# Patient Record
Sex: Female | Born: 1940
Health system: Southern US, Community
[De-identification: ages and names within clinical notes are randomized; demographics above are authoritative.]

## PROBLEM LIST (undated history)

## (undated) DIAGNOSIS — R112 Nausea with vomiting, unspecified: Secondary | ICD-10-CM

## (undated) DIAGNOSIS — Z780 Asymptomatic menopausal state: Secondary | ICD-10-CM

## (undated) DIAGNOSIS — I10 Essential (primary) hypertension: Secondary | ICD-10-CM

## (undated) DIAGNOSIS — T7840XA Allergy, unspecified, initial encounter: Secondary | ICD-10-CM

## (undated) DIAGNOSIS — M810 Age-related osteoporosis without current pathological fracture: Secondary | ICD-10-CM

## (undated) DIAGNOSIS — T8859XA Other complications of anesthesia, initial encounter: Secondary | ICD-10-CM

## (undated) DIAGNOSIS — Z9889 Other specified postprocedural states: Secondary | ICD-10-CM

## (undated) DIAGNOSIS — T4145XA Adverse effect of unspecified anesthetic, initial encounter: Secondary | ICD-10-CM

## (undated) DIAGNOSIS — E7800 Pure hypercholesterolemia, unspecified: Secondary | ICD-10-CM

## (undated) DIAGNOSIS — Z8611 Personal history of tuberculosis: Secondary | ICD-10-CM

## (undated) DIAGNOSIS — A159 Respiratory tuberculosis unspecified: Secondary | ICD-10-CM

## (undated) DIAGNOSIS — M1990 Unspecified osteoarthritis, unspecified site: Secondary | ICD-10-CM

## (undated) HISTORY — DX: Personal history of tuberculosis: Z86.11

## (undated) HISTORY — DX: Unspecified osteoarthritis, unspecified site: M19.90

## (undated) HISTORY — DX: Essential (primary) hypertension: I10

## (undated) HISTORY — PX: LUMBAR DISC SURGERY: SHX700

## (undated) HISTORY — DX: Asymptomatic menopausal state: Z78.0

## (undated) HISTORY — DX: Pure hypercholesterolemia, unspecified: E78.00

## (undated) HISTORY — DX: Allergy, unspecified, initial encounter: T78.40XA

## (undated) HISTORY — PX: SHOULDER SURGERY: SHX246

---

## 1997-12-08 ENCOUNTER — Other Ambulatory Visit: Admission: RE | Admit: 1997-12-08 | Discharge: 1997-12-08 | Payer: Self-pay | Admitting: Gynecology

## 1998-02-27 HISTORY — PX: DILATION AND CURETTAGE OF UTERUS: SHX78

## 1999-03-14 ENCOUNTER — Other Ambulatory Visit: Admission: RE | Admit: 1999-03-14 | Discharge: 1999-03-14 | Payer: Self-pay | Admitting: Gynecology

## 2000-07-02 ENCOUNTER — Other Ambulatory Visit: Admission: RE | Admit: 2000-07-02 | Discharge: 2000-07-02 | Payer: Self-pay | Admitting: Gynecology

## 2001-06-11 ENCOUNTER — Encounter: Payer: Self-pay | Admitting: Neurosurgery

## 2001-06-11 ENCOUNTER — Ambulatory Visit (HOSPITAL_COMMUNITY): Admission: RE | Admit: 2001-06-11 | Discharge: 2001-06-12 | Payer: Self-pay | Admitting: Neurosurgery

## 2001-09-02 ENCOUNTER — Other Ambulatory Visit: Admission: RE | Admit: 2001-09-02 | Discharge: 2001-09-02 | Payer: Self-pay | Admitting: Gynecology

## 2002-10-27 ENCOUNTER — Encounter (HOSPITAL_COMMUNITY): Admission: RE | Admit: 2002-10-27 | Discharge: 2002-11-26 | Payer: Self-pay | Admitting: Neurosurgery

## 2003-07-30 ENCOUNTER — Other Ambulatory Visit: Admission: RE | Admit: 2003-07-30 | Discharge: 2003-07-30 | Payer: Self-pay | Admitting: Family Medicine

## 2004-02-22 ENCOUNTER — Emergency Department (HOSPITAL_COMMUNITY): Admission: EM | Admit: 2004-02-22 | Discharge: 2004-02-22 | Payer: Self-pay | Admitting: Emergency Medicine

## 2004-06-07 ENCOUNTER — Ambulatory Visit: Payer: Self-pay | Admitting: Family Medicine

## 2004-08-08 ENCOUNTER — Other Ambulatory Visit: Admission: RE | Admit: 2004-08-08 | Discharge: 2004-08-08 | Payer: Self-pay | Admitting: Obstetrics and Gynecology

## 2004-09-23 ENCOUNTER — Ambulatory Visit: Payer: Self-pay | Admitting: Family Medicine

## 2004-09-28 ENCOUNTER — Ambulatory Visit: Payer: Self-pay | Admitting: Family Medicine

## 2005-03-21 ENCOUNTER — Ambulatory Visit: Payer: Self-pay | Admitting: Family Medicine

## 2005-03-22 ENCOUNTER — Ambulatory Visit: Payer: Self-pay | Admitting: Internal Medicine

## 2005-05-22 ENCOUNTER — Ambulatory Visit: Payer: Self-pay | Admitting: Family Medicine

## 2005-09-14 ENCOUNTER — Ambulatory Visit: Payer: Self-pay | Admitting: Family Medicine

## 2005-09-21 ENCOUNTER — Encounter: Admission: RE | Admit: 2005-09-21 | Discharge: 2005-09-21 | Payer: Self-pay | Admitting: Family Medicine

## 2005-09-21 ENCOUNTER — Ambulatory Visit: Payer: Self-pay | Admitting: Family Medicine

## 2005-10-11 ENCOUNTER — Ambulatory Visit: Payer: Self-pay | Admitting: Family Medicine

## 2006-01-09 ENCOUNTER — Ambulatory Visit: Payer: Self-pay | Admitting: Family Medicine

## 2006-01-09 LAB — CONVERTED CEMR LAB: ALT: 26 units/L (ref 0–40)

## 2006-01-11 ENCOUNTER — Ambulatory Visit: Payer: Self-pay | Admitting: Internal Medicine

## 2006-01-15 ENCOUNTER — Ambulatory Visit: Payer: Self-pay | Admitting: Family Medicine

## 2006-09-26 ENCOUNTER — Encounter (INDEPENDENT_AMBULATORY_CARE_PROVIDER_SITE_OTHER): Payer: Self-pay | Admitting: *Deleted

## 2006-10-11 DIAGNOSIS — M1990 Unspecified osteoarthritis, unspecified site: Secondary | ICD-10-CM

## 2006-12-06 ENCOUNTER — Ambulatory Visit: Payer: Self-pay | Admitting: Family Medicine

## 2006-12-06 LAB — CONVERTED CEMR LAB
Albumin: 4.2 g/dL (ref 3.5–5.2)
Alkaline Phosphatase: 54 units/L (ref 39–117)
BUN: 10 mg/dL (ref 6–23)
Basophils Absolute: 0 10*3/uL (ref 0.0–0.1)
Basophils Relative: 0.3 % (ref 0.0–1.0)
CO2: 29 meq/L (ref 19–32)
Calcium: 9.8 mg/dL (ref 8.4–10.5)
Cholesterol: 176 mg/dL (ref 0–200)
Creatinine, Ser: 0.7 mg/dL (ref 0.4–1.2)
GFR calc Af Amer: 108 mL/min
Glucose, Bld: 110 mg/dL — ABNORMAL HIGH (ref 70–99)
Glucose, Urine, Semiquant: NEGATIVE
HCT: 39.4 % (ref 36.0–46.0)
HDL: 37.9 mg/dL — ABNORMAL LOW (ref >39.0)
Monocytes Absolute: 0.3 10*3/uL (ref 0.2–0.7)
Monocytes Relative: 8 % (ref 3.0–11.0)
Neutrophils Relative %: 52.2 % (ref 43.0–77.0)
Nitrite: NEGATIVE
RDW: 13.9 % (ref 11.5–14.6)
Sodium: 141 meq/L (ref 135–145)
TSH: 3.84 microintl units/mL (ref 0.35–5.50)
Urobilinogen, UA: 0.2
VLDL: 70 mg/dL — ABNORMAL HIGH (ref 0–40)
WBC Urine, dipstick: NEGATIVE
WBC: 4.1 10*3/uL — ABNORMAL LOW (ref 4.5–10.5)

## 2006-12-13 ENCOUNTER — Ambulatory Visit: Payer: Self-pay | Admitting: Family Medicine

## 2006-12-13 DIAGNOSIS — M810 Age-related osteoporosis without current pathological fracture: Secondary | ICD-10-CM

## 2007-10-16 ENCOUNTER — Ambulatory Visit: Payer: Self-pay | Admitting: Family Medicine

## 2007-10-16 DIAGNOSIS — M7130 Other bursal cyst, unspecified site: Secondary | ICD-10-CM | POA: Insufficient documentation

## 2008-01-14 ENCOUNTER — Ambulatory Visit: Payer: Self-pay | Admitting: Family Medicine

## 2008-01-14 LAB — CONVERTED CEMR LAB
ALT: 37 units/L — ABNORMAL HIGH (ref 0–35)
AST: 31 units/L (ref 0–37)
Albumin: 4.1 g/dL (ref 3.5–5.2)
BUN: 14 mg/dL (ref 6–23)
Basophils Relative: 0 % (ref 0.0–3.0)
Bilirubin, Direct: 0.1 mg/dL (ref 0.0–0.3)
Blood in Urine, dipstick: NEGATIVE
CO2: 31 meq/L (ref 19–32)
Creatinine, Ser: 0.7 mg/dL (ref 0.4–1.2)
Eosinophils Relative: 4.4 % (ref 0.0–5.0)
Glucose, Bld: 107 mg/dL — ABNORMAL HIGH (ref 70–99)
Glucose, Urine, Semiquant: NEGATIVE
HDL: 45.1 mg/dL (ref >39.0)
MCHC: 34.5 g/dL (ref 30.0–36.0)
MCV: 82.9 fL (ref 78.0–100.0)
Neutro Abs: 2.2 10*3/uL (ref 1.4–7.7)
Neutrophils Relative %: 51.7 % (ref 43.0–77.0)
Platelets: 181 10*3/uL (ref 150–400)
Potassium: 3.9 meq/L (ref 3.5–5.1)
Sodium: 144 meq/L (ref 135–145)
TSH: 3.73 microintl units/mL (ref 0.35–5.50)
Total CHOL/HDL Ratio: 4.2
Triglycerides: 372 mg/dL (ref 0–149)
VLDL: 74 mg/dL — ABNORMAL HIGH (ref 0–40)

## 2008-01-22 ENCOUNTER — Ambulatory Visit: Payer: Self-pay | Admitting: Family Medicine

## 2008-01-22 DIAGNOSIS — J309 Allergic rhinitis, unspecified: Secondary | ICD-10-CM | POA: Insufficient documentation

## 2008-03-06 ENCOUNTER — Encounter: Payer: Self-pay | Admitting: Family Medicine

## 2009-01-29 ENCOUNTER — Ambulatory Visit: Payer: Self-pay | Admitting: Family Medicine

## 2009-01-29 LAB — CONVERTED CEMR LAB
Albumin: 4.1 g/dL (ref 3.5–5.2)
Bilirubin, Direct: 0.1 mg/dL (ref 0.0–0.3)
Blood in Urine, dipstick: NEGATIVE
CO2: 29 meq/L (ref 19–32)
Calcium: 9.3 mg/dL (ref 8.4–10.5)
Chloride: 103 meq/L (ref 96–112)
Cholesterol: 188 mg/dL (ref 0–200)
Creatinine, Ser: 0.7 mg/dL (ref 0.4–1.2)
Eosinophils Absolute: 0.1 10*3/uL (ref 0.0–0.7)
HDL: 46.7 mg/dL (ref >39.00)
Hemoglobin: 13.4 g/dL (ref 12.0–15.0)
Lymphocytes Relative: 30 % (ref 12.0–46.0)
MCV: 84.3 fL (ref 78.0–100.0)
Monocytes Absolute: 0.3 10*3/uL (ref 0.1–1.0)
Neutro Abs: 2.8 10*3/uL (ref 1.4–7.7)
Neutrophils Relative %: 61.2 % (ref 43.0–77.0)
Platelets: 192 10*3/uL (ref 150.0–400.0)
Potassium: 3.2 meq/L — ABNORMAL LOW (ref 3.5–5.1)
RBC: 4.74 M/uL (ref 3.87–5.11)
RDW: 13.7 % (ref 11.5–14.6)
Specific Gravity, Urine: 1.015
Total Protein: 8 g/dL (ref 6.0–8.3)
Urobilinogen, UA: 0.2
WBC Urine, dipstick: NEGATIVE
pH: 7.5

## 2009-02-25 ENCOUNTER — Ambulatory Visit: Payer: Self-pay | Admitting: Family Medicine

## 2010-03-14 ENCOUNTER — Other Ambulatory Visit: Payer: Self-pay | Admitting: Family Medicine

## 2010-03-14 ENCOUNTER — Ambulatory Visit
Admission: RE | Admit: 2010-03-14 | Discharge: 2010-03-14 | Payer: Self-pay | Source: Home / Self Care | Attending: Family Medicine | Admitting: Family Medicine

## 2010-03-14 LAB — LDL CHOLESTEROL, DIRECT: Direct LDL: 126.3 mg/dL

## 2010-03-14 LAB — LIPID PANEL
Cholesterol: 218 mg/dL — ABNORMAL HIGH (ref 0–200)
HDL: 55.6 mg/dL (ref >39.00)
Total CHOL/HDL Ratio: 4
Triglycerides: 156 mg/dL — ABNORMAL HIGH (ref 0.0–149.0)
VLDL: 31.2 mg/dL (ref 0.0–40.0)

## 2010-03-14 LAB — CBC WITH DIFFERENTIAL/PLATELET
Basophils Absolute: 0 10*3/uL (ref 0.0–0.1)
Basophils Relative: 0.8 % (ref 0.0–3.0)
Eosinophils Absolute: 0.1 10*3/uL (ref 0.0–0.7)
Eosinophils Relative: 1.7 % (ref 0.0–5.0)
HCT: 41 % (ref 36.0–46.0)
Hemoglobin: 13.9 g/dL (ref 12.0–15.0)
Lymphocytes Relative: 30.4 % (ref 12.0–46.0)
Lymphs Abs: 1.8 10*3/uL (ref 0.7–4.0)
MCHC: 33.8 g/dL (ref 30.0–36.0)
MCV: 83.5 fl (ref 78.0–100.0)
Monocytes Absolute: 0.3 10*3/uL (ref 0.1–1.0)
Monocytes Relative: 5.6 % (ref 3.0–12.0)
Neutro Abs: 3.6 10*3/uL (ref 1.4–7.7)
Neutrophils Relative %: 61.5 % (ref 43.0–77.0)
Platelets: 196 10*3/uL (ref 150.0–400.0)
RBC: 4.9 Mil/uL (ref 3.87–5.11)
RDW: 14.8 % — ABNORMAL HIGH (ref 11.5–14.6)
WBC: 5.8 10*3/uL (ref 4.5–10.5)

## 2010-03-14 LAB — HEPATIC FUNCTION PANEL
ALT: 29 U/L (ref 0–35)
AST: 28 U/L (ref 0–37)
Albumin: 4.4 g/dL (ref 3.5–5.2)
Alkaline Phosphatase: 59 U/L (ref 39–117)
Bilirubin, Direct: 0.1 mg/dL (ref 0.0–0.3)
Total Bilirubin: 1 mg/dL (ref 0.3–1.2)
Total Protein: 7.8 g/dL (ref 6.0–8.3)

## 2010-03-14 LAB — BASIC METABOLIC PANEL
BUN: 17 mg/dL (ref 6–23)
CO2: 29 mEq/L (ref 19–32)
Calcium: 9.6 mg/dL (ref 8.4–10.5)
Chloride: 100 mEq/L (ref 96–112)
Creatinine, Ser: 0.8 mg/dL (ref 0.4–1.2)
GFR: 75.33 mL/min (ref >60.00)
Glucose, Bld: 110 mg/dL — ABNORMAL HIGH (ref 70–99)
Potassium: 4.2 mEq/L (ref 3.5–5.1)
Sodium: 138 mEq/L (ref 135–145)

## 2010-03-14 LAB — TSH: TSH: 2.43 u[IU]/mL (ref 0.35–5.50)

## 2010-03-14 LAB — CONVERTED CEMR LAB
Protein, U semiquant: NEGATIVE
Specific Gravity, Urine: 1.015
Urobilinogen, UA: 0.2

## 2010-03-29 ENCOUNTER — Ambulatory Visit
Admission: RE | Admit: 2010-03-29 | Discharge: 2010-03-29 | Payer: Self-pay | Source: Home / Self Care | Attending: Family Medicine | Admitting: Family Medicine

## 2010-03-29 ENCOUNTER — Encounter: Payer: Self-pay | Admitting: Family Medicine

## 2010-04-06 NOTE — Assessment & Plan Note (Signed)
Summary: cpx//ccm   Vital Signs:  Patient profile:   70 year old female Height:      61.75 inches Weight:      116 pounds BMI:     21.47 Temp:     98.1 degrees F oral BP sitting:   108 / 78  (left arm) Cuff size:   regular  Vitals Entered By: Kern Reap CMA Duncan Dull) (March 29, 2010 2:05 PM) CC: wellness exam   CC:  wellness exam.  History of Present Illness: Cassidy Bennett is a 70 year old, married female, nonsmoker, who comes in today for the Medicare wellness examination,  She's always been in excellent, health.  She's had no chronic health problems.  She takes one fourth of a 25-mg atenolol tablet.  BP 106/78.  I think she can probably stop her medication.  She is in the process of weaning off her hormones.  She's been on them for many years.  This is being given to her by her GYN.  I recommend 3 years and off, however, she's continued as under the direction of her GYN.  She has routine eye care, dental care, does not check her breasts monthly, annual mammography, colonoscopy, normal 7 years ago, tetanus, 2009, Pneumovax 2007, seasonal flu 2011  Here for Medicare AWV:  71   Risk factors based on Past M, S, F history:...reviewed no changes 2.   Physical Activities: walks daily 3.   Depression/mood: good mood.  No depression 4.   Hearing: normal 5.   ADL's: functions independently 6.   Fall Risk: reviewed.  None identified 7.   Home Safety: no guns in the house 8.   Height, weight, &visual acuity:height weight, vision normal 9.   Counseling: continue good health habits 10.   Labs ordered based on risk factors: reviewed all labs normal 11.           Referral Coordination.......none indicated 12.           Care Plan....Marland Kitchenfollow-up in one year stop the Tenoretic 13.            Cognitive Assessment ...Marland KitchenMarland KitchenMarland Kitchenoriented x 3, and does all her own finances independently  Allergies: No Known Drug Allergies  Past History:  Past medical, surgical, family and social histories (including  risk factors) reviewed, and no changes noted (except as noted below).  Past Medical History: Reviewed history from 01/22/2008 and no changes required. Hypertension Osteoarthritis Post Menopausal Allergic rhinitis  Past Surgical History: Reviewed history from 10/11/2006 and no changes required. Childbirth x 4 Lumbar HNP Rt shoulder sx D&C-1994 Colonoscopy-09/11/2003  Family History: Reviewed history from 10/11/2006 and no changes required. Family History Other cancer-Stomach Brother-suicide  Social History: Reviewed history from 10/11/2006 and no changes required. Married Never Smoked Alcohol use-no Drug use-no Regular exercise-yes Retired  Review of Systems      See HPI  Physical Exam  General:  Well-developed,well-nourished,in no acute distress; alert,appropriate and cooperative throughout examination Head:  Normocephalic and atraumatic without obvious abnormalities. No apparent alopecia or balding. Eyes:  No corneal or conjunctival inflammation noted. EOMI. Perrla. Funduscopic exam benign, without hemorrhages, exudates or papilledema. Vision grossly normal. Ears:  External ear exam shows no significant lesions or deformities.  Otoscopic examination reveals clear canals, tympanic membranes are intact bilaterally without bulging, retraction, inflammation or discharge. Hearing is grossly normal bilaterally. Nose:  External nasal examination shows no deformity or inflammation. Nasal mucosa are pink and moist without lesions or exudates. Mouth:  Oral mucosa and oropharynx without lesions or exudates.  Teeth  in good repair. Neck:  No deformities, masses, or tenderness noted. Chest Wall:  No deformities, masses, or tenderness noted. Breasts:  No mass, nodules, thickening, tenderness, bulging, retraction, inflamation, nipple discharge or skin changes noted.   Lungs:  Normal respiratory effort, chest expands symmetrically. Lungs are clear to auscultation, no crackles or  wheezes. Heart:  Normal rate and regular rhythm. S1 and S2 normal without gallop, murmur, click, rub or other extra sounds. Abdomen:  Bowel sounds positive,abdomen soft and non-tender without masses, organomegaly or hernias noted. Msk:  No deformity or scoliosis noted of thoracic or lumbar spine.   Pulses:  R and L carotid,radial,femoral,dorsalis pedis and posterior tibial pulses are full and equal bilaterally Extremities:  No clubbing, cyanosis, edema, or deformity noted with normal full range of motion of all joints.   Neurologic:  No cranial nerve deficits noted. Station and gait are normal. Plantar reflexes are down-going bilaterally. DTRs are symmetrical throughout. Sensory, motor and coordinative functions appear intact. Skin:  Intact without suspicious lesions or rashes Cervical Nodes:  No lymphadenopathy noted Axillary Nodes:  No palpable lymphadenopathy Inguinal Nodes:  No significant adenopathy Psych:  Cognition and judgment appear intact. Alert and cooperative with normal attention span and concentration. No apparent delusions, illusions, hallucinations   Impression & Recommendations:  Problem # 1:  HYPERTENSION (ICD-401.9) Assessment Improved  Her updated medication list for this problem includes:    Atenolol 25 Mg Tabs (Atenolol) .Marland Kitchen... 1/2 qam  Orders: Medicare -1st Annual Wellness Visit [phone])  Problem # 2:  Preventive Health Care (ICD-V70.0) Assessment: Unchanged  Complete Medication List: 1)  Prempro 0.3-1.5 Mg Tabs (Conj estrog-medroxyprogest ace) .... Take one tab 3 times a week 2)  Atenolol 25 Mg Tabs (Atenolol) .... 1/2 qam  Patient Instructions: 1)  stop the . atenolol, completely. 2)  Check your blood pressure daily for 4 weeks and fax me the data at [phone]. 3)  Do a thorough breast exam monthly 4)  Please schedule a follow-up appointment in 1 year. 5)  It is important that you exercise regularly at least 20 minutes 5 times a week. If you develop chest  pain, have severe difficulty breathing, or feel very tired , stop exercising immediately and seek medical attention. 6)  Schedule your mammogram. 7)  Schedule a colonoscopy/sigmoidoscopy to help detect colon cancer. 8)  Take calcium +Vitamin D daily.   Orders Added: 1)  Medicare -1st Annual Wellness Visit [G0438]     Appended Document: Orders Update    Clinical Lists Changes  Orders: Added new Service order of EKG w/ Interpretation (93000) - Signed

## 2010-07-15 NOTE — Op Note (Signed)
[place]. [facility]  Patient:    ANWITA, MENCER Visit Number: [id number] MRN: [id number]          Service Type: DSU Location: 3000 3013 01 Attending Physician:  Danella Penton Dictated by:   Tanya Nones. Jeral Fruit, M.D. Proc. 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  PROCEDURE:  Left L5-S1 diskectomy, microdissection, Metrx system.  SURGEON:  Tanya Nones. Jeral Fruit, M.D.  ASSISTANT:  Cristi Loron, M.D.  CLINICAL HISTORY:  The patient was admitted because of back and left leg pain. All of this started back in February, getting worse.  MRI showed showed that she has a herniated disk at L5-1, displacing on the thecal sac.  Since she had the MRI, the patient got worse.  Now, she has about 2/5 weakness of the left foot and absent left ankle jerk. Surgery was advised.  The risks were explained in the history and physical.  PROCEDURE:  The patient was taken to the OR, and she was ______. The back was prepped with Betadine.  Then with the help of the C-arm with a needle, we identified the L5-S1 joint on the left side.  Then, an incision was made in the skin through the fascia.  Using a serial dilator, we were able to introduce a 4 cm dilator into the L5-S1 space.  Having done this, we identified the lamina of 5 and the upper part of S1.  Both of them were drilled.  Then with the microscope with microdissection, removing the ligament. With one more lateral x-ray, which showed that were at L5-1. Then, we found that S1 nerve root was swollen and displaced posteriorly.  Retraction was made, and indeed there was a fragment going into the body of S1.  Incision was made, removing the fragment was accomplished, and we entered into the disk space with a total gross  diskectomy, medially and laterally was achieved.  At the ______ of S1 and L5, S1 as well at the midline was negative.  From there on, Valsalva maneuver was negative. The area was irrigated.  Fentanyl and Depo-Medrol were left in the epidural space, and the wound was closed with Vicryl and Steri-Strips. Dictated by:   Tanya Nones. Jeral Fruit, M.D. Attending Physician:  Danella Penton DD:  06/11/01 TD:  06/11/01 Job: [id number]

## 2010-07-15 NOTE — H&P (Signed)
[place]. [facility]  Patient:    Cassidy Bennett, Cassidy Bennett Visit Number: [id number] MRN: [id number]          Service Type: DSU Location: 3000 3013 01 Attending Physician:  Danella Penton Dictated by:   Tanya Nones. Jeral Fruit, M.D. Admit Date:  06/11/2001 Discharge Date: 06/12/2001                           History and Physical  HISTORY OF PRESENT ILLNESS:  The patient is a lady who was seen by me a few days ago in my office because of back pain with radiation down to her left leg which was getting worse. She said that back in February of this year she was carrying a vacuum upstairs and then suddenly she developed back pain that went posterolaterally to the left foot.  She is not any better despite conservative treatment.  The patient had an epidural injection being placed by a chiropractor and still it is worse.  An MRI showed a herniated disk at L5-S1. The patient denies any pain in the right leg or any problems with bladder or bowel.  PAST MEDICAL HISTORY:  Cesarean section.  ALLERGIES:  She is not allergic to any medications.  SOCIAL HISTORY:  Negative.  FAMILY HISTORY:  Negative.  REVIEW OF SYSTEMS:  Positive for back and left leg pain.  PHYSICAL EXAMINATION:  GENERAL:  The patient presented to my office with her husband.  She was walking with short steps and she was quite miserable.  She does have difficulty sitting or standing.  HEAD:  Nose and throat are normal.  Neck is normal.  LUNGS:  Clear.  HEART:  Heart sounds are normal.  ABDOMEN:  Normal.  EXTREMITIES:  Normal pulses.  NEUROLOGIC:  Mental status normal.  Cranial nerves normal.  Strength is 5/5 except in the left foot which is 3/5 with dorsiflexion and 1/5 of plantar flexion.  There is no atrophy or fasciculation.  Reflexes are symmetrical on the left. Ankle jerk is negative in relation to the right side.  Sensation shows a degree of numbness which involved the S1 nerve  root.  Straight leg raising on the left side side is positive at 60 degrees, right side 80 degrees.  She has a high positive sciatic notch tenderness on the left side.  The MRI showed that she has some degenerative disk disease at L5-S1 with mild facet arthropathy with a herniated disk at L5-S1 displacing the thecal sac.  IMPRESSION:  Left L5-S1 radiculopathy secondary to L5-S1 herniated disc.  RECOMMENDATION:  The patient will be admitted for surgery. She is going to have an L5-S1 diskectomy and foraminotomy.  She knows of the risks of the surgery such as no improvement, need for further surgery, infection, CSF leak, damage to the vessels of the abdomen, and the need for further surgery.  The patient declined another opinion. Dictated by:   Tanya Nones. Jeral Fruit, M.D. Attending Physician:  Danella Penton DD:  06/11/01 TD:  06/11/01 Job: [phone]

## 2010-11-16 ENCOUNTER — Encounter: Payer: Self-pay | Admitting: Family Medicine

## 2010-11-16 ENCOUNTER — Ambulatory Visit (INDEPENDENT_AMBULATORY_CARE_PROVIDER_SITE_OTHER): Payer: BC Managed Care – PPO | Admitting: Family Medicine

## 2010-11-16 VITALS — BP 120/80 | Temp 98.7°F | Wt 113.0 lb

## 2010-11-16 DIAGNOSIS — R1031 Right lower quadrant pain: Secondary | ICD-10-CM

## 2010-11-16 DIAGNOSIS — Z23 Encounter for immunization: Secondary | ICD-10-CM

## 2010-11-16 NOTE — Patient Instructions (Signed)
Called Dr. Jorja Loa , [facility] for evaluation of your cataracts  Motrin 600 mg twice daily for soreness in the right lower quadrant of the abdomen

## 2010-11-16 NOTE — Progress Notes (Signed)
  Subjective:    Patient ID: Cassidy Bennett, female    DOB: [birth year], 70 y.o.   MRN: [id number]  HPIChristine is a 70 year old, married female, nonsmoker, who comes in today for evaluation of right lower quadrant abdominal pain.  She states that about two months ago.  She began having soreness in the right lower quadrant.  It comes and goes.  She wonders if she might have a hernia.  She's had no nausea, vomiting, diarrhea, change in urinary nor bowel habits.  She describes the pain as a dull ache and she points to her right hip as a source of her discomfort.  She is also due to have cataract surgery.  However, she needs an eye doctor in [place].  Recommend Dr. Roque Cash.  She stopped her Tenoretic and her blood pressure remains normal.  120/80.  She also finally stopped.  The HRT.    Review of Systems General and GI review of systems otherwise negative    Objective:   Physical Exam  Thin female, in no acute distress.  Examination the abdomen the abdomen is soft.  Bowel sounds are normal.  No palpable tenderness.  No masses.  There is a scar in the midline from previous C-sections.  There is some tenderness in the right lower quadrant abdominal muscles........... She has been caring for her grandchild who is increasing in weight over the past couple months..... And she's doing a lot of lifting...Marland KitchenMarland KitchenMarland Kitchen      Assessment & Plan:  Right lower quadrant abdominal pain, probable muscle strain.  Plan Motrin, 600 b.i.d., p.r.n.  Bilateral cataracts refer to Dr.  Vonna Kotyk

## 2010-12-26 ENCOUNTER — Ambulatory Visit (INDEPENDENT_AMBULATORY_CARE_PROVIDER_SITE_OTHER): Payer: BC Managed Care – PPO | Admitting: Family Medicine

## 2010-12-26 ENCOUNTER — Encounter: Payer: Self-pay | Admitting: Family Medicine

## 2010-12-26 DIAGNOSIS — M1990 Unspecified osteoarthritis, unspecified site: Secondary | ICD-10-CM

## 2010-12-26 DIAGNOSIS — J069 Acute upper respiratory infection, unspecified: Secondary | ICD-10-CM

## 2010-12-26 MED ORDER — HYDROCODONE-HOMATROPINE 5-1.5 MG/5ML PO SYRP: ORAL_SOLUTION | ORAL | Status: DC

## 2010-12-26 NOTE — Progress Notes (Signed)
  Subjective:    Patient ID: Cassidy Bennett, female    DOB: 07-Jul-1940, 70 y.o.   MRN: [id number]  HPIChristine is a 70 year old, married female, nonsmoker, who comes in today for evaluation of a cough and joint pain.  Past, week she's had a nonproductive cough.  No fever.  Review of systems otherwise negative.  She has had a history of osteoarthritis.  Recently, she had some swelling of her knees and, ankles.  It's gone today    Review of Systems    General pulmonary and orthopedic review of systems otherwise negative Objective:   Physical Exam  Well-developed well-nourished, female, no acute distress.  HEENT negative.  Neck was supple.  Lungs were clear.  Musculoskeletal exam normal.  Specifically, no redness, swelling of her knees or ankles.      Assessment & Plan:  Viral syndrome, transient plan treat symptomatically with lots of liquids and Hydromet cough syrup.  Osteoarthritis.  Motrin, 600 mg b.i.d., p.r.n.

## 2010-12-26 NOTE — Patient Instructions (Signed)
Drink lots of water.  Hydromet one half or 1 teaspoon at bedtime for cough and cold.  Motrin 600 mg twice daily with food  when you have a flareup of your arthritis

## 2011-03-27 ENCOUNTER — Other Ambulatory Visit (INDEPENDENT_AMBULATORY_CARE_PROVIDER_SITE_OTHER): Payer: BC Managed Care – PPO

## 2011-03-27 DIAGNOSIS — Z0000 Encounter for general adult medical examination without abnormal findings: Secondary | ICD-10-CM

## 2011-03-27 LAB — CBC WITH DIFFERENTIAL/PLATELET
Basophils Relative: 0.7 % (ref 0.0–3.0)
Lymphocytes Relative: 28.8 % (ref 12.0–46.0)
MCHC: 34.2 g/dL (ref 30.0–36.0)
Monocytes Absolute: 0.3 10*3/uL (ref 0.1–1.0)
RDW: 15.4 % — ABNORMAL HIGH (ref 11.5–14.6)

## 2011-03-27 LAB — POCT URINALYSIS DIPSTICK
Bilirubin, UA: NEGATIVE
Blood, UA: NEGATIVE
Protein, UA: NEGATIVE

## 2011-03-27 LAB — LDL CHOLESTEROL, DIRECT: Direct LDL: 119.7 mg/dL

## 2011-03-27 LAB — HEPATIC FUNCTION PANEL
AST: 24 U/L (ref 0–37)
Bilirubin, Direct: 0.1 mg/dL (ref 0.0–0.3)
Total Protein: 7.7 g/dL (ref 6.0–8.3)

## 2011-03-27 LAB — BASIC METABOLIC PANEL
BUN: 11 mg/dL (ref 6–23)
CO2: 25 mEq/L (ref 19–32)
Creatinine, Ser: 0.6 mg/dL (ref 0.4–1.2)
GFR: 98.95 mL/min (ref >60.00)
Potassium: 3.8 mEq/L (ref 3.5–5.1)

## 2011-03-27 LAB — LIPID PANEL: VLDL: 43.2 mg/dL — ABNORMAL HIGH (ref 0.0–40.0)

## 2011-03-27 LAB — TSH: TSH: 2.95 u[IU]/mL (ref 0.35–5.50)

## 2011-04-03 ENCOUNTER — Encounter: Payer: Self-pay | Admitting: Family Medicine

## 2011-04-03 ENCOUNTER — Ambulatory Visit (INDEPENDENT_AMBULATORY_CARE_PROVIDER_SITE_OTHER): Payer: BC Managed Care – PPO | Admitting: Family Medicine

## 2011-04-03 DIAGNOSIS — J309 Allergic rhinitis, unspecified: Secondary | ICD-10-CM

## 2011-04-03 DIAGNOSIS — Z0000 Encounter for general adult medical examination without abnormal findings: Secondary | ICD-10-CM

## 2011-04-03 DIAGNOSIS — I10 Essential (primary) hypertension: Secondary | ICD-10-CM

## 2011-04-03 DIAGNOSIS — R05 Cough: Secondary | ICD-10-CM

## 2011-04-03 NOTE — Progress Notes (Signed)
  Subjective:    Patient ID: Cassidy Bennett, female    DOB: 10/10/1940, 71 y.o.   MRN: [id number]  HPI Cassidy Bennett  is a 71 year old married female nonsmoker who comes in today for Medicare wellness examination because of a history of hypertension  She has stopped taking her 12.5 mg of atenolol daily. BP normal 130/90  She's now off the hormones completely. She takes an aspirin tablet daily.  She gets routine eye care, hearing normal, regular dental care, BSE monthly, and you mammography, colonoscopy 9 years ago normal, tetanus booster 2009, shingles 2012, seasonal flu shot 2012, Pneumovax x2   cognitive function normal she walks on a regular basis. She lives with her husband they're retired and they managed her own financial affairs. Home health safety reviewed no issues identified, no guns in the house, they do have a health care power of attorney and living will.    Review of Systems  Constitutional: Negative.   HENT: Negative.   Eyes: Negative.   Respiratory: Negative.   Cardiovascular: Negative.   Gastrointestinal: Negative.   Genitourinary: Negative.   Musculoskeletal: Negative.   Neurological: Negative.   Hematological: Negative.   Psychiatric/Behavioral: Negative.        Objective:   Physical Exam  Constitutional: She appears well-developed and well-nourished.  HENT:  Head: Normocephalic and atraumatic.  Right Ear: External ear normal.  Left Ear: External ear normal.  Nose: Nose normal.  Mouth/Throat: Oropharynx is clear and moist.  Eyes: EOM are normal. Pupils are equal, round, and reactive to light.  Neck: Normal range of motion. Neck supple. No thyromegaly present.  Cardiovascular: Normal rate, regular rhythm, normal heart sounds and intact distal pulses.  Exam reveals no gallop and no friction rub.   No murmur heard. Pulmonary/Chest: Effort normal and breath sounds normal.  Abdominal: Soft. Bowel sounds are normal. She exhibits no distension and no mass.  There is no tenderness. There is no rebound.  Genitourinary:       Bilateral breast exam normal  Musculoskeletal: Normal range of motion.  Lymphadenopathy:    She has no cervical adenopathy.  Neurological: She is alert. She has normal reflexes. No cranial nerve deficit. She exhibits normal muscle tone. Coordination normal.  Skin: Skin is warm and dry.  Psychiatric: She has a normal mood and affect. Her behavior is normal. Judgment and thought content normal.          Assessment & Plan:  Healthy female  Normal blood pressure off medication  Postmenopausal not requiring any hormonal supplement. Offered Premarin vaginal cream she declined.

## 2011-04-03 NOTE — Patient Instructions (Signed)
Continue your good health habits   I would recommend  Motrin 600 mg twice daily with food for your joint pain 6 Return in one year sooner if any problems

## 2011-08-04 DIAGNOSIS — M23305 Other meniscus derangements, unspecified medial meniscus, unspecified knee: Secondary | ICD-10-CM | POA: Diagnosis not present

## 2011-08-07 DIAGNOSIS — IMO0002 Reserved for concepts with insufficient information to code with codable children: Secondary | ICD-10-CM | POA: Diagnosis not present

## 2011-08-10 DIAGNOSIS — M25569 Pain in unspecified knee: Secondary | ICD-10-CM | POA: Diagnosis not present

## 2011-08-17 DIAGNOSIS — M25569 Pain in unspecified knee: Secondary | ICD-10-CM | POA: Diagnosis not present

## 2011-08-21 DIAGNOSIS — M25569 Pain in unspecified knee: Secondary | ICD-10-CM | POA: Diagnosis not present

## 2011-08-24 DIAGNOSIS — M25569 Pain in unspecified knee: Secondary | ICD-10-CM | POA: Diagnosis not present

## 2011-08-28 DIAGNOSIS — M25569 Pain in unspecified knee: Secondary | ICD-10-CM | POA: Diagnosis not present

## 2011-09-11 DIAGNOSIS — M25569 Pain in unspecified knee: Secondary | ICD-10-CM | POA: Diagnosis not present

## 2011-09-18 DIAGNOSIS — M25569 Pain in unspecified knee: Secondary | ICD-10-CM | POA: Diagnosis not present

## 2011-09-21 DIAGNOSIS — M25569 Pain in unspecified knee: Secondary | ICD-10-CM | POA: Diagnosis not present

## 2011-09-25 DIAGNOSIS — M25569 Pain in unspecified knee: Secondary | ICD-10-CM | POA: Diagnosis not present

## 2011-09-28 DIAGNOSIS — M25569 Pain in unspecified knee: Secondary | ICD-10-CM | POA: Diagnosis not present

## 2011-10-02 DIAGNOSIS — M23305 Other meniscus derangements, unspecified medial meniscus, unspecified knee: Secondary | ICD-10-CM | POA: Diagnosis not present

## 2011-11-16 DIAGNOSIS — M1710 Unilateral primary osteoarthritis, unspecified knee: Secondary | ICD-10-CM | POA: Diagnosis not present

## 2012-01-15 DIAGNOSIS — Z23 Encounter for immunization: Secondary | ICD-10-CM | POA: Diagnosis not present

## 2012-01-19 DIAGNOSIS — M1710 Unilateral primary osteoarthritis, unspecified knee: Secondary | ICD-10-CM | POA: Diagnosis not present

## 2012-02-28 HISTORY — PX: KNEE ARTHROSCOPY: SUR90

## 2012-04-17 DIAGNOSIS — M1710 Unilateral primary osteoarthritis, unspecified knee: Secondary | ICD-10-CM | POA: Diagnosis not present

## 2012-04-25 DIAGNOSIS — M1710 Unilateral primary osteoarthritis, unspecified knee: Secondary | ICD-10-CM | POA: Diagnosis not present

## 2012-05-02 DIAGNOSIS — M1710 Unilateral primary osteoarthritis, unspecified knee: Secondary | ICD-10-CM | POA: Diagnosis not present

## 2012-06-07 DIAGNOSIS — M1710 Unilateral primary osteoarthritis, unspecified knee: Secondary | ICD-10-CM | POA: Diagnosis not present

## 2012-06-07 DIAGNOSIS — IMO0002 Reserved for concepts with insufficient information to code with codable children: Secondary | ICD-10-CM | POA: Diagnosis not present

## 2012-06-10 ENCOUNTER — Other Ambulatory Visit (INDEPENDENT_AMBULATORY_CARE_PROVIDER_SITE_OTHER): Payer: Medicare Other

## 2012-06-10 DIAGNOSIS — Z0000 Encounter for general adult medical examination without abnormal findings: Secondary | ICD-10-CM | POA: Diagnosis not present

## 2012-06-10 DIAGNOSIS — I10 Essential (primary) hypertension: Secondary | ICD-10-CM

## 2012-06-10 DIAGNOSIS — E785 Hyperlipidemia, unspecified: Secondary | ICD-10-CM | POA: Diagnosis not present

## 2012-06-10 DIAGNOSIS — E039 Hypothyroidism, unspecified: Secondary | ICD-10-CM

## 2012-06-10 LAB — BASIC METABOLIC PANEL
BUN: 13 mg/dL (ref 6–23)
GFR: 117.81 mL/min (ref >60.00)
Potassium: 3.9 mEq/L (ref 3.5–5.1)
Sodium: 141 mEq/L (ref 135–145)

## 2012-06-10 LAB — CBC WITH DIFFERENTIAL/PLATELET
Basophils Absolute: 0 10*3/uL (ref 0.0–0.1)
Eosinophils Relative: 2.1 % (ref 0.0–5.0)
Hemoglobin: 12.9 g/dL (ref 12.0–15.0)
Lymphocytes Relative: 33.1 % (ref 12.0–46.0)
MCHC: 33.6 g/dL (ref 30.0–36.0)
MCV: 80.2 fl (ref 78.0–100.0)
Monocytes Absolute: 0.3 10*3/uL (ref 0.1–1.0)
Neutro Abs: 2.5 10*3/uL (ref 1.4–7.7)
Neutrophils Relative %: 57 % (ref 43.0–77.0)
RBC: 4.8 Mil/uL (ref 3.87–5.11)
RDW: 14.8 % — ABNORMAL HIGH (ref 11.5–14.6)

## 2012-06-10 LAB — HEPATIC FUNCTION PANEL
Albumin: 4.2 g/dL (ref 3.5–5.2)
Bilirubin, Direct: 0.1 mg/dL (ref 0.0–0.3)

## 2012-06-10 LAB — LIPID PANEL
HDL: 45 mg/dL (ref >39.00)
Total CHOL/HDL Ratio: 4
Triglycerides: 206 mg/dL — ABNORMAL HIGH (ref 0.0–149.0)

## 2012-06-10 LAB — POCT URINALYSIS DIPSTICK: Nitrite, UA: NEGATIVE

## 2012-06-17 ENCOUNTER — Encounter: Payer: Self-pay | Admitting: Family Medicine

## 2012-06-17 ENCOUNTER — Ambulatory Visit (INDEPENDENT_AMBULATORY_CARE_PROVIDER_SITE_OTHER): Payer: Medicare Other | Admitting: Family Medicine

## 2012-06-17 ENCOUNTER — Ambulatory Visit (INDEPENDENT_AMBULATORY_CARE_PROVIDER_SITE_OTHER)
Admission: RE | Admit: 2012-06-17 | Discharge: 2012-06-17 | Disposition: A | Discharge status: Home / Self Care | Payer: Medicare Other | Source: Ambulatory Visit | Attending: Family Medicine | Admitting: Family Medicine

## 2012-06-17 VITALS — BP 122/70 | HR 84 | Temp 98.2°F | Ht 61.5 in | Wt 114.0 lb

## 2012-06-17 DIAGNOSIS — I10 Essential (primary) hypertension: Secondary | ICD-10-CM

## 2012-06-17 DIAGNOSIS — Z8611 Personal history of tuberculosis: Secondary | ICD-10-CM

## 2012-06-17 DIAGNOSIS — J309 Allergic rhinitis, unspecified: Secondary | ICD-10-CM

## 2012-06-17 DIAGNOSIS — Z23 Encounter for immunization: Secondary | ICD-10-CM

## 2012-06-17 DIAGNOSIS — Z0000 Encounter for general adult medical examination without abnormal findings: Secondary | ICD-10-CM

## 2012-06-17 NOTE — Patient Instructions (Signed)
Go to the main office today for a chest x-ray  Take an aspirin tablet daily  For general musculoskeletal aches and pains you could safely take Motrin 400 mg twice daily with food  Followup in 1 year sooner if any problems

## 2012-06-17 NOTE — Progress Notes (Signed)
  Subjective:    Patient ID: Cassidy Bennett, female    DOB: 1940/06/27, 72 y.o.   MRN: [id number]  HPIChristine is a 72 year old married female nonsmoker who comes in today for a general Medicare wellness examination  She did have a history of hypertension and was on Tenormin 12.5 medical milligrams daily however her blood pressure is 122/70 on no medication  She was on hormone replacement therapy which we stopped.  She states overall she feels well she occasionally has a sensation of dizziness that comes and goes some soreness in her arm and leg becoming and she had a cartilage repair left knee April 2013 by Dr. Serita Sheller.  She gets routine eye care, dental care, BSE monthly, and you mammography, colonoscopy up-to-date, vaccinations up-to-date except she needs a Pneumovax. Shingles 2013 at another office  She states 73 years ago she had TB and would like a chest x-ray she was treated with medication she does not recall what it was.  Cognitive function normal she walks on regular basis home safety reviewed no issues identified, no guns in the house, she does have a health care power of attorney and living well    Review of Systems  Constitutional: Negative.   HENT: Negative.   Eyes: Negative.   Respiratory: Negative.   Cardiovascular: Negative.   Gastrointestinal: Negative.   Genitourinary: Negative.   Musculoskeletal: Negative.   Neurological: Negative.   Psychiatric/Behavioral: Negative.        Objective:   Physical Exam  Constitutional: She appears well-developed and well-nourished.  HENT:  Head: Normocephalic and atraumatic.  Right Ear: External ear normal.  Left Ear: External ear normal.  Nose: Nose normal.  Mouth/Throat: Oropharynx is clear and moist.  Eyes: EOM are normal. Pupils are equal, round, and reactive to light.  Neck: Normal range of motion. Neck supple. No thyromegaly present.  Cardiovascular: Normal rate, regular rhythm, normal heart sounds and intact  distal pulses.  Exam reveals no gallop and no friction rub.   No murmur heard. No carotid or aortic bruits  Pulmonary/Chest: Effort normal and breath sounds normal.  Abdominal: Soft. Bowel sounds are normal. She exhibits no distension and no mass. There is no tenderness. There is no rebound.  Genitourinary:  Bilateral breast exam normal  Pelvic and Pap last year normal recommended every 3 years pelvic and Pap  Musculoskeletal: Normal range of motion.  Lymphadenopathy:    She has no cervical adenopathy.  Neurological: She is alert. She has normal reflexes. No cranial nerve deficit. She exhibits normal muscle tone. Coordination normal.  Skin: Skin is warm and dry.  Psychiatric: She has a normal mood and affect. Her behavior is normal. Judgment and thought content normal.          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 Serita Sheller followup with him when necessary

## 2012-07-31 DIAGNOSIS — H538 Other visual disturbances: Secondary | ICD-10-CM | POA: Diagnosis not present

## 2012-07-31 DIAGNOSIS — H2510 Age-related nuclear cataract, unspecified eye: Secondary | ICD-10-CM | POA: Diagnosis not present

## 2012-08-08 DIAGNOSIS — Z1231 Encounter for screening mammogram for malignant neoplasm of breast: Secondary | ICD-10-CM | POA: Diagnosis not present

## 2012-08-08 DIAGNOSIS — Z1289 Encounter for screening for malignant neoplasm of other sites: Secondary | ICD-10-CM | POA: Diagnosis not present

## 2012-09-02 DIAGNOSIS — Z780 Asymptomatic menopausal state: Secondary | ICD-10-CM | POA: Diagnosis not present

## 2012-12-04 DIAGNOSIS — M1710 Unilateral primary osteoarthritis, unspecified knee: Secondary | ICD-10-CM | POA: Diagnosis not present

## 2012-12-06 DIAGNOSIS — Z23 Encounter for immunization: Secondary | ICD-10-CM | POA: Diagnosis not present

## 2013-01-17 DIAGNOSIS — M1710 Unilateral primary osteoarthritis, unspecified knee: Secondary | ICD-10-CM | POA: Diagnosis not present

## 2013-01-27 DIAGNOSIS — M1710 Unilateral primary osteoarthritis, unspecified knee: Secondary | ICD-10-CM | POA: Diagnosis not present

## 2013-02-06 DIAGNOSIS — M1710 Unilateral primary osteoarthritis, unspecified knee: Secondary | ICD-10-CM | POA: Diagnosis not present

## 2013-02-27 HISTORY — PX: COLONOSCOPY: SHX174

## 2013-06-13 ENCOUNTER — Encounter: Payer: Self-pay | Admitting: Physician Assistant

## 2013-06-13 ENCOUNTER — Ambulatory Visit (INDEPENDENT_AMBULATORY_CARE_PROVIDER_SITE_OTHER): Payer: Medicare Other | Admitting: Physician Assistant

## 2013-06-13 VITALS — BP 160/70 | HR 85 | Temp 97.7°F | Resp 16 | Ht 61.75 in | Wt 117.8 lb

## 2013-06-13 DIAGNOSIS — B309 Viral conjunctivitis, unspecified: Secondary | ICD-10-CM

## 2013-06-13 DIAGNOSIS — J309 Allergic rhinitis, unspecified: Secondary | ICD-10-CM | POA: Diagnosis not present

## 2013-06-13 NOTE — Progress Notes (Signed)
Subjective:    Patient ID: Cassidy Bennett, female    DOB: 12-Dec-1940, 73 y.o.   MRN: [id number]  HPI Patient is a 73 year old Asian female presenting to the clinic today for sinus congestion and eye irritation. Patient states that she began experiencing symptoms Saturday after having babysat her grandson last Thursday. Her grandson last week was diagnosed with viral conjunctivitis, and the patient feels as though she has now contracted this. She complains of bilateral eye irritation with some yellowish to clear to be discharged from the right eye. She has also been experiencing some headache, and cough that she describes as "clearing" her throat, which produces some thin yellow mucus. She also states that she awoke this morning with some ear fullness but has not experienced any draining. She also denies postnasal draining but admits to a tickle throat sensation causing her to clear her throat. She has taken Robitussin-DM for symptoms as well as Mucinex these have provided her with mild relief. She denies fevers, chills, nausea, vomiting, and diarrhea, and shortness of breath.   Review of Systems As per history of present illness and are otherwise negative.  Past Medical History  Diagnosis Date  . Hypertension   . Osteoarthritis   . Post-menopausal   . Allergy    Past Surgical History  Procedure Laterality Date  . Lumbar disc surgery    . Shoulder surgery      right  . Dilation and curettage of uterus      reports that she has never smoked. She does not have any smokeless tobacco history on file. She reports that she does not drink alcohol or use illicit drugs. family history includes Cancer in her other. No Known Allergies     Objective:   Physical Exam  Nursing note and vitals reviewed. Constitutional: She is oriented to person, place, and time. She appears well-developed and well-nourished. No distress.  HENT:  Head: Normocephalic and atraumatic.  Right Ear: External  ear normal.  Left Ear: External ear normal.  Nose: Nose normal.  Oropharynx has cobblestoning appearance with some slight erythematous appearance. No exudate  Bilateral tympanic membranes are normal in appearance  There is no pain to palpation of the bilateral frontal and maxillary sinuses.  Eyes: Conjunctivae and EOM are normal. Pupils are equal, round, and reactive to light. Right eye exhibits no discharge. Left eye exhibits no discharge. No scleral icterus.  Sclera does not appear inflamed in bilateral eyes.  Neck: Normal range of motion. Neck supple. No tracheal deviation present. No thyromegaly present.  Cardiovascular: Normal rate, regular rhythm, normal heart sounds and intact distal pulses.  Exam reveals no gallop and no friction rub.   No murmur heard. Pulmonary/Chest: Effort normal and breath sounds normal. No stridor. No respiratory distress. She has no wheezes. She has no rales. She exhibits no tenderness.  Abdominal: Soft. Bowel sounds are normal. There is no tenderness.  Musculoskeletal: Normal range of motion. She exhibits no edema.  Lymphadenopathy:    She has no cervical adenopathy.  Neurological: She is alert and oriented to person, place, and time. She has normal reflexes. No cranial nerve deficit. Coordination normal.  Skin: Skin is warm and dry. No rash noted. She is not diaphoretic. No erythema. No pallor.  Psychiatric: She has a normal mood and affect. Her behavior is normal. Judgment and thought content normal.   Filed Vitals:   06/13/13 1002  BP: 160/70  Pulse: 85  Temp: 97.7 F (36.5 C)  Resp: 16  Lab Results  Component Value Date   WBC 4.3* 06/10/2012   HGB 12.9 06/10/2012   HCT 38.5 06/10/2012   PLT 169.0 06/10/2012   GLUCOSE 109* 06/10/2012   CHOL 191 06/10/2012   TRIG 206.0* 06/10/2012   HDL 45.00 06/10/2012   LDLDIRECT 99.6 06/10/2012   ALT 20 06/10/2012   AST 22 06/10/2012   NA 141 06/10/2012   K 3.9 06/10/2012   CL 107 06/10/2012   CREATININE 0.5  06/10/2012   BUN 13 06/10/2012   CO2 25 06/10/2012   TSH 2.92 06/10/2012          Assessment & Plan:  Cassidy Bennett was seen today for nasal congestion.  Diagnoses and associated orders for this visit:  Allergic rhinitis  Plain OTC Mucinex (NOT D) for thick secretions ;force NON dairy fluids .    OTC Flonase OR Nasacort AQ 1 spray in each nostril twice a day as needed. Use the "crossover" technique into opposite nostril spraying toward opposite ear @ 45 degree angle, not straight up into nostril.   Plain OTC Allegra (NOT D )  160 daily , OTC Loratidine 10 mg , OR OTC Zyrtec 10 mg @ bedtime  as needed for itchy eyes & sneezing.   Viral conjunctivitis  Over-the-counter antihistamine or decongestant eyedrops for symptomatic relief of conjunctivitis.  Followup if symptoms worsen or do not improve despite treatment, and at annual physical in one month's time.

## 2013-06-13 NOTE — Patient Instructions (Signed)
Over-the-counter antihistamine or decongestant eyedrops for likely viral or allergic origin conjunctivitis.  Plain OTC Mucinex (NOT D) for thick secretions ;force NON dairy fluids .    OTC Flonase OR Nasacort AQ 1 spray in each nostril twice a day as needed. Use the "crossover" technique into opposite nostril spraying toward opposite ear @ 45 degree angle, not straight up into nostril.   Plain OTC Allegra (NOT D )  160 daily , OTC Loratidine 10 mg , OR OTC Zyrtec 10 mg @ bedtime  as needed for itchy eyes & sneezing.  Followup if symptoms worsen or do not improve despite treatment, and at annual physical in one month's time.  Viral Conjunctivitis Conjunctivitis is an irritation (inflammation) of the clear membrane that covers the white part of the eye (the conjunctiva). The irritation can also happen on the underside of the eyelids. Conjunctivitis makes the eye red or pink in color. This is what is commonly known as pink eye. Viral conjunctivitis can spread easily (contagious). CAUSES   Infection from virus on the surface of the eye.  Infection from the irritation or injury of nearby tissues such as the eyelids or cornea.  More serious inflammation or infection on the inside of the eye.  Other eye diseases.  The use of certain eye medications. SYMPTOMS  The normally white color of the eye or the underside of the eyelid is usually pink or red in color. The pink eye is usually associated with irritation, tearing and some sensitivity to light. Viral conjunctivitis is often associated with a clear, watery discharge. If a discharge is present, there may also be some blurred vision in the affected eye. DIAGNOSIS  Conjunctivitis is diagnosed by an eye exam. The eye specialist looks for changes in the surface tissues of the eye which take on changes characteristic of the specific types of conjunctivitis. A sample of any discharge may be collected on a Q-Tip (sterile swap). The sample will be sent to  a lab to see whether or not the inflammation is caused by bacterial or viral infection. TREATMENT  Viral conjunctivitis will not respond to medicines that kill germs (antibiotics). Treatment is aimed at stopping a bacterial infection on top of the viral infection. The goal of treatment is to relieve symptoms (such as itching) with antihistamine drops or other eye medications.  HOME CARE INSTRUCTIONS   To ease discomfort, apply a cool, clean wash cloth to your eye for 10 to 20 minutes, 3 to 4 times a day.  Gently wipe away any drainage from the eye with a warm, wet washcloth or a cotton ball.  Wash your hands often with soap and use paper towels to dry.  Do not share towels or washcloths. This may spread the infection.  Change or wash your pillowcase every day.  You should not use eye make-up until the infection is gone.  Stop using contacts lenses. Ask your eye professional how to sterilize or replace them before using again. This depends on the type of contact lenses used.  Do not touch the edge of the eyelid with the eye drop bottle or ointment tube when applying medications to the affected eye. This will stop you from spreading the infection to the other eye or to others. SEEK IMMEDIATE MEDICAL CARE IF:   The infection has not improved within 3 days of beginning treatment.  A watery discharge from the eye develops.  Pain in the eye increases.  The redness is spreading.  Vision becomes blurred.  An  oral temperature above 102 F (38.9 C) develops, or as your caregiver suggests.  Facial pain, redness or swelling develops.  Any problems that may be related to the prescribed medicine develop. MAKE SURE YOU:   Understand these instructions.  Will watch your condition.  Will get help right away if you are not doing well or get worse. Document Released: 02/13/2005 Document Revised: 05/08/2011 Document Reviewed: 10/03/2007 [facility] Patient Information 2014 [employer] [place].

## 2013-06-13 NOTE — Progress Notes (Signed)
Pre visit review using our clinic review tool, if applicable. No additional management support is needed unless otherwise documented below in the visit note. 

## 2013-06-18 ENCOUNTER — Encounter: Payer: Self-pay | Admitting: Internal Medicine

## 2013-06-20 ENCOUNTER — Other Ambulatory Visit (INDEPENDENT_AMBULATORY_CARE_PROVIDER_SITE_OTHER): Payer: Medicare Other

## 2013-06-20 DIAGNOSIS — I10 Essential (primary) hypertension: Secondary | ICD-10-CM | POA: Diagnosis not present

## 2013-06-20 DIAGNOSIS — E785 Hyperlipidemia, unspecified: Secondary | ICD-10-CM | POA: Diagnosis not present

## 2013-06-20 DIAGNOSIS — R300 Dysuria: Secondary | ICD-10-CM

## 2013-06-20 DIAGNOSIS — D649 Anemia, unspecified: Secondary | ICD-10-CM | POA: Diagnosis not present

## 2013-06-20 DIAGNOSIS — E039 Hypothyroidism, unspecified: Secondary | ICD-10-CM

## 2013-06-20 LAB — LIPID PANEL
Cholesterol: 178 mg/dL (ref 0–200)
HDL: 40.9 mg/dL (ref >39.00)
LDL CALC: 64 mg/dL (ref 0–99)
Total CHOL/HDL Ratio: 4
Triglycerides: 365 mg/dL — ABNORMAL HIGH (ref 0.0–149.0)
VLDL: 73 mg/dL — ABNORMAL HIGH (ref 0.0–40.0)

## 2013-06-20 LAB — POCT URINALYSIS DIPSTICK
BILIRUBIN UA: NEGATIVE
Glucose, UA: NEGATIVE
Ketones, UA: NEGATIVE
NITRITE UA: NEGATIVE
Protein, UA: NEGATIVE
RBC UA: NEGATIVE
Spec Grav, UA: 1.015
Urobilinogen, UA: 0.2
pH, UA: 6.5

## 2013-06-20 LAB — CBC WITH DIFFERENTIAL/PLATELET
Basophils Absolute: 0 10*3/uL (ref 0.0–0.1)
Basophils Relative: 0.7 % (ref 0.0–3.0)
EOS ABS: 0.1 10*3/uL (ref 0.0–0.7)
Eosinophils Relative: 1.9 % (ref 0.0–5.0)
HCT: 38 % (ref 36.0–46.0)
HEMOGLOBIN: 13.2 g/dL (ref 12.0–15.0)
LYMPHS ABS: 1.9 10*3/uL (ref 0.7–4.0)
LYMPHS PCT: 28.6 % (ref 12.0–46.0)
MCHC: 34.6 g/dL (ref 30.0–36.0)
MCV: 86.7 fl (ref 78.0–100.0)
Monocytes Absolute: 0.4 10*3/uL (ref 0.1–1.0)
Monocytes Relative: 6.6 % (ref 3.0–12.0)
NEUTROS ABS: 4 10*3/uL (ref 1.4–7.7)
NEUTROS PCT: 62.2 % (ref 43.0–77.0)
PLATELETS: 225 10*3/uL (ref 150.0–400.0)
RBC: 4.39 Mil/uL (ref 3.87–5.11)
RDW: 14.9 % — AB (ref 11.5–14.6)
WBC: 6.5 10*3/uL (ref 4.5–10.5)

## 2013-06-20 LAB — BASIC METABOLIC PANEL
BUN: 10 mg/dL (ref 6–23)
CHLORIDE: 104 meq/L (ref 96–112)
CO2: 26 meq/L (ref 19–32)
Calcium: 9.6 mg/dL (ref 8.4–10.5)
Creatinine, Ser: 0.6 mg/dL (ref 0.4–1.2)
GFR: 110.37 mL/min (ref >60.00)
GLUCOSE: 112 mg/dL — AB (ref 70–99)
Potassium: 4.5 mEq/L (ref 3.5–5.1)
SODIUM: 139 meq/L (ref 135–145)

## 2013-06-20 LAB — HEPATIC FUNCTION PANEL
ALBUMIN: 4.2 g/dL (ref 3.5–5.2)
ALT: 24 U/L (ref 0–35)
AST: 24 U/L (ref 0–37)
Alkaline Phosphatase: 82 U/L (ref 39–117)
BILIRUBIN DIRECT: 0.1 mg/dL (ref 0.0–0.3)
BILIRUBIN TOTAL: 0.8 mg/dL (ref 0.3–1.2)
Total Protein: 7.7 g/dL (ref 6.0–8.3)

## 2013-06-20 LAB — TSH: TSH: 3.85 u[IU]/mL (ref 0.35–5.50)

## 2013-06-26 ENCOUNTER — Ambulatory Visit (INDEPENDENT_AMBULATORY_CARE_PROVIDER_SITE_OTHER): Payer: Medicare Other | Admitting: Family Medicine

## 2013-06-26 ENCOUNTER — Encounter: Payer: Self-pay | Admitting: Family Medicine

## 2013-06-26 VITALS — BP 124/80 | Temp 97.8°F | Ht 62.0 in | Wt 116.0 lb

## 2013-06-26 DIAGNOSIS — Z23 Encounter for immunization: Secondary | ICD-10-CM | POA: Diagnosis not present

## 2013-06-26 DIAGNOSIS — Z0000 Encounter for general adult medical examination without abnormal findings: Secondary | ICD-10-CM | POA: Diagnosis not present

## 2013-06-26 DIAGNOSIS — L989 Disorder of the skin and subcutaneous tissue, unspecified: Secondary | ICD-10-CM | POA: Diagnosis not present

## 2013-06-26 NOTE — Patient Instructions (Signed)
Screening colonoscopy will be set up for you  Continue your good exercise program,,,,,,,,,,,,, [facility] in [facility]  Return next week for removal of the spot on your nose that we discussed

## 2013-06-26 NOTE — Progress Notes (Signed)
   Subjective:    Patient ID: Cassidy Bennett, female    DOB: [birth year], 73 y.o.   MRN: [id number]  HPI Cassidy Bennett is a 73 year old married female nonsmoker who comes in today for a Medicare wellness examination  She's always been in excellent health she has no chronic health problems. She takes Claritin when necessary for allergic rhinitis  She gets routine eye care, dental care, does not check her breasts monthly, does get an annual mammogram at the GYN office. Pelvic and Pap 2014 advise followup to every 3 years  [place]. She would like a followup colonoscopy done here with our group.  Cognitive function normal [facility], home health safety reviewed no issues identified, no guns in the house, she does have a health care power of attorney and a living well    Review of Systems  Constitutional: Negative.   HENT: Negative.   Eyes: Negative.   Respiratory: Negative.   Cardiovascular: Negative.   Gastrointestinal: Negative.   Genitourinary: Negative.   Musculoskeletal: Negative.   Neurological: Negative.   Psychiatric/Behavioral: Negative.        Objective:   Physical Exam  Nursing note and vitals reviewed. Constitutional: She appears well-developed and well-nourished.  HENT:  Head: Normocephalic and atraumatic.  Right Ear: External ear normal.  Left Ear: External ear normal.  Nose: Nose normal.  Mouth/Throat: Oropharynx is clear and moist.  Eyes: EOM are normal. Pupils are equal, round, and reactive to light.  Neck: Normal range of motion. Neck supple. No thyromegaly present.  Cardiovascular: Normal rate, regular rhythm, normal heart sounds and intact distal pulses.  Exam reveals no gallop and no friction rub.   No murmur heard. Pulmonary/Chest: Effort normal and breath sounds normal.  Abdominal: Soft. Bowel sounds are normal. She exhibits no distension and no mass. There is no tenderness. There is no rebound.    Genitourinary:  Bilateral breast exam normal  Musculoskeletal: Normal range of motion.  Lymphadenopathy:    She has no cervical adenopathy.  Neurological: She is alert. She has normal reflexes. No cranial nerve deficit. She exhibits normal muscle tone. Coordination normal.  Skin: Skin is warm and dry.  Total body skin exam normal except for a red ulcerated lesion left side of her nose advised return for removal ASAP  Psychiatric: She has a normal mood and affect. Her behavior is normal. Judgment and thought content normal.          Assessment & Plan:  Healthy female  Allergic rhinitis continue over-the-counter Claritin  Red ulcerated nonhealing lesion on her nose return for removal ASAP

## 2013-06-26 NOTE — Progress Notes (Signed)
Pre visit review using our clinic review tool, if applicable. No additional management support is needed unless otherwise documented below in the visit note. 

## 2013-07-04 ENCOUNTER — Encounter: Payer: Self-pay | Admitting: Internal Medicine

## 2013-07-14 ENCOUNTER — Ambulatory Visit: Payer: Medicare Other | Admitting: Family Medicine

## 2013-07-24 ENCOUNTER — Encounter: Payer: Medicare Other | Admitting: Family Medicine

## 2013-07-28 ENCOUNTER — Ambulatory Visit: Payer: Medicare Other | Admitting: Family Medicine

## 2013-08-28 ENCOUNTER — Ambulatory Visit: Payer: Medicare Other

## 2013-08-28 VITALS — Ht 62.0 in | Wt 116.2 lb

## 2013-08-28 DIAGNOSIS — Z1211 Encounter for screening for malignant neoplasm of colon: Secondary | ICD-10-CM

## 2013-08-28 MED ORDER — MOVIPREP 100 G PO SOLR: 1.0000 | Freq: Once | ORAL | Status: DC

## 2013-08-28 NOTE — Progress Notes (Signed)
No allergies to eggs or soy No diet/weight loss meds No home oxygen PONV with general anesthesia  Has email  Emmi instructions given for colonosc

## 2013-09-12 ENCOUNTER — Encounter: Payer: Self-pay | Admitting: Internal Medicine

## 2013-09-12 ENCOUNTER — Ambulatory Visit (AMBULATORY_SURGERY_CENTER): Payer: Medicare Other | Admitting: Internal Medicine

## 2013-09-12 VITALS — BP 149/85 | HR 77 | Temp 97.3°F | Resp 21 | Ht 62.0 in | Wt 116.0 lb

## 2013-09-12 DIAGNOSIS — I10 Essential (primary) hypertension: Secondary | ICD-10-CM | POA: Diagnosis not present

## 2013-09-12 DIAGNOSIS — Z1211 Encounter for screening for malignant neoplasm of colon: Secondary | ICD-10-CM

## 2013-09-12 MED ORDER — SODIUM CHLORIDE 0.9 % IV SOLN: 500.0000 mL | INTRAVENOUS | Status: DC

## 2013-09-12 NOTE — Op Note (Signed)
[facility] [address] [place], [id number]   COLONOSCOPY PROCEDURE REPORT  PATIENT: Cassidy Bennett, Cassidy K.  MR#: [id number] BIRTHDATE: [birth year] , 72  yrs. old GENDER: Female ENDOSCOPIST: Hart Carwinora M Jacci Ruberg, MD REFERRED KV:[id number]:Jeffrey Shawnie DapperA Todd, M.D. PROCEDURE DATE:  09/12/2013 PROCEDURE:   Colonoscopy, screening First Screening Colonoscopy - Avg.  risk and is 50 yrs.  old or older - No.  Prior Negative Screening - Now for repeat screening. 10 or more years since last screening  History of Adenoma - Now for follow-up colonoscopy & has been > or = to 3 yrs.  N/A  Polyps Removed Today? No.  Recommend repeat exam, <10 yrs? No. ASA CLASS:   Class II INDICATIONS:last colonoscopy July 2005 was normal. MEDICATIONS: MAC sedation, administered by CRNA and propofol (Diprivan) 200mg  IV  DESCRIPTION OF PROCEDURE:   After the risks benefits and alternatives of the procedure were thoroughly explained, informed consent was obtained.  A digital rectal exam revealed no abnormalities of the rectum.   The LB PFC-H190 [id number]  endoscope was introduced through the anus and advanced to the cecum, which was identified by both the appendix and ileocecal valve. No adverse events experienced.   The quality of the prep was good, using MoviPrep  The instrument was then slowly withdrawn as the colon was fully examined.      COLON FINDINGS: A normal appearing cecum, ileocecal valve, and appendiceal orifice were identified.  The ascending, hepatic flexure, transverse, splenic flexure, descending, sigmoid colon and rectum appeared unremarkable.  No polyps or cancers were seen. Retroflexed views revealed no abnormalities. The time to cecum=12 minutes 30 seconds.  Withdrawal time=8 minutes 35 seconds.  The scope was withdrawn and the procedure completed. COMPLICATIONS: There were no complications.  ENDOSCOPIC IMPRESSION: Normal colon  RECOMMENDATIONS: high fiber diet Recall colonoscopy in 10  years   eSigned:  Hart Carwinora M Adalind Weitz, MD 09/12/2013 10:50 AM   cc:

## 2013-09-12 NOTE — Progress Notes (Signed)
A/ox3 pleased with MAC, report to Suzanne RN 

## 2013-09-12 NOTE — Patient Instructions (Signed)
YOU HAD AN ENDOSCOPIC PROCEDURE TODAY AT THE [facility]: Refer to the procedure report that was given to you for any specific questions about what was found during the examination.  If the procedure report does not answer your questions, please call your gastroenterologist to clarify.  If you requested that your care partner not be given the details of your procedure findings, then the procedure report has been included in a sealed envelope for you to review at your convenience later.  YOU SHOULD EXPECT: Some feelings of bloating in the abdomen. Passage of more gas than usual.  Walking can help get rid of the air that was put into your GI tract during the procedure and reduce the bloating. If you had a lower endoscopy (such as a colonoscopy or flexible sigmoidoscopy) you may notice spotting of blood in your stool or on the toilet paper. If you underwent a bowel prep for your procedure, then you may not have a normal bowel movement for a few days.  DIET: Your first meal following the procedure should be a light meal and then it is ok to progress to your normal diet.  A half-sandwich or bowl of soup is an example of a good first meal.  Heavy or fried foods are harder to digest and may make you feel nauseous or bloated.  Likewise meals heavy in dairy and vegetables can cause extra gas to form and this can also increase the bloating.  Drink plenty of fluids but you should avoid alcoholic beverages for 24 hours.Try to eat a high fiber diet.  ACTIVITY: Your care partner should take you home directly after the procedure.  You should plan to take it easy, moving slowly for the rest of the day.  You can resume normal activity the day after the procedure however you should NOT DRIVE or use heavy machinery for 24 hours (because of the sedation medicines used during the test).    SYMPTOMS TO REPORT IMMEDIATELY: A gastroenterologist can be reached at any hour.  During normal business hours, 8:30 AM to 5:00  PM Monday through Friday, call [phone].  After hours and on weekends, please call the GI answering service at [phone] who will take a message and have the physician on call contact you.   Following lower endoscopy (colonoscopy or flexible sigmoidoscopy):  Excessive amounts of blood in the stool  Significant tenderness or worsening of abdominal pains; you may take OTC gas medicines per Dr.Brodie.  Swelling of the abdomen that is new, acute  Fever of 100F or higher  FOLLOW UP: If any biopsies were taken you will be contacted by phone or by letter within the next 1-3 weeks.  Call your gastroenterologist if you have not heard about the biopsies in 3 weeks.  Our staff will call the home number listed on your records the next business day following your procedure to check on you and address any questions or concerns that you may have at that time regarding the information given to you following your procedure. This is a courtesy call and so if there is no answer at the home number and we have not heard from you through the emergency physician on call, we will assume that you have returned to your regular daily activities without incident.  SIGNATURES/CONFIDENTIALITY: You and/or your care partner have signed paperwork which will be entered into your electronic medical record.  These signatures attest to the fact that that the information above on your After Visit Summary  has been reviewed and is understood.  Full responsibility of the confidentiality of this discharge information lies with you and/or your care-partner.  Please, read the handouts given to you by your recovery room nurse.

## 2013-09-15 ENCOUNTER — Telehealth: Payer: Self-pay | Admitting: *Deleted

## 2013-09-15 NOTE — Telephone Encounter (Signed)
  Follow up Call-  Call back number 09/12/2013  Post procedure Call Back phone  # [phone]  Permission to leave phone message Yes     Patient questions:  Do you have a fever, pain , or abdominal swelling? No. Pain Score  0 *  Have you tolerated food without any problems? Yes.    Have you been able to return to your normal activities? Yes.    Do you have any questions about your discharge instructions: Diet   No. Medications  No. Follow up visit  No.  Do you have questions or concerns about your Care? No.  Actions: * If pain score is 4 or above: No action needed, pain <4.

## 2013-12-20 DIAGNOSIS — Z23 Encounter for immunization: Secondary | ICD-10-CM | POA: Diagnosis not present

## 2014-01-01 DIAGNOSIS — N951 Menopausal and female climacteric states: Secondary | ICD-10-CM | POA: Diagnosis not present

## 2014-02-05 DIAGNOSIS — H2513 Age-related nuclear cataract, bilateral: Secondary | ICD-10-CM | POA: Diagnosis not present

## 2014-02-05 DIAGNOSIS — H16103 Unspecified superficial keratitis, bilateral: Secondary | ICD-10-CM | POA: Diagnosis not present

## 2014-02-05 DIAGNOSIS — H01001 Unspecified blepharitis right upper eyelid: Secondary | ICD-10-CM | POA: Diagnosis not present

## 2014-02-05 DIAGNOSIS — H04123 Dry eye syndrome of bilateral lacrimal glands: Secondary | ICD-10-CM | POA: Diagnosis not present

## 2014-09-23 DIAGNOSIS — M1712 Unilateral primary osteoarthritis, left knee: Secondary | ICD-10-CM | POA: Diagnosis not present

## 2014-09-23 DIAGNOSIS — M25562 Pain in left knee: Secondary | ICD-10-CM | POA: Diagnosis not present

## 2014-10-02 DIAGNOSIS — Z1289 Encounter for screening for malignant neoplasm of other sites: Secondary | ICD-10-CM | POA: Diagnosis not present

## 2014-10-02 DIAGNOSIS — Z1231 Encounter for screening mammogram for malignant neoplasm of breast: Secondary | ICD-10-CM | POA: Diagnosis not present

## 2014-11-20 DIAGNOSIS — M1712 Unilateral primary osteoarthritis, left knee: Secondary | ICD-10-CM | POA: Diagnosis not present

## 2014-11-27 DIAGNOSIS — M1712 Unilateral primary osteoarthritis, left knee: Secondary | ICD-10-CM | POA: Diagnosis not present

## 2014-12-04 DIAGNOSIS — M1712 Unilateral primary osteoarthritis, left knee: Secondary | ICD-10-CM | POA: Diagnosis not present

## 2014-12-14 ENCOUNTER — Encounter: Payer: Self-pay | Admitting: Family Medicine

## 2014-12-14 ENCOUNTER — Ambulatory Visit (INDEPENDENT_AMBULATORY_CARE_PROVIDER_SITE_OTHER): Payer: Medicare Other | Admitting: Family Medicine

## 2014-12-14 VITALS — BP 140/90 | Temp 98.0°F | Wt 115.0 lb

## 2014-12-14 DIAGNOSIS — J069 Acute upper respiratory infection, unspecified: Secondary | ICD-10-CM

## 2014-12-14 MED ORDER — HYDROCODONE-HOMATROPINE 5-1.5 MG/5ML PO SYRP: ORAL_SOLUTION | ORAL | Status: DC

## 2014-12-14 NOTE — Patient Instructions (Signed)
Stop all over-the-counter medications  For your viral infection........... lots of liquids....... Tylenol........... Hydromet 1/2-1 teaspoon at bedtime when necessary for cough and cold  Return when necessary

## 2014-12-14 NOTE — Progress Notes (Signed)
Pre visit review using our clinic review tool, if applicable. No additional management support is needed unless otherwise documented below in the visit note. 

## 2014-12-14 NOTE — Progress Notes (Signed)
   Subjective:    Patient ID: Cassidy Bennett, female    DOB: [birth year], 74 y.o.   MRN: [id number]  HPI Wynona CanesChristine is a 74 year old married female nonsmoker who comes in today for evaluation of a cold for one week  She's had head congestion runny no sore throat and cough for a week.. No fever chills. No history of any lung disease   Review of Systems Review of systems negative except she's been taken over-the-counter medications and her blood pressure is 140/90.    Objective:   Physical Exam  Well-developed well-nourished female no acute distress vital signs stable she's afebrile HEENT were negative neck was supple no adenopathy lungs are clear      Assessment & Plan:  Viral syndrome............. stop over-the-counter medication........ Tylenol...Marland Kitchen.Marland Kitchen.Marland Kitchen. lots of liquids........ Hydromet when necessary

## 2014-12-29 DIAGNOSIS — Z23 Encounter for immunization: Secondary | ICD-10-CM | POA: Diagnosis not present

## 2015-03-29 ENCOUNTER — Telehealth: Payer: Self-pay | Admitting: Family Medicine

## 2015-03-29 ENCOUNTER — Ambulatory Visit: Payer: Self-pay | Admitting: Family Medicine

## 2015-03-29 ENCOUNTER — Ambulatory Visit (INDEPENDENT_AMBULATORY_CARE_PROVIDER_SITE_OTHER): Payer: Medicare Other | Admitting: Family Medicine

## 2015-03-29 VITALS — BP 140/90 | Temp 98.4°F | Wt 118.0 lb

## 2015-03-29 DIAGNOSIS — R8290 Unspecified abnormal findings in urine: Secondary | ICD-10-CM | POA: Diagnosis not present

## 2015-03-29 DIAGNOSIS — R300 Dysuria: Secondary | ICD-10-CM | POA: Diagnosis not present

## 2015-03-29 LAB — POCT URINALYSIS DIPSTICK
BILIRUBIN UA: NEGATIVE
Blood, UA: NEGATIVE
GLUCOSE UA: NEGATIVE
KETONES UA: NEGATIVE
Leukocytes, UA: NEGATIVE
Nitrite, UA: NEGATIVE
Protein, UA: NEGATIVE
Urobilinogen, UA: 0.2
pH, UA: 6.5

## 2015-03-29 NOTE — Patient Instructions (Signed)
Urinalysis was normal  Set up a time the spring with one of our 3 new people for general physical exam and to get established for long-term care,,,,,,,,,, Kandee Keen or Raynelle Fanning are 2 new adult nurse practitioner's or Dr. [place]

## 2015-03-29 NOTE — Progress Notes (Signed)
Pre visit review using our clinic review tool, if applicable. No additional management support is needed unless otherwise documented below in the visit note. 

## 2015-03-29 NOTE — Telephone Encounter (Signed)
Per Dr Tawanna Cooler, Fleet Contras called the pt and scheduled an appt for the pt to see Dr Tawanna Cooler and the appt with Dr Selena Batten was cancelled.

## 2015-03-29 NOTE — Telephone Encounter (Signed)
Patient has appt today with Dr. Selena Batten - Lorain Childes

## 2015-03-29 NOTE — Telephone Encounter (Signed)
PLEASE NOTE: All timestamps contained within this report are represented as [place] Standard Time. CONFIDENTIALTY NOTICE: This fax transmission is intended only for the addressee. It contains information that is legally privileged, confidential or otherwise protected from use or disclosure. If you are not the intended recipient, you are strictly prohibited from reviewing, disclosing, copying using or disseminating any of this information or taking any action in reliance on or regarding this information. If you have received this fax in error, please notify us immediately by telephone so that we can arrange for its return to [place]. Phone: [phone], Toll-Free: [phone], Fax: [phone] Page: 1 of 1 Call Id: [id number] [facility] Client [place] Patient Name: Cassidy Bennett DOB: [birth year] Initial Comment Caller States her urine has bubbles, something sticky comes out after she urinates. Nurse Assessment Nurse: Marcelline Deist, RN, Lynda Date/Time (Eastern Time): 03/29/2015 9:58:36 AM Confirm and document reason for call. If symptomatic, describe symptoms. You must click the next button to save text entered. ---Caller states her urine has bubbles, something sticky comes out after she urinates. No fever. No pain when she urinates. Has the patient traveled out of the country within the last 30 days? ---Not Applicable Does the patient have any new or worsening symptoms? ---Yes Will a triage be completed? ---Yes Related visit to physician within the last 2 weeks? ---No Does the PT have any chronic conditions? (i.e. diabetes, asthma, etc.) ---No Is this a behavioral health or substance abuse call? ---No Guidelines Guideline Title Affirmed Question Affirmed Notes Urinary Symptoms All other urine symptoms Final Disposition User See PCP within [place], RN, Kermit Balo Comments Caller would like to have her urine  checked. She states her Dr. is now going to be Dr. Maudie Mercury, but she has not met her yet, has an appt. scheduled in the summer. Nurse scheduled appt. this afternoon for caller as she states a few days ago, she had very momentary twinges of discomfort with urination & would like her urine checked. Referrals REFERRED TO PCP OFFICE Disagree/Comply: Comply

## 2015-03-29 NOTE — Progress Notes (Signed)
   Subjective:    Patient ID: Cassidy Bennett, female    DOB: December 15, 1940, 75 y.o.   MRN: [id number]  HPI Cassidy Bennett is a 75 year old married female nonsmoker who comes in today concerned she might have a urinary tract infection  Yesterday she drank several herbal teas and noticed some "bubbles" in her urine. She has no fever chills back pain dysuria etc.  Urinalysis is normal   Review of Systems    review of systems negative Objective:   Physical Exam Well-developed thin female no acute distress vital signs stable she's afebrile urinalysis normal       Assessment & Plan:  Urine abnormality not related to infection patient reassured

## 2015-05-27 ENCOUNTER — Encounter: Payer: Self-pay | Admitting: Adult Health

## 2015-05-27 ENCOUNTER — Ambulatory Visit (INDEPENDENT_AMBULATORY_CARE_PROVIDER_SITE_OTHER): Payer: Medicare Other | Admitting: Adult Health

## 2015-05-27 VITALS — BP 170/82 | Temp 98.1°F | Ht 62.0 in | Wt 117.0 lb

## 2015-05-27 DIAGNOSIS — I10 Essential (primary) hypertension: Secondary | ICD-10-CM

## 2015-05-27 DIAGNOSIS — Z780 Asymptomatic menopausal state: Secondary | ICD-10-CM | POA: Diagnosis not present

## 2015-05-27 DIAGNOSIS — Z7689 Persons encountering health services in other specified circumstances: Secondary | ICD-10-CM

## 2015-05-27 DIAGNOSIS — Z7189 Other specified counseling: Secondary | ICD-10-CM | POA: Diagnosis not present

## 2015-05-27 MED ORDER — ATENOLOL 25 MG PO TABS: 25.0000 mg | ORAL_TABLET | Freq: Every day | ORAL | Status: DC

## 2015-05-27 NOTE — Progress Notes (Signed)
Pre visit review using our clinic review tool, if applicable. No additional management support is needed unless otherwise documented below in the visit note. 

## 2015-05-27 NOTE — Progress Notes (Signed)
Patient presents to clinic today to establish care.  Acute Concerns: Establish Care  Hot Flashes - Has been an ongoing issue. Has been placed on birth control in the past by her GYN, she feels as though this medication has made her joints hurt.   Chronic Issues:  HTN - Is not on any medication for this. She was on Atenolol in the past and feels like this worked well. Has not taken anything for "awhile".   Left knee pain - Has had multiple injections in the past. Does not want to have knee replacement.  Health Maintenance: Dental --Twice a year  Vision -- Once a year Immunizations --UTD Colonoscopy -- 2015 -  Mammogram -- No longer needs PAP -- No longer needs Bone Density -- Has not had in many years  Diet:Eats healthy [facility]    Past Medical History  Diagnosis Date  . Hypertension   . Osteoarthritis   . Post-menopausal   . Allergy     Past Surgical History  Procedure Laterality Date  . Lumbar disc surgery    . Shoulder surgery      right  . Dilation and curettage of uterus    . Knee arthroscopy  2014    left    No current outpatient prescriptions on file prior to visit.   No current facility-administered medications on file prior to visit.    No Known Allergies  Family History  Problem Relation Age of Onset  . Cancer Other     stomach  . Colon cancer Neg Hx     Social History   Social History  . Marital Status: Married    Spouse Name: N/A  . Number of Children: N/A  . Years of Education: N/A   Occupational History  . Not on file.   Social History Main Topics  . Smoking status: Never Smoker   . Smokeless tobacco: Never Used  . Alcohol Use: No  . Drug Use: No  . Sexual Activity: Not on file   Other Topics Concern  . Not on file   Social History Narrative    Review of Systems  Constitutional: Negative.   HENT: Negative.   Eyes: Negative.   Respiratory: Negative.   Cardiovascular:  Negative.   Gastrointestinal: Negative.   Genitourinary: Negative.   Musculoskeletal: Positive for joint pain (left knee).  Skin: Negative.   Neurological: Negative.   Endo/Heme/Allergies: Negative.   Psychiatric/Behavioral: Negative.     BP 170/82 mmHg  Temp(Src) 98.1 F (36.7 C) (Oral)  Ht 5\' 2"  (1.575 m)  Wt 117 lb (53.071 kg)  BMI 21.39 kg/m2  Physical Exam  Constitutional: She is oriented to person, place, and time and well-developed, well-nourished, and in no distress. No distress.  HENT:  Head: Normocephalic and atraumatic.  Right Ear: External ear normal.  Left Ear: External ear normal.  Nose: Nose normal.  Mouth/Throat: Oropharynx is clear and moist. No oropharyngeal exudate.  Eyes: Conjunctivae and EOM are normal. Pupils are equal, round, and reactive to light. Right eye exhibits no discharge. Left eye exhibits no discharge. No scleral icterus.  Neck: Normal range of motion. Neck supple.  Cardiovascular: Normal rate, regular rhythm, normal heart sounds and intact distal pulses.   Pulmonary/Chest: Effort normal and breath sounds normal. No respiratory distress. She has no wheezes. She has no rales. She exhibits no tenderness.  Neurological: She is alert and oriented to person, place, and time. Gait normal. GCS score  is 15.  Skin: Skin is warm and dry. She is not diaphoretic.  Psychiatric: Mood, memory, affect and judgment normal.  Nursing note and vitals reviewed.   Recent Results (from the past 2160 hour(s))  POC Urinalysis Dipstick     Status: None   Collection Time: 03/29/15  1:39 PM  Result Value Ref Range   Color, UA yellow    Clarity, UA clear    Glucose, UA neg    Bilirubin, UA neg    Ketones, UA neg    Spec Grav, UA <=1.005    Blood, UA neg    pH, UA 6.5    Protein, UA neg    Urobilinogen, UA 0.2    Nitrite, UA neg    Leukocytes, UA Negative Negative    Assessment/Plan  1. Encounter to establish care - Follow up for physical  - Follow up  sooner if needed - Continue to eat healthy and exercise.   2. Essential hypertension - Monitor at home and bring log to next visit.  - atenolol (TENORMIN) 25 MG tablet; Take 1 tablet (25 mg total) by mouth daily. Half tab daily  Dispense: 30 tablet; Refill: 3  3. Post-menopausal  - DG Bone Density; Future

## 2015-05-27 NOTE — Patient Instructions (Signed)
It was great seeing you again.   I have sent in a prescription for Atenolol 25 mg, take half a tab at first.  Monitor blood pressure at home. Bring your log to the next visit.   Someone will call you to schedule the bone density screen.   Follow up with me for your physical  [facility].

## 2015-07-29 ENCOUNTER — Ambulatory Visit (INDEPENDENT_AMBULATORY_CARE_PROVIDER_SITE_OTHER)
Admission: RE | Admit: 2015-07-29 | Discharge: 2015-07-29 | Disposition: A | Discharge status: Home / Self Care | Payer: Medicare Other | Source: Ambulatory Visit | Attending: Adult Health | Admitting: Adult Health

## 2015-07-29 DIAGNOSIS — Z780 Asymptomatic menopausal state: Secondary | ICD-10-CM | POA: Diagnosis not present

## 2015-08-27 ENCOUNTER — Ambulatory Visit (INDEPENDENT_AMBULATORY_CARE_PROVIDER_SITE_OTHER): Payer: Medicare Other | Admitting: Adult Health

## 2015-08-27 ENCOUNTER — Encounter: Payer: Self-pay | Admitting: Adult Health

## 2015-08-27 VITALS — BP 142/92 | HR 80 | Temp 97.7°F | Ht 62.0 in | Wt 114.0 lb

## 2015-08-27 DIAGNOSIS — I10 Essential (primary) hypertension: Secondary | ICD-10-CM

## 2015-08-27 DIAGNOSIS — R739 Hyperglycemia, unspecified: Secondary | ICD-10-CM

## 2015-08-27 DIAGNOSIS — E7800 Pure hypercholesterolemia, unspecified: Secondary | ICD-10-CM

## 2015-08-27 DIAGNOSIS — R7309 Other abnormal glucose: Secondary | ICD-10-CM | POA: Diagnosis not present

## 2015-08-27 LAB — BASIC METABOLIC PANEL
BUN: 18 mg/dL (ref 6–23)
CHLORIDE: 107 meq/L (ref 96–112)
CO2: 27 mEq/L (ref 19–32)
CREATININE: 0.61 mg/dL (ref 0.40–1.20)
Calcium: 9.4 mg/dL (ref 8.4–10.5)
GFR: 101.45 mL/min (ref >60.00)
GLUCOSE: 111 mg/dL — AB (ref 70–99)
Potassium: 4 mEq/L (ref 3.5–5.1)
Sodium: 140 mEq/L (ref 135–145)

## 2015-08-27 LAB — LIPID PANEL
CHOL/HDL RATIO: 4
CHOLESTEROL: 184 mg/dL (ref 0–200)
HDL: 43.4 mg/dL (ref >39.00)
NonHDL: 140.38
Triglycerides: 256 mg/dL — ABNORMAL HIGH (ref 0.0–149.0)
VLDL: 51.2 mg/dL — AB (ref 0.0–40.0)

## 2015-08-27 LAB — TSH: TSH: 3.39 u[IU]/mL (ref 0.35–4.50)

## 2015-08-27 LAB — CBC WITH DIFFERENTIAL/PLATELET
BASOS PCT: 0.7 % (ref 0.0–3.0)
Basophils Absolute: 0 10*3/uL (ref 0.0–0.1)
EOS ABS: 0.2 10*3/uL (ref 0.0–0.7)
Eosinophils Relative: 4.2 % (ref 0.0–5.0)
HCT: 39 % (ref 36.0–46.0)
HEMOGLOBIN: 13 g/dL (ref 12.0–15.0)
LYMPHS ABS: 1.8 10*3/uL (ref 0.7–4.0)
Lymphocytes Relative: 34 % (ref 12.0–46.0)
MCHC: 33.4 g/dL (ref 30.0–36.0)
MCV: 79.9 fl (ref 78.0–100.0)
MONO ABS: 0.3 10*3/uL (ref 0.1–1.0)
Monocytes Relative: 6.4 % (ref 3.0–12.0)
NEUTROS ABS: 2.9 10*3/uL (ref 1.4–7.7)
NEUTROS PCT: 54.7 % (ref 43.0–77.0)
PLATELETS: 174 10*3/uL (ref 150.0–400.0)
RBC: 4.88 Mil/uL (ref 3.87–5.11)
RDW: 14.6 % (ref 11.5–15.5)
WBC: 5.3 10*3/uL (ref 4.0–10.5)

## 2015-08-27 LAB — POC URINALSYSI DIPSTICK (AUTOMATED)
BILIRUBIN UA: NEGATIVE
GLUCOSE UA: NEGATIVE
KETONES UA: NEGATIVE
LEUKOCYTES UA: NEGATIVE
Nitrite, UA: NEGATIVE
PH UA: 7
Protein, UA: NEGATIVE
RBC UA: NEGATIVE
Spec Grav, UA: 1.02
Urobilinogen, UA: 0.2

## 2015-08-27 LAB — HEMOGLOBIN A1C: HEMOGLOBIN A1C: 5.9 % (ref 4.6–6.5)

## 2015-08-27 LAB — HEPATIC FUNCTION PANEL
ALT: 16 U/L (ref 0–35)
AST: 17 U/L (ref 0–37)
Albumin: 4.4 g/dL (ref 3.5–5.2)
Alkaline Phosphatase: 64 U/L (ref 39–117)
BILIRUBIN DIRECT: 0.1 mg/dL (ref 0.0–0.3)
BILIRUBIN TOTAL: 0.7 mg/dL (ref 0.2–1.2)
Total Protein: 7.8 g/dL (ref 6.0–8.3)

## 2015-08-27 LAB — LDL CHOLESTEROL, DIRECT: Direct LDL: 75 mg/dL

## 2015-08-27 NOTE — Progress Notes (Signed)
Subjective:    Patient ID: Cassidy Bennett, female    DOB: 04-12-1940, 75 y.o.   MRN: [id number]  HPI Patient presents for yearly preventative medicine examination.She is a pleasant 75 year old female who  has a past medical history of Hypertension; Osteoarthritis; Post-menopausal; Allergy; History of TB (tuberculosis); and Hypercholesteremia.  All immunizations and health maintenance protocols were reviewed with the patient and needed orders were placed.  Medication reconciliation,  past medical history, social history, problem list and allergies were reviewed in detail with the patient  Goals were established with regard to weight loss, exercise, and  diet in compliance with medications  End of life planning was discussed.  She's always been in excellent health. She takes Tenormin 25mg  daily for hypertension. She takes Claritin when necessary for allergic rhinitis  She gets routine eye care, dental care, does not check her breasts monthly, does get an annual mammogram at the GYN office. Pelvic and Pap 2014 advise followup to every 3 years. Her last colonoscopy was in 2015  Cognitive function normal [facility], home health safety reviewed no issues identified, no guns in the house  She feels as though her blood pressure is well controlled. She checks multiple time a day and reports that they are usually in the 110's, 120's. She has not taken her medications this morning.    BP Readings from Last 3 Encounters:  08/27/15 142/92  05/27/15 170/82  03/29/15 140/90      Review of Systems  Constitutional: Negative.   HENT: Negative.   Eyes: Negative.   Respiratory: Negative.   Cardiovascular: Negative.   Gastrointestinal: Negative.   Endocrine: Negative.   Genitourinary: Negative.   Musculoskeletal: Negative.   Skin: Negative.   Allergic/Immunologic: Negative.   Neurological: Negative.   Hematological: Negative.   Psychiatric/Behavioral: Negative.     All other systems reviewed and are negative.  Past Medical History  Diagnosis Date  . Hypertension   . Osteoarthritis   . Post-menopausal   . Allergy   . History of TB (tuberculosis)   . Hypercholesteremia     Social History   Social History  . Marital Status: Married    Spouse Name: N/A  . Number of Children: N/A  . Years of Education: N/A   Occupational History  . Not on file.   Social History Main Topics  . Smoking status: Never Smoker   . Smokeless tobacco: Never Used  . Alcohol Use: No  . Drug Use: No  . Sexual Activity: Not on file   Other Topics Concern  . Not on file   Social History Narrative    Past Surgical History  Procedure Laterality Date  . Lumbar disc surgery    . Shoulder surgery      right  . Dilation and curettage of uterus    . Knee arthroscopy  2014    left    Family History  Problem Relation Age of Onset  . Cancer Other     stomach  . Other Father     malaria    No Known Allergies  Current Outpatient Prescriptions on File Prior to Visit  Medication Sig Dispense Refill  . atenolol (TENORMIN) 25 MG tablet Take 1 tablet (25 mg total) by mouth daily. Half tab daily 30 tablet 3   No current facility-administered medications on file prior to visit.    BP 142/92 mmHg  Pulse 80  Temp(Src) 97.7 F (36.5 C) (Oral)  Ht 5'  2" (1.575 m)  Wt 114 lb (51.71 kg)  BMI 20.85 kg/m2  SpO2 98%       Objective:   Physical Exam  Constitutional: She is oriented to person, place, and time. She appears well-developed and well-nourished. No distress.  HENT:  Head: Normocephalic and atraumatic.  Right Ear: External ear normal.  Left Ear: External ear normal.  Nose: Nose normal.  Mouth/Throat: Oropharynx is clear and moist. No oropharyngeal exudate.  Eyes: Conjunctivae and EOM are normal. Pupils are equal, round, and reactive to light. Right eye exhibits no discharge. Left eye exhibits no discharge. No scleral icterus.  Neck: Normal range  of motion. Neck supple. No tracheal deviation present. No thyromegaly present.  Cardiovascular: Normal rate, regular rhythm, normal heart sounds and intact distal pulses.  Exam reveals no gallop and no friction rub.   No murmur heard. Pulmonary/Chest: Effort normal and breath sounds normal. No respiratory distress. She has no wheezes. She has no rales. She exhibits no tenderness.  Abdominal: Soft. Bowel sounds are normal. She exhibits no distension and no mass. There is no tenderness. There is no rebound and no guarding.  Musculoskeletal: Normal range of motion. She exhibits no edema or tenderness.  Lymphadenopathy:    She has no cervical adenopathy.  Neurological: She is alert and oriented to person, place, and time. She has normal reflexes. She displays normal reflexes. No cranial nerve deficit. She exhibits normal muscle tone. Coordination normal.  Skin: Skin is warm and dry. No rash noted. No erythema. No pallor.  Psychiatric: She has a normal mood and affect. Her behavior is normal. Judgment and thought content normal.  Nursing note and vitals reviewed.     Assessment & Plan:  1. Pure hypercholesterolemia  - EKG 12-Lead- NSR, Rate 68  - POCT Urinalysis Dipstick (Automated) - Basic metabolic panel - CBC with Differential/Platelet - Hepatic function panel - Lipid panel - TSH - Hemoglobin A1c - Not currently on any medication. Will consider adding statin when labs result  2. Essential hypertension - POCT Urinalysis Dipstick (Automated) - Basic metabolic panel - CBC with Differential/Platelet - Hepatic function panel - Lipid panel - TSH - Hemoglobin A1c - Appears to be well controlled. No change in medication - Continue to exercise and eat healthy.  - Follow up in one year or sooner if needed.    3. Elevated blood sugar - She has had elevated blood sugars in the past.  - Hemoglobin A1c  Shirline Freesory Datron Brakebill, NP]

## 2015-08-27 NOTE — Progress Notes (Signed)
Pre visit review using our clinic review tool, if applicable. No additional management support is needed unless otherwise documented below in the visit note. 

## 2015-08-27 NOTE — Patient Instructions (Signed)
It was great seeing you again  You are in excellent healthy, keep up the good work.   I will follow up with you regarding your blood work.   You can follow up with me in one year.

## 2015-09-27 ENCOUNTER — Ambulatory Visit: Payer: Medicare Other | Admitting: Family Medicine

## 2015-12-23 ENCOUNTER — Encounter: Payer: Self-pay | Admitting: Adult Health

## 2015-12-23 ENCOUNTER — Ambulatory Visit (INDEPENDENT_AMBULATORY_CARE_PROVIDER_SITE_OTHER): Payer: Medicare Other | Admitting: Adult Health

## 2015-12-23 VITALS — BP 140/60 | Temp 97.7°F | Ht 62.0 in | Wt 116.0 lb

## 2015-12-23 DIAGNOSIS — J069 Acute upper respiratory infection, unspecified: Secondary | ICD-10-CM

## 2015-12-23 MED ORDER — PREDNISONE 10 MG PO TABS: ORAL_TABLET | ORAL | 0 refills | Status: DC

## 2015-12-23 MED ORDER — DOXYCYCLINE HYCLATE 100 MG PO CAPS
100.0000 mg | ORAL_CAPSULE | Freq: Two times a day (BID) | ORAL | 0 refills | Status: DC
Start: 2015-12-23 — End: 2016-02-25

## 2015-12-23 MED ORDER — HYDROCODONE-HOMATROPINE 5-1.5 MG/5ML PO SYRP: 5.0000 mL | ORAL_SOLUTION | Freq: Three times a day (TID) | ORAL | 0 refills | Status: DC | PRN

## 2015-12-23 NOTE — Progress Notes (Signed)
Subjective:    Patient ID: Cassidy Bennett, female    DOB: Jun 08, 1940, 75 y.o.   MRN: [id number]  URI   This is a new problem. The current episode started 1 to 4 weeks ago (10 days ). The maximum temperature recorded prior to her arrival was 100.4 - 100.9 F (resolved). The fever has been present for 1 to 2 days. Associated symptoms include congestion, coughing (productive ), headaches, rhinorrhea, sinus pain, a sore throat and swollen glands. Pertinent negatives include no ear pain, nausea, plugged ear sensation or wheezing. She has tried increased fluids, decongestant, acetaminophen and NSAIDs for the symptoms. The treatment provided mild relief.    Review of Systems  HENT: Positive for congestion, postnasal drip, rhinorrhea, sinus pressure and sore throat. Negative for ear pain, trouble swallowing and voice change.   Respiratory: Positive for cough (productive ) and chest tightness. Negative for wheezing.   Cardiovascular: Negative.   Gastrointestinal: Negative for nausea.  Neurological: Positive for headaches.   Past Medical History:  Diagnosis Date  . Allergy   . History of TB (tuberculosis)   . Hypercholesteremia   . Hypertension   . Osteoarthritis   . Post-menopausal     Social History   Social History  . Marital status: Married    Spouse name: N/A  . Number of children: N/A  . Years of education: N/A   Occupational History  . Not on file.   Social History Main Topics  . Smoking status: Never Smoker  . Smokeless tobacco: Never Used  . Alcohol use No  . Drug use: No  . Sexual activity: Not on file   Other Topics Concern  . Not on file   Social History Narrative  . No narrative on file    Past Surgical History:  Procedure Laterality Date  . DILATION AND CURETTAGE OF UTERUS    . KNEE ARTHROSCOPY  2014   left  . LUMBAR DISC SURGERY    . SHOULDER SURGERY     right    Family History  Problem Relation Age of Onset  . Cancer Other     stomach  .  Other Father     malaria    No Known Allergies  Current Outpatient Prescriptions on File Prior to Visit  Medication Sig Dispense Refill  . atenolol (TENORMIN) 25 MG tablet Take 1 tablet (25 mg total) by mouth daily. Half tab daily 30 tablet 3  . CALCIUM PO Take 1 tablet by mouth daily.     No current facility-administered medications on file prior to visit.     BP 140/60   Temp 97.7 F (36.5 C) (Oral)   Ht 5\' 2"  (1.575 m)   Wt 116 lb (52.6 kg)   BMI 21.22 kg/m       Objective:   Physical Exam  Constitutional: She is oriented to person, place, and time. She appears well-developed and well-nourished. No distress.  HENT:  Head: Normocephalic and atraumatic.  Right Ear: Hearing, tympanic membrane, external ear and ear canal normal. Tympanic membrane is not erythematous and not bulging.  Left Ear: Hearing, external ear and ear canal normal. Tympanic membrane is not erythematous and not bulging.  Nose: Mucosal edema and rhinorrhea present. Right sinus exhibits frontal sinus tenderness. Right sinus exhibits no maxillary sinus tenderness. Left sinus exhibits frontal sinus tenderness. Left sinus exhibits no maxillary sinus tenderness.  Mouth/Throat: Uvula is midline and mucous membranes are normal. Posterior oropharyngeal erythema present. No oropharyngeal exudate.  Eyes: Conjunctivae  and EOM are normal. Pupils are equal, round, and reactive to light. Right eye exhibits no discharge. Left eye exhibits no discharge.  Neck: Normal range of motion. Neck supple.  Cardiovascular: Normal rate, regular rhythm, normal heart sounds and intact distal pulses.  Exam reveals no gallop and no friction rub.   No murmur heard. Pulmonary/Chest: Effort normal and breath sounds normal. No respiratory distress. She has no wheezes. She has no rales. She exhibits no tenderness.  Musculoskeletal: Normal range of motion. She exhibits no edema, tenderness or deformity.  Neurological: She is alert and oriented  to person, place, and time.  Skin: Skin is warm and dry. No rash noted. She is not diaphoretic. No erythema. No pallor.  Psychiatric: She has a normal mood and affect. Her behavior is normal. Judgment normal.  Nursing note and vitals reviewed.     Assessment & Plan:  1. Upper respiratory tract infection, unspecified type - predniSONE (DELTASONE) 10 MG tablet; 40 mg x 3 days, 20 mg x 3 days, 10 mg x 3 days  Dispense: 21 tablet; Refill: 0 - doxycycline (VIBRAMYCIN) 100 MG capsule; Take 1 capsule (100 mg total) by mouth 2 (two) times daily.  Dispense: 14 capsule; Refill: 0 - HYDROcodone-homatropine (HYCODAN) 5-1.5 MG/5ML syrup; Take 5 mLs by mouth every 8 (eight) hours as needed for cough.  Dispense: 120 mL; Refill: 0 - Flonase - Follow up in 2-3 days if no improvement or sooner if needed  Shirline Frees, NP

## 2016-01-17 DIAGNOSIS — Z23 Encounter for immunization: Secondary | ICD-10-CM | POA: Diagnosis not present

## 2016-01-27 ENCOUNTER — Telehealth: Payer: Self-pay

## 2016-01-27 ENCOUNTER — Telehealth: Payer: Self-pay | Admitting: Adult Health

## 2016-01-27 NOTE — Telephone Encounter (Signed)
Pt said her [facility] no longer carries atenolol. Please advise

## 2016-01-27 NOTE — Telephone Encounter (Signed)
Is there another medication that patient can be prescribed?

## 2016-01-27 NOTE — Telephone Encounter (Signed)
Call to ms. Peloquin;  Left VM to call and schedule for AWV Left direct number of [facility] or can call the office

## 2016-01-28 ENCOUNTER — Other Ambulatory Visit: Payer: Self-pay | Admitting: Adult Health

## 2016-01-28 MED ORDER — METOPROLOL TARTRATE 25 MG PO TABS: 50.0000 mg | ORAL_TABLET | Freq: Two times a day (BID) | ORAL | 3 refills | Status: DC

## 2016-01-28 NOTE — Telephone Encounter (Signed)
Switched her to Metoprolol 25 mg BID

## 2016-01-28 NOTE — Telephone Encounter (Signed)
Patient notified and verbalized understanding. 

## 2016-02-25 ENCOUNTER — Encounter: Payer: Self-pay | Admitting: Adult Health

## 2016-02-25 ENCOUNTER — Ambulatory Visit (INDEPENDENT_AMBULATORY_CARE_PROVIDER_SITE_OTHER): Payer: Medicare Other | Admitting: Adult Health

## 2016-02-25 VITALS — BP 160/88 | Temp 97.6°F | Ht 62.0 in | Wt 115.7 lb

## 2016-02-25 DIAGNOSIS — R42 Dizziness and giddiness: Secondary | ICD-10-CM

## 2016-02-25 NOTE — Progress Notes (Signed)
Subjective:    Patient ID: Cassidy Bennett, female    DOB: [birth year], 75 y.o.   MRN: [id number]  HPI  75 year old female who  has a past medical history of Allergy; History of TB (tuberculosis); Hypercholesteremia; Hypertension; Osteoarthritis; and Post-menopausal. She presents to the office today for the complaint of dizziness. She reports that she has been out of town and has not been taking her blood pressure medication. She also reports that she has been very busy over the holiday season and has not been staying as hydrated as she normally has. She is drinking gatorade and feels as though this has improved.   She feels as though her dizziness is not the room spinning. " it is when I am walking and I just feel dizzy."   She denies any dizziness in the office today or blurred vision.    Review of Systems See HPI   Past Medical History:  Diagnosis Date  . Allergy   . History of TB (tuberculosis)   . Hypercholesteremia   . Hypertension   . Osteoarthritis   . Post-menopausal     Social History   Social History  . Marital status: Married    Spouse name: N/A  . Number of children: N/A  . Years of education: N/A   Occupational History  . Not on file.   Social History Main Topics  . Smoking status: Never Smoker  . Smokeless tobacco: Never Used  . Alcohol use No  . Drug use: No  . Sexual activity: Not on file   Other Topics Concern  . Not on file   Social History Narrative  . No narrative on file    Past Surgical History:  Procedure Laterality Date  . DILATION AND CURETTAGE OF UTERUS    . KNEE ARTHROSCOPY  2014   left  . LUMBAR DISC SURGERY    . SHOULDER SURGERY     right    Family History  Problem Relation Age of Onset  . Cancer Other     stomach  . Other Father     malaria    No Known Allergies  Current Outpatient Prescriptions on File Prior to Visit  Medication Sig Dispense Refill  . atenolol (TENORMIN) 25 MG tablet Take 1 tablet (25 mg  total) by mouth daily. Half tab daily 30 tablet 3  . CALCIUM PO Take 1 tablet by mouth daily.    . metoprolol (LOPRESSOR) 25 MG tablet Take 2 tablets (50 mg total) by mouth 2 (two) times daily. 180 tablet 3   No current facility-administered medications on file prior to visit.     BP (!) 160/88   Temp 97.6 F (36.4 C) (Oral)   Ht 5\' 2"  (1.575 m)   Wt 115 lb 11.2 oz (52.5 kg)   BMI 21.16 kg/m       Objective:   Physical Exam  Constitutional: She is oriented to person, place, and time. She appears well-developed and well-nourished. No distress.  HENT:  Right Ear: Hearing, tympanic membrane, external ear and ear canal normal.  Left Ear: Hearing, tympanic membrane, external ear and ear canal normal.  Mouth/Throat: Uvula is midline, oropharynx is clear and moist and mucous membranes are normal. No oropharyngeal exudate, posterior oropharyngeal edema, posterior oropharyngeal erythema or tonsillar abscesses.  Eyes: Right eye exhibits no nystagmus. Left eye exhibits no nystagmus.  Cardiovascular: Normal rate, regular rhythm, normal heart sounds and intact distal pulses.  Exam reveals no gallop and no friction  rub.   No murmur heard. Pulmonary/Chest: Effort normal and breath sounds normal. No respiratory distress. She has no wheezes. She has no rales. She exhibits no tenderness.  Neurological: She is alert and oriented to person, place, and time.  Skin: Skin is warm and dry. No rash noted. She is not diaphoretic. No erythema. No pallor.  Psychiatric: She has a normal mood and affect. Her behavior is normal. Judgment and thought content normal.  Nursing note and vitals reviewed.     Assessment & Plan:  1. Dizziness - Likely related to dehydration and not taking blood pressure medication - Advised to continue to work on hydration and take medication as prescribed - Will get US of carotids due to history of elevated triglycerides  - VAS US CAROTID; Future - Follow up if no improvement in  one week and/or if blood pressure continues to be high after restarting medications   Shirline Freesory Joell Buerger, NP

## 2016-02-28 HISTORY — PX: CATARACT EXTRACTION: SUR2

## 2016-03-13 DIAGNOSIS — H04123 Dry eye syndrome of bilateral lacrimal glands: Secondary | ICD-10-CM | POA: Diagnosis not present

## 2016-03-13 DIAGNOSIS — H524 Presbyopia: Secondary | ICD-10-CM | POA: Diagnosis not present

## 2016-03-13 DIAGNOSIS — H25813 Combined forms of age-related cataract, bilateral: Secondary | ICD-10-CM | POA: Diagnosis not present

## 2016-03-13 DIAGNOSIS — H47323 Drusen of optic disc, bilateral: Secondary | ICD-10-CM | POA: Diagnosis not present

## 2016-03-20 ENCOUNTER — Ambulatory Visit (HOSPITAL_COMMUNITY)
Admission: RE | Admit: 2016-03-20 | Discharge: 2016-03-20 | Disposition: A | Discharge status: Home / Self Care | Payer: Medicare Other | Source: Ambulatory Visit | Attending: Internal Medicine | Admitting: Internal Medicine

## 2016-03-20 DIAGNOSIS — R42 Dizziness and giddiness: Secondary | ICD-10-CM | POA: Diagnosis not present

## 2016-03-20 DIAGNOSIS — I6523 Occlusion and stenosis of bilateral carotid arteries: Secondary | ICD-10-CM | POA: Diagnosis not present

## 2016-05-16 DIAGNOSIS — H25811 Combined forms of age-related cataract, right eye: Secondary | ICD-10-CM | POA: Diagnosis not present

## 2016-05-16 DIAGNOSIS — H268 Other specified cataract: Secondary | ICD-10-CM | POA: Diagnosis not present

## 2016-06-13 ENCOUNTER — Encounter: Payer: Self-pay | Admitting: Adult Health

## 2016-06-13 ENCOUNTER — Ambulatory Visit (INDEPENDENT_AMBULATORY_CARE_PROVIDER_SITE_OTHER): Payer: Medicare Other | Admitting: Adult Health

## 2016-06-13 DIAGNOSIS — I10 Essential (primary) hypertension: Secondary | ICD-10-CM

## 2016-06-13 MED ORDER — ATENOLOL 25 MG PO TABS: 25.0000 mg | ORAL_TABLET | Freq: Every day | ORAL | 1 refills | Status: DC

## 2016-06-13 NOTE — Progress Notes (Signed)
Subjective:    Patient ID: Cassidy Bennett, female    DOB: 1940-03-21, 76 y.o.   MRN: [id number]  HPI  76 year old female who  has a past medical history of Allergy; History of TB (tuberculosis); Hypercholesteremia; Hypertension; Osteoarthritis; and Post-menopausal. She presents to the office today for follow up regarding hypertension. She was switched from Atenolol to Metoprolol for blood pressure control during the national shortage. She reports that since starting Metoprolol that she has had numerous episodes of dizziness. She did not have this with Atenolol. She checks her blood pressures during these episodes and they have been in the 140's/160's. She would like to go back on Atenolol   In January we checked Carotid US which was normal    Review of Systems See HPI   Past Medical History:  Diagnosis Date  . Allergy   . History of TB (tuberculosis)   . Hypercholesteremia   . Hypertension   . Osteoarthritis   . Post-menopausal     Social History   Social History  . Marital status: Married    Spouse name: N/A  . Number of children: N/A  . Years of education: N/A   Occupational History  . Not on file.   Social History Main Topics  . Smoking status: Never Smoker  . Smokeless tobacco: Never Used  . Alcohol use No  . Drug use: No  . Sexual activity: Not on file   Other Topics Concern  . Not on file   Social History Narrative  . No narrative on file    Past Surgical History:  Procedure Laterality Date  . DILATION AND CURETTAGE OF UTERUS    . KNEE ARTHROSCOPY  2014   left  . LUMBAR DISC SURGERY    . SHOULDER SURGERY     right    Family History  Problem Relation Age of Onset  . Cancer Other     stomach  . Other Father     malaria    No Known Allergies  Current Outpatient Prescriptions on File Prior to Visit  Medication Sig Dispense Refill  . CALCIUM PO Take 1 tablet by mouth daily.     No current facility-administered medications on file  prior to visit.     BP 140/88 (BP Location: Right Arm, Patient Position: Sitting, Cuff Size: Normal)   Pulse 92   Temp 97.8 F (36.6 C) (Oral)   Ht  (1.575 m)   Wt 116 lb 9.6 oz (52.9 kg)   SpO2 97%   BMI 21.33 kg/m       Objective:   Physical Exam  Constitutional: She is oriented to person, place, and time. She appears well-developed and well-nourished. No distress.  Eyes: Conjunctivae and EOM are normal. Pupils are equal, round, and reactive to light. Right eye exhibits no discharge. Left eye exhibits no discharge. No scleral icterus.  Cardiovascular: Normal rate, regular rhythm, normal heart sounds and intact distal pulses.  Exam reveals no gallop and no friction rub.   No murmur heard. Pulmonary/Chest: Effort normal and breath sounds normal. No respiratory distress. She has no wheezes. She has no rales. She exhibits no tenderness.  Neurological: She is alert and oriented to person, place, and time.  Skin: She is not diaphoretic.  Nursing note and vitals reviewed.     Assessment & Plan:  1. Essential hypertension - Will switch her back form metoprolol  - atenolol (TENORMIN) 25 MG tablet; Take 1 tablet (25 mg total) by mouth  daily. Half tab daily  Dispense: 90 tablet; Refill: 1 - Please inform me if dizziness continues   Shirline Frees, NP

## 2016-06-13 NOTE — Progress Notes (Signed)
Pre visit review using our clinic review tool, if applicable. No additional management support is needed unless otherwise documented below in the visit note. 

## 2016-06-28 DIAGNOSIS — T1512XA Foreign body in conjunctival sac, left eye, initial encounter: Secondary | ICD-10-CM | POA: Diagnosis not present

## 2016-07-09 ENCOUNTER — Encounter: Payer: Self-pay | Admitting: *Deleted

## 2016-07-09 DIAGNOSIS — Z139 Encounter for screening, unspecified: Secondary | ICD-10-CM

## 2016-07-09 NOTE — Congregational Nurse Program (Unsigned)
Congregational Nurse Program Note  Date of Encounter05/09/2016 Past Medical History: Past Medical History:  Diagnosis Date  . Allergy   . History of TB (tuberculosis)   . Hypercholesteremia   . Hypertension   . Osteoarthritis   . Post-menopausal     Encounter Details:   Screen BP and CBG BP 199/105. Recheck by manual, 210/94. Said started BP meds 2weeks ago, check BP at home, 140's. Recheck BP @ 1225p, 175/94 CBG done, non fasting, 99

## 2016-07-12 DIAGNOSIS — H04123 Dry eye syndrome of bilateral lacrimal glands: Secondary | ICD-10-CM | POA: Diagnosis not present

## 2016-07-12 DIAGNOSIS — H01001 Unspecified blepharitis right upper eyelid: Secondary | ICD-10-CM | POA: Diagnosis not present

## 2016-07-12 DIAGNOSIS — H16103 Unspecified superficial keratitis, bilateral: Secondary | ICD-10-CM | POA: Diagnosis not present

## 2016-07-12 DIAGNOSIS — T1512XA Foreign body in conjunctival sac, left eye, initial encounter: Secondary | ICD-10-CM | POA: Diagnosis not present

## 2016-09-05 ENCOUNTER — Ambulatory Visit (INDEPENDENT_AMBULATORY_CARE_PROVIDER_SITE_OTHER): Payer: Medicare Other | Admitting: Adult Health

## 2016-09-05 ENCOUNTER — Encounter: Payer: Self-pay | Admitting: Adult Health

## 2016-09-05 VITALS — BP 122/80 | HR 69 | Temp 98.3°F | Ht 62.75 in | Wt 114.8 lb

## 2016-09-05 DIAGNOSIS — B070 Plantar wart: Secondary | ICD-10-CM

## 2016-09-05 DIAGNOSIS — I10 Essential (primary) hypertension: Secondary | ICD-10-CM

## 2016-09-05 DIAGNOSIS — M810 Age-related osteoporosis without current pathological fracture: Secondary | ICD-10-CM | POA: Diagnosis not present

## 2016-09-05 DIAGNOSIS — E782 Mixed hyperlipidemia: Secondary | ICD-10-CM

## 2016-09-05 DIAGNOSIS — Z1231 Encounter for screening mammogram for malignant neoplasm of breast: Secondary | ICD-10-CM | POA: Diagnosis not present

## 2016-09-05 DIAGNOSIS — E785 Hyperlipidemia, unspecified: Secondary | ICD-10-CM | POA: Insufficient documentation

## 2016-09-05 LAB — CBC WITH DIFFERENTIAL/PLATELET
BASOS PCT: 1 % (ref 0.0–3.0)
Basophils Absolute: 0 10*3/uL (ref 0.0–0.1)
EOS PCT: 2.3 % (ref 0.0–5.0)
Eosinophils Absolute: 0.1 10*3/uL (ref 0.0–0.7)
HCT: 40.3 % (ref 36.0–46.0)
Hemoglobin: 13.7 g/dL (ref 12.0–15.0)
Lymphocytes Relative: 37.6 % (ref 12.0–46.0)
Lymphs Abs: 1.8 10*3/uL (ref 0.7–4.0)
MCHC: 34.1 g/dL (ref 30.0–36.0)
MCV: 80 fl (ref 78.0–100.0)
MONO ABS: 0.3 10*3/uL (ref 0.1–1.0)
Monocytes Relative: 6.6 % (ref 3.0–12.0)
NEUTROS ABS: 2.5 10*3/uL (ref 1.4–7.7)
Neutrophils Relative %: 52.5 % (ref 43.0–77.0)
PLATELETS: 186 10*3/uL (ref 150.0–400.0)
RBC: 5.03 Mil/uL (ref 3.87–5.11)
RDW: 15.4 % (ref 11.5–15.5)
WBC: 4.9 10*3/uL (ref 4.0–10.5)

## 2016-09-05 LAB — TSH: TSH: 3.41 u[IU]/mL (ref 0.35–4.50)

## 2016-09-05 LAB — LIPID PANEL
CHOL/HDL RATIO: 4
CHOLESTEROL: 196 mg/dL (ref 0–200)
HDL: 54.1 mg/dL (ref >39.00)
LDL Cholesterol: 110 mg/dL — ABNORMAL HIGH (ref 0–99)
NonHDL: 141.46
TRIGLYCERIDES: 158 mg/dL — AB (ref 0.0–149.0)
VLDL: 31.6 mg/dL (ref 0.0–40.0)

## 2016-09-05 LAB — BASIC METABOLIC PANEL
BUN: 13 mg/dL (ref 6–23)
CHLORIDE: 104 meq/L (ref 96–112)
CO2: 27 meq/L (ref 19–32)
CREATININE: 0.58 mg/dL (ref 0.40–1.20)
Calcium: 9.7 mg/dL (ref 8.4–10.5)
GFR: 107.24 mL/min (ref >60.00)
GLUCOSE: 104 mg/dL — AB (ref 70–99)
POTASSIUM: 4 meq/L (ref 3.5–5.1)
Sodium: 140 mEq/L (ref 135–145)

## 2016-09-05 LAB — HEPATIC FUNCTION PANEL
ALT: 23 U/L (ref 0–35)
AST: 22 U/L (ref 0–37)
Albumin: 4.6 g/dL (ref 3.5–5.2)
Alkaline Phosphatase: 67 U/L (ref 39–117)
BILIRUBIN DIRECT: 0.1 mg/dL (ref 0.0–0.3)
BILIRUBIN TOTAL: 0.7 mg/dL (ref 0.2–1.2)
TOTAL PROTEIN: 8.2 g/dL (ref 6.0–8.3)

## 2016-09-05 LAB — VITAMIN D 25 HYDROXY (VIT D DEFICIENCY, FRACTURES): VITD: 46.36 ng/mL (ref 30.00–100.00)

## 2016-09-05 NOTE — Progress Notes (Signed)
Subjective:    Patient ID: Cassidy PeakChristine K Bennett, female    DOB: 12-18-1940, 76 y.o.   MRN: [id number]  HPI  Patient presents for yearly follow up examination. She is a pleasant 76 year old female who  has a past medical history of Allergy; History of TB (tuberculosis); Hypercholesteremia; Hypertension; Osteoarthritis; and Post-menopausal.  All immunizations and health maintenance protocols were reviewed with the patient and needed orders were placed.  Appropriate screening laboratory values were ordered for the patient including screening of hyperlipidemia, renal function and hepatic function.  Medication reconciliation,  past medical history, social history, problem list and allergies were reviewed in detail with the patient  Goals were established with regard to weight loss, exercise, and  diet in compliance with medications  End of life planning was discussed.Shehas an advanced directive and living will   She takes Atenolol 25 mg for hypertension and is controlled on this   She gets routine eye care, dental care, does not check her breasts monthly. Her last colonoscopy was in 2015. She had a bone density screen done in 07/2015 which showed osteoporosis . She would like to get a mammogram   Cognitive function normal [facility], home health safety reviewed no issues identified, no guns in the house  Review of Systems  Constitutional: Negative.   HENT: Negative.   Eyes: Negative.   Respiratory: Negative.   Cardiovascular: Negative.   Gastrointestinal: Negative.   Endocrine: Negative.   Genitourinary: Negative.   Musculoskeletal: Negative.   Skin: Negative.        Planter wart on right foot   Allergic/Immunologic: Negative.   Neurological: Negative.   Hematological: Negative.   Psychiatric/Behavioral: Positive for sleep disturbance.  All other systems reviewed and are negative.  Past Medical History:  Diagnosis Date  . Allergy   . History of TB  (tuberculosis)   . Hypercholesteremia   . Hypertension   . Osteoarthritis   . Post-menopausal     Social History   Social History  . Marital status: Married    Spouse name: N/A  . Number of children: N/A  . Years of education: N/A   Occupational History  . Not on file.   Social History Main Topics  . Smoking status: Never Smoker  . Smokeless tobacco: Never Used  . Alcohol use No  . Drug use: No  . Sexual activity: Not on file   Other Topics Concern  . Not on file   Social History Narrative  . No narrative on file    Past Surgical History:  Procedure Laterality Date  . CATARACT EXTRACTION  2018  . DILATION AND CURETTAGE OF UTERUS    . KNEE ARTHROSCOPY  2014   left  . LUMBAR DISC SURGERY    . SHOULDER SURGERY     right    Family History  Problem Relation Age of Onset  . Cancer Other        stomach  . Other Father        malaria    No Known Allergies  Current Outpatient Prescriptions on File Prior to Visit  Medication Sig Dispense Refill  . atenolol (TENORMIN) 25 MG tablet Take 1 tablet (25 mg total) by mouth daily. Half tab daily 90 tablet 1  . CALCIUM PO Take 1 tablet by mouth daily.     No current facility-administered medications on file prior to visit.     BP 122/80   Pulse 69   Temp 98.3  F (36.8 C) (Oral)   Ht 5' 2.75" (1.594 m)   Wt 114 lb 12.8 oz (52.1 kg)   BMI 20.50 kg/m        Objective:   Physical Exam  Constitutional: She is oriented to person, place, and time. She appears well-developed and well-nourished. No distress.  HENT:  Head: Normocephalic and atraumatic.  Right Ear: External ear normal.  Left Ear: External ear normal.  Nose: Nose normal.  Mouth/Throat: Oropharynx is clear and moist. No oropharyngeal exudate.  Eyes: Conjunctivae are normal. Right eye exhibits no discharge. Left eye exhibits no discharge. No scleral icterus.  Neck: Normal range of motion. Neck supple. No JVD present. No tracheal deviation present. No  thyromegaly present.  Cardiovascular: Normal rate, regular rhythm, normal heart sounds and intact distal pulses.  Exam reveals no gallop and no friction rub.   No murmur heard. Pulmonary/Chest: Effort normal and breath sounds normal. No respiratory distress. She has no wheezes. She has no rales. She exhibits no tenderness.  Abdominal: Soft. Bowel sounds are normal. She exhibits no distension and no mass. There is no tenderness. There is no rebound and no guarding.  Genitourinary:  Genitourinary Comments: Breast Exam: No masses, lumps, dimpling, or discharge   Musculoskeletal: Normal range of motion. She exhibits no edema or tenderness.  Lymphadenopathy:    She has no cervical adenopathy.  Neurological: She is alert and oriented to person, place, and time. She displays normal reflexes. No cranial nerve deficit. She exhibits normal muscle tone. Coordination normal.  Skin: Skin is warm and dry. No rash noted. No erythema. No pallor.  Planter wart on right ball of foot   Psychiatric: She has a normal mood and affect. Her behavior is normal. Judgment and thought content normal.  Nursing note and vitals reviewed.     Assessment & Plan:  1. Essential hypertension - Well controlled. No change in medication  - Basic metabolic panel - CBC with Differential/Platelet - Hepatic function panel - Lipid panel - TSH  2. Osteoporosis without current pathological fracture, unspecified osteoporosis type - Would like to do Prolia - will refer to RN  - Vitamin D, 25-hydroxy  3. Mixed hyperlipidemia - Consider adding statin  - Basic metabolic panel - CBC with Differential/Platelet - Hepatic function panel - Lipid panel - TSH  4. Plantar wart  - Ambulatory referral to Podiatry  5. Visit for screening mammogram  - MM DIGITAL SCREENING BILATERAL; Future  Shirline Frees, NP

## 2016-09-05 NOTE — Patient Instructions (Signed)
It was great seeing you today   I will follow up with you regarding your blood work   Someone will call you to schedule your appointment with podiatry   Try taking Melatonin for sleep issues   [facility]   Phone: [phone]) [phone]

## 2016-09-12 ENCOUNTER — Telehealth: Payer: Self-pay

## 2016-09-12 NOTE — Telephone Encounter (Signed)
Insurance will not cover her bone density until June 2019. If she does not want to go on Prolia at this time. Then I recommend Calcium 1200 mg and Vitamin D 800 units daily.

## 2016-09-12 NOTE — Telephone Encounter (Signed)
Called and spoke with patient as I received her Prolia Benefits Verification back. She states she would like to have another bone density done this year before initiating Prolia. I advised her I would let her PCP know.

## 2016-09-13 NOTE — Telephone Encounter (Signed)
Called and left a voicemail message asking for a return phone call 

## 2016-09-14 NOTE — Telephone Encounter (Signed)
Pt is returning jamie call °

## 2016-09-14 NOTE — Telephone Encounter (Signed)
Jamie pt is returning your call. °

## 2016-09-15 NOTE — Telephone Encounter (Signed)
Scheduled for Monday, September 18, 2016

## 2016-09-15 NOTE — Telephone Encounter (Signed)
Spoke with patient who wants to proceed with Prolia. I am scheduling her for injection.

## 2016-09-18 ENCOUNTER — Ambulatory Visit (INDEPENDENT_AMBULATORY_CARE_PROVIDER_SITE_OTHER): Payer: Medicare Other

## 2016-09-18 DIAGNOSIS — M810 Age-related osteoporosis without current pathological fracture: Secondary | ICD-10-CM | POA: Diagnosis not present

## 2016-09-18 MED ORDER — DENOSUMAB 60 MG/ML ~~LOC~~ SOLN
60.0000 mg | Freq: Once | SUBCUTANEOUS | Status: AC
  Administered 2016-09-18: 60 mg via SUBCUTANEOUS

## 2016-09-18 NOTE — Progress Notes (Signed)
Patient in today for Prolia injection. All questions answered prior to administering injection. Patient verbalized understanding. Patient tolerated injection well

## 2016-09-21 ENCOUNTER — Ambulatory Visit
Admission: RE | Admit: 2016-09-21 | Discharge: 2016-09-21 | Disposition: A | Discharge status: Home / Self Care | Payer: Medicare Other | Source: Ambulatory Visit | Attending: Adult Health | Admitting: Adult Health

## 2016-09-21 DIAGNOSIS — Z1231 Encounter for screening mammogram for malignant neoplasm of breast: Secondary | ICD-10-CM

## 2016-09-27 ENCOUNTER — Ambulatory Visit (INDEPENDENT_AMBULATORY_CARE_PROVIDER_SITE_OTHER): Payer: Medicare Other | Admitting: Podiatry

## 2016-09-27 ENCOUNTER — Encounter: Payer: Self-pay | Admitting: Podiatry

## 2016-09-27 DIAGNOSIS — Q828 Other specified congenital malformations of skin: Secondary | ICD-10-CM

## 2016-09-29 NOTE — Progress Notes (Signed)
   Subjective: Patient presents to the office today for chief complaint of painful callus lesions of the feet. Patient states that the pain is ongoing and is affecting their ability to ambulate without pain. Patient presents today for further treatment and evaluation.  Objective:  Physical Exam General: Alert and oriented x3 in no acute distress  Dermatology: Hyperkeratotic lesion present on the plantar aspect of the right sub-fifth MPJ. Pain on palpation with a central nucleated core noted.  Skin is warm, dry and supple bilateral lower extremities. Negative for open lesions or macerations.  Vascular: Palpable pedal pulses bilaterally. No edema or erythema noted. Capillary refill within normal limits.  Neurological: Epicritic and protective threshold grossly intact bilaterally.   Musculoskeletal Exam: Pain on palpation at the keratotic lesion noted. Range of motion within normal limits bilateral. Muscle strength 5/5 in all groups bilateral.  Assessment: #1 porokeratosis sub-fifth MPJ right foot   Plan of Care:  #1 Patient evaluated #2 Excisional debridement of keratoic lesion using a chisel blade was performed without incident.  #3 Treated area(s) with Salinocaine and dressed with light dressing. #4 Patient is to return to the clinic PRN.   Felecia ShellingBrent M. Evans, DPM [facility]  Dr. Felecia ShellingBrent M. Evans, DPM    [address]                                        [place], [place] [address]                Office [phone]) [phone]  Fax [phone]) [phone]

## 2016-12-26 DIAGNOSIS — Z23 Encounter for immunization: Secondary | ICD-10-CM | POA: Diagnosis not present

## 2016-12-29 ENCOUNTER — Ambulatory Visit (INDEPENDENT_AMBULATORY_CARE_PROVIDER_SITE_OTHER): Payer: Medicare Other | Admitting: Family Medicine

## 2016-12-29 ENCOUNTER — Encounter: Payer: Self-pay | Admitting: Family Medicine

## 2016-12-29 ENCOUNTER — Ambulatory Visit (INDEPENDENT_AMBULATORY_CARE_PROVIDER_SITE_OTHER)
Admission: RE | Admit: 2016-12-29 | Discharge: 2016-12-29 | Disposition: A | Discharge status: Home / Self Care | Payer: Medicare Other | Source: Ambulatory Visit | Attending: Family Medicine | Admitting: Family Medicine

## 2016-12-29 VITALS — BP 120/68 | HR 83 | Temp 98.2°F | Ht 62.75 in | Wt 118.4 lb

## 2016-12-29 DIAGNOSIS — M79674 Pain in right toe(s): Secondary | ICD-10-CM | POA: Diagnosis not present

## 2016-12-29 DIAGNOSIS — S92424A Nondisplaced fracture of distal phalanx of right great toe, initial encounter for closed fracture: Secondary | ICD-10-CM | POA: Diagnosis not present

## 2016-12-29 NOTE — Patient Instructions (Signed)
BEFORE YOU LEAVE: -xray sheet -follow up: 2 weeks  Warm salt water soaks once daily. Then dry and apply antibiotic ointment around ingrown toe nail.  Go get the xray. Further recommendations pending.  Tylenol or aleve if needed per instructions for pain.  Follow up sooner if worsening or new concerns. I hope you are feeling better soon.

## 2016-12-29 NOTE — Progress Notes (Signed)
HPI:  Acute visit for R great toe pain: -tripped over cord about 10 days ago -initially had severe pain, swelling of toe and anterior foot and bruising -much better but still with pain around nail bed great toe -no fevers, malaise, break in the skin  ROS: See pertinent positives and negatives per HPI.  Past Medical History:  Diagnosis Date  . Allergy   . History of TB (tuberculosis)   . Hypercholesteremia   . Hypertension   . Osteoarthritis   . Post-menopausal     Past Surgical History:  Procedure Laterality Date  . CATARACT EXTRACTION  2018  . DILATION AND CURETTAGE OF UTERUS    . KNEE ARTHROSCOPY  2014   left  . LUMBAR DISC SURGERY    . SHOULDER SURGERY     right    Family History  Problem Relation Age of Onset  . Other Father        malaria  . Cancer Other        stomach    Social History   Social History  . Marital status: Married    Spouse name: N/A  . Number of children: N/A  . Years of education: N/A   Social History Main Topics  . Smoking status: Never Smoker  . Smokeless tobacco: Never Used  . Alcohol use No  . Drug use: No  . Sexual activity: Not Asked   Other Topics Concern  . None   Social History Narrative  . None     Current Outpatient Prescriptions:  .  Ascorbic Acid (VITAMIN C PO), Take by mouth., Disp: , Rfl:  .  aspirin 81 MG chewable tablet, Chew by mouth daily., Disp: , Rfl:  .  atenolol (TENORMIN) 25 MG tablet, Take 1 tablet (25 mg total) by mouth daily. Half tab daily, Disp: 90 tablet, Rfl: 1 .  CALCIUM PO, Take 1 tablet by mouth daily., Disp: , Rfl:  .  Cholecalciferol (VITAMIN D PO), Take by mouth., Disp: , Rfl:  .  loratadine (CLARITIN) 10 MG tablet, Take 10 mg by mouth daily., Disp: , Rfl:  .  NON FORMULARY, Vitamin B, Disp: , Rfl:  .  Riboflavin (VITAMIN B2 PO), Take by mouth., Disp: , Rfl:  .  VITAMIN A PO, Take by mouth., Disp: , Rfl:  .  VITAMIN E PO, Take by mouth., Disp: , Rfl:   EXAM:  Vitals:   12/29/16  1534  BP: 120/68  Pulse: 83  Temp: 98.2 F (36.8 C)    Body mass index is 21.14 kg/m.  GENERAL: vitals reviewed and listed above, alert, oriented, appears well hydrated and in no acute distress  SKIN: ingrown toenail R great toe, no breaks in the skin, redness or edema around the nail  MS: moves all extremities without noticeable abnormality except she does have decreased movement in flexion and extension of the great toe on the right foot, she has focal tenderness to palpation on the dorsal aspect of the toe just proximal to the nail base, normal cap refill, no other significant findings on exam of the foot in question  PSYCH: pleasant and cooperative, no obvious depression or anxiety  ASSESSMENT AND PLAN:  Discussed the following assessment and plan:  Pain of toe of right foot - Plan: DG Foot Complete Right  -Given initial significant swelling and bruising, if fracture is possible, we will obtain plain films given her ongoing pain -She has an ingrown toenail, I don't see any signs of a significant paronychia  today, but she could be developing one - treat conservatively with warm water soaks and topical antibiotic ointment. -Follow up in 2 weeks -Conservative, as needed, management of pain with over-the-counter analgesics -Patient advised to return sooner if symptoms worsen or new concerns arise.  Patient Instructions  BEFORE YOU LEAVE: -xray sheet -follow up: 2 weeks  Warm salt water soaks once daily. Then dry and apply antibiotic ointment around ingrown toe nail.  Go get the xray. Further recommendations pending.  Tylenol or aleve if needed per instructions for pain.  Follow up sooner if worsening or new concerns. I hope you are feeling better soon.   Kriste BasqueKIM, Berl Bonfanti R., DO

## 2017-01-01 ENCOUNTER — Other Ambulatory Visit: Payer: Self-pay | Admitting: *Deleted

## 2017-01-01 DIAGNOSIS — S92491A Other fracture of right great toe, initial encounter for closed fracture: Secondary | ICD-10-CM

## 2017-01-03 ENCOUNTER — Ambulatory Visit (INDEPENDENT_AMBULATORY_CARE_PROVIDER_SITE_OTHER): Payer: Medicare Other | Admitting: Sports Medicine

## 2017-01-03 ENCOUNTER — Encounter: Payer: Self-pay | Admitting: Sports Medicine

## 2017-01-03 VITALS — BP 150/86 | HR 76 | Ht 62.75 in | Wt 116.8 lb

## 2017-01-03 DIAGNOSIS — M75101 Unspecified rotator cuff tear or rupture of right shoulder, not specified as traumatic: Secondary | ICD-10-CM | POA: Diagnosis not present

## 2017-01-03 DIAGNOSIS — S92411A Displaced fracture of proximal phalanx of right great toe, initial encounter for closed fracture: Secondary | ICD-10-CM | POA: Diagnosis not present

## 2017-01-03 NOTE — Patient Instructions (Addendum)

## 2017-01-03 NOTE — Procedures (Signed)

## 2017-01-03 NOTE — Progress Notes (Signed)
OFFICE VISIT NOTE Cassidy Bennett. [facility] [phone]  Cassidy Bennett - 76 y.o. female MRN [id number]  Date of birth: 1940/09/28  Visit Date: 01/03/2017  PCP: Cassidy Frees, NP   Referred by: Cassidy Frees, NP  Cassidy Bennett, CMA acting as scribe for Cassidy Bennett.  SUBJECTIVE:   Chief Complaint  Patient presents with  . New Patient (Initial Visit)    RT great toe fracture   HPI: As below and per problem based documentation when appropriate.  [name] is a new patient presenting today for evaluation of RT great toe pain. She has noticed swelling around the toe.  Pain started around 12/20/16 after tripping over a cord.   The pain is described as tenderness to palpation and is rated as 5/10. She does notice some aching in the toe first thing in the morning.   Worsened with standing for prolonged periods of time. She does have pain while wearing shoes unless they are wide.  Improves with rest but does not completely resolve.  Therapies tried include : She has tried icing with some relief. She has also tried taking Advil with some relief.   Other associated symptoms include: Initially she has pain and bruising on the top of the foot but that has resolved.   She was seen 12/29/16 at [facility] [place] and had xray done.   Pt also c/o pain in the RT shoulder x 2 months. She had surgery on that shoulder about 20 years ago, she thinks it may have been for her rotator cuff. She has noticed decreased ROM. Pain is mostly on the posterior aspect of the shoulder. Pain is worse when raising arm over head and knitting. Pain resolves at rest.     Review of Systems  Constitutional: Negative for chills, fever and malaise/fatigue.  Respiratory: Negative for shortness of breath and wheezing.   Cardiovascular: Negative for chest pain, palpitations and leg swelling.  Musculoskeletal: Positive for joint pain.  Neurological:  Negative for dizziness, tingling, weakness and headaches.    Otherwise per HPI.  HISTORY & PERTINENT PRIOR DATA:  No specialty comments available. She reports that  has never smoked. she has never used smokeless tobacco. No results for input(s): HGBA1C, LABURIC, CREATINE in the last 8760 hours.  Invalid input(s): CR Allergies reviewed per EMR Prior to Admission medications   Medication Sig Start Date End Date Taking? Authorizing Provider  Ascorbic Acid (VITAMIN C PO) Take by mouth.   Yes [provider]  aspirin 81 MG chewable tablet Chew by mouth daily.   Yes [provider]  atenolol (TENORMIN) 25 MG tablet Take 1 tablet (25 mg total) by mouth daily. Half tab daily 06/13/16  Yes Nafziger, Kandee Keen, NP  CALCIUM PO Take 1 tablet by mouth daily.   Yes [provider]  Cholecalciferol (VITAMIN D PO) Take by mouth.   Yes [provider]  loratadine (CLARITIN) 10 MG tablet Take 10 mg by mouth daily.   Yes [provider]  NON FORMULARY Vitamin B   Yes [provider]  Riboflavin (VITAMIN B2 PO) Take by mouth.   Yes [provider]  VITAMIN A PO Take by mouth.   Yes [provider]  VITAMIN E PO Take by mouth.   Yes [provider]   Patient Active Problem List   Diagnosis Date Noted  . Closed displaced fracture of proximal phalanx of right great toe 01/03/2017  . Rotator  cuff syndrome of right shoulder 01/03/2017  . Hyperlipidemia 09/05/2016  . Essential hypertension 08/27/2015  . Urine abnormality 03/29/2015  . Routine general medical examination at a health care facility 06/26/2013  . History of TB (tuberculosis) 06/17/2012  . ALLERGIC RHINITIS 01/22/2008  . Osteoporosis 12/13/2006  . OSTEOARTHRITIS 10/11/2006   Past Medical History:  Diagnosis Date  . Allergy   . History of TB (tuberculosis)   . Hypercholesteremia   . Hypertension   . Osteoarthritis   . Post-menopausal    Family History  Problem  Relation Age of Onset  . Other Father        malaria  . Cancer Other        stomach   Past Surgical History:  Procedure Laterality Date  . CATARACT EXTRACTION  2018  . DILATION AND CURETTAGE OF UTERUS    . KNEE ARTHROSCOPY  2014   left  . LUMBAR DISC SURGERY    . SHOULDER SURGERY     right   Social History   Occupational History  . Not on file  Tobacco Use  . Smoking status: Never Smoker  . Smokeless tobacco: Never Used  Substance and Sexual Activity  . Alcohol use: No  . Drug use: No  . Sexual activity: Not on file    OBJECTIVE:  VS:  HT:5' 2.75" (159.4 cm)   WT:116 lb 12.8 oz (53 kg)  BMI:20.85    BP:(!) 150/86  HR:76bpm  TEMP: ( )  RESP:98 % EXAM: Findings:  WDWN, NAD, Non-toxic appearing Alert & appropriately interactive Not depressed or anxious appearing No increased work of breathing. Pupils are equal. EOM intact without nystagmus No clubbing or cyanosis of the extremities appreciated No significant rashes/lesions/ulcerations overlying the examined area. Radial pulses 2+/4.  No significant generalized UE edema. DP & PT pulses 2+/4.  No significant pretibial edema.  No clubbing or cyanosis Sensation intact to light touch in upper and lower extremities.  Right shoulder:  Limited overhead range of motion by approximately 20 degrees.  Internal rotation, external rotation, empty can, O'Briens and speeds testing strength is 5 out of 5 but she does have pain with internal rotation.  Mild pain with axial load and circumduction.   Right great toe: Moderate degree of swelling.  She is markedly tender over the IP joint.  Pain with valgus stressing but this is mild.  No significant skin breakdown.     RADIOLOGY: DG Foot Complete Right CLINICAL DATA:  Tripped and fell 10 days ago with persistent pain and swelling centered on the great toe.  EXAM: RIGHT FOOT COMPLETE - 3+ VIEW  COMPARISON:  None in PACs  FINDINGS: The bones are subjectively adequately  mineralized. There is an acute avulsion from the dorsal aspect of the base of the distal phalanx of the great toe. The fracture involves the articular surface. Elsewhere the bones of the great toe are intact. The IP joint and the first MTP joint are normal. The other phalanges are intact as are the metatarsals. The bones of the hindfoot exhibit no acute abnormalities. There are plantar and Achilles region calcaneal spurs.  IMPRESSION: There is an acute fracture through the dorsal aspect of the base of the distal phalanx of the right great toe. The fracture does involve the articular surface.  Electronically Signed   By: Cassidy  Bennett M.D.   On: 01/01/2017 07:12  ASSESSMENT & PLAN:     ICD-10-CM   1. Closed displaced fracture of proximal phalanx of right great toe,  initial encounter S92.411A   2. Rotator cuff syndrome of right shoulder M75.101 Misc procedure   ================================================================= Rotator cuff syndrome of right shoulder Sub acromial injection today. If any lack of improvement MSK ultrasound and consider intra-articular injection for possible early frozen shoulder. Therapeutic exercises per procedure note.  ++++++++++++++++++++++++++++++++++++++++++++ PROCEDURE NOTE: RIGHT SUBACROMIALINJECTION   DESCRIPTION OF PROCEDURE:  The patient's clinical condition is marked by substantial pain and/or significant functional disability. Other conservative therapy has not provided relief, is contraindicated, or not appropriate. There is a reasonable likelihood that injection will significantly improve the patient's pain and/or functional impairment. After discussing the risks, benefits and expected outcomes of the injection and all questions were reviewed and answered, the patient wished to undergo the above named procedure. Verbal consent was obtained. The target structure was injected under direct visualization using sterile technique as  below: PREP: Alcohol, Ethel Chloride APPROACH: Posterior,Single injection, 22g 1.5" needle INJECTATE: 2 cc 0.5% marcaine, 2cc 40mg  DepoMedrol DRESSING: Band-Aid  Post procedural instructions including recommending icing and warning signs for infection were reviewed. This procedure was well tolerated and there were no complications.      Closed displaced fracture of proximal phalanx of right great toe Minimally displaced capsular avulsion fracture.  We will put her into a postoperative shoe for symptom medic control and follow-up in 4 weeks.  Should show gradual slow improvement and if any lack of improvement will need repeat imaging otherwise if she is doing fine no repeat imaging will be indicated.  PROCEDURE NOTE: THERAPEUTIC EXERCISES (97110) 15 minutes spent for Therapeutic exercises as below and as referenced in the AVS. This included exercises focusing on stretching, strengthening, with significant focus on eccentric aspects.  Proper technique shown and discussed handout in great detail with ATC. All questions were discussed and answered.   Long term goals include an improvement in range of motion, strength, endurance as well as avoiding reinjury. Frequency of visits is one time as determined during today's  office visit. Frequency of exercises to be performed is as per handout.  EXERCISES REVIEWED:  intrinisic rotator cuff  ROM  ================================================================= Patient Instructions  You had an injection today.  Things to be aware of after injection are listed below: . You may experience no significant improvement or even a slight worsening in your symptoms during the first 24 to 48 hours.  After that we expect your symptoms to improve gradually over the next 2 weeks for the medicine to have its maximal effect.  You should continue to have improvement out to 6 weeks after your injection. . Dr. Berline Choughigby recommends icing the site of the injection for  20 minutes  1-2 times the day of your injection . You may shower but no swimming, tub bath or Jacuzzi for 24 hours. . If your bandage falls off this does not need to be replaced.  It is appropriate to remove the bandage after 4 hours. . You may resume light activities as tolerated unless otherwise directed per Dr. Berline Choughigby during your visit  POSSIBLE STEROID SIDE EFFECTS:  Side effects from injectable steroids tend to be less than when taken orally however you may experience some of the symptoms listed below.  If experienced these should only last for a short period of time. Change in menstrual flow  Edema (swelling)  Increased appetite Skin flushing (redness)  Skin rash/acne  Thrush (oral) Yeast vaginitis    Increased sweating  Depression Increased blood glucose levels Cramping and leg/calf  Euphoria (feeling happy)  POSSIBLE PROCEDURE  SIDE EFFECTS: The side effects of the injection are usually fairly minimal however if you may experience some of the following side effects that are usually self-limited and will is off on their own.  If you are concerned please feel free to call the office with questions:  Increased numbness or tingling  Nausea or vomiting  Swelling or bruising at the injection site   Please call our office if if you experience any of the following symptoms over the next week as these can be signs of infection:   Fever greater than 100.60F  Significant swelling at the injection site  Significant redness or drainage from the injection site  If after 2 weeks you are continuing to have worsening symptoms please call our office to discuss what the next appropriate actions should be including the potential for a return office visit or other diagnostic testing.  Please perform the exercise program that we have prepared for you and gone over in detail on a daily basis.  In addition to the handout you were provided you can access your program through: [email or web address]   Your  unique program code [id number]      ================================================================= No future appointments.  Follow-up: Return in about 4 weeks (around 01/31/2017).   CMA/ATC served as Neurosurgeonscribe during this visit. History, Physical, and Plan performed by medical provider. Documentation and orders reviewed and attested to.      Cassidy BiddingMichael Rigby, DO    [facility]

## 2017-01-03 NOTE — Assessment & Plan Note (Addendum)
Sub acromial injection today. If any lack of improvement MSK ultrasound and consider intra-articular injection for possible early frozen shoulder. Therapeutic exercises per procedure note.  ++++++++++++++++++++++++++++++++++++++++++++ PROCEDURE NOTE: RIGHT SUBACROMIALINJECTION   DESCRIPTION OF PROCEDURE:  The patient's clinical condition is marked by substantial pain and/or significant functional disability. Other conservative therapy has not provided relief, is contraindicated, or not appropriate. There is a reasonable likelihood that injection will significantly improve the patient's pain and/or functional impairment. After discussing the risks, benefits and expected outcomes of the injection and all questions were reviewed and answered, the patient wished to undergo the above named procedure. Verbal consent was obtained. The target structure was injected under direct visualization using sterile technique as below: PREP: Alcohol, Ethel Chloride APPROACH: Posterior,Single injection, 22g 1.5" needle INJECTATE: 2 cc 0.5% marcaine, 2cc 40mg  DepoMedrol DRESSING: Band-Aid  Post procedural instructions including recommending icing and warning signs for infection were reviewed. This procedure was well tolerated and there were no complications.

## 2017-01-03 NOTE — Assessment & Plan Note (Signed)
Minimally displaced capsular avulsion fracture.  We will put her into a postoperative shoe for symptom medic control and follow-up in 4 weeks.  Should show gradual slow improvement and if any lack of improvement will need repeat imaging otherwise if she is doing fine no repeat imaging will be indicated.

## 2017-01-10 DIAGNOSIS — H04123 Dry eye syndrome of bilateral lacrimal glands: Secondary | ICD-10-CM | POA: Diagnosis not present

## 2017-01-10 DIAGNOSIS — H25812 Combined forms of age-related cataract, left eye: Secondary | ICD-10-CM | POA: Diagnosis not present

## 2017-01-10 DIAGNOSIS — H524 Presbyopia: Secondary | ICD-10-CM | POA: Diagnosis not present

## 2017-01-10 DIAGNOSIS — H16103 Unspecified superficial keratitis, bilateral: Secondary | ICD-10-CM | POA: Diagnosis not present

## 2017-01-26 ENCOUNTER — Other Ambulatory Visit: Payer: Self-pay | Admitting: Adult Health

## 2017-01-26 DIAGNOSIS — I10 Essential (primary) hypertension: Secondary | ICD-10-CM

## 2017-01-26 MED ORDER — ATENOLOL 25 MG PO TABS: 25.0000 mg | ORAL_TABLET | Freq: Every day | ORAL | 1 refills | Status: DC

## 2017-01-31 ENCOUNTER — Encounter: Payer: Self-pay | Admitting: Sports Medicine

## 2017-01-31 ENCOUNTER — Ambulatory Visit (INDEPENDENT_AMBULATORY_CARE_PROVIDER_SITE_OTHER): Payer: Medicare Other | Admitting: Sports Medicine

## 2017-01-31 VITALS — BP 130/74 | HR 79 | Ht 62.75 in | Wt 117.4 lb

## 2017-01-31 DIAGNOSIS — S92411D Displaced fracture of proximal phalanx of right great toe, subsequent encounter for fracture with routine healing: Secondary | ICD-10-CM

## 2017-01-31 DIAGNOSIS — M75101 Unspecified rotator cuff tear or rupture of right shoulder, not specified as traumatic: Secondary | ICD-10-CM

## 2017-01-31 NOTE — Progress Notes (Signed)
Cassidy Bennett D. Cassidy Shinerigby, DO  [facility] [phone]  Cassidy Bennett K Bennett - 76 y.o. female MRN [id number]  Date of birth: May 24, 1940   Scribe for today's visit: Cassidy ClinchBrandy Bennett, CMA    SUBJECTIVE:  Cassidy Bennett is here for Follow-up (great toe fracture)  Cassidy Bennett is a new patient presenting today for evaluation of RT great toe pain. She has noticed swelling around the toe.  Pain started around 12/20/16 after tripping over a cord.  Therapies tried include : She has tried icing with some relief. She has also tried taking Advil with some relief.  Other associated symptoms include: Initially she has pain and bruising on the top of the foot but that has resolved.  She was seen 12/29/16 at [facility] [place] and had xray done.   Pt also c/o pain in the RT shoulder x 2 months. She had surgery on that shoulder about 20 years ago, she thinks it may have been for her rotator cuff. She has noticed decreased ROM. Pain is mostly on the posterior aspect of the shoulder. Pain is worse when raising arm over head and knitting. Pain resolves at rest.   Compared to the last office visit, her previously described symptoms are improving, she stopped wearing her post op shoe this past Sunday. She has noticed continued redness around the great toe however it is improved Current symptoms are mild. She has noticed that the post op boot causes her to walk funny because it is higher than her other shoes. This is causing her to have pain in the left knee and hip.  This is improved while not wearing the shoe. She has been buddy taping the toes and taking Advil prn.     ROS Denies night time disturbances. Denies fevers, chills, but she does report night sweats. Denies unexplained weight loss. Denies personal history of cancer. Denies changes in bowel or bladder habits. Denies recent unreported falls. Denies new or worsening dyspnea or wheezing. Reports  headaches or dizziness.  Denies numbness, tingling or weakness  In the extremities.  Denies dizziness or presyncopal episodes Denies lower extremity edema    HISTORY & PERTINENT PRIOR DATA:  Prior History reviewed and updated per electronic medical record. Significant history, findings, studies and interim changes include: No additional findings.  reports that  has never smoked. she has never used smokeless tobacco. No results for input(s): HGBA1C, LABURIC, CREATINE in the last 8760 hours. No problems updated.   OBJECTIVE:  VS:  HT:5' 2.75" (159.4 cm)   WT:117 lb 6.4 oz (53.3 kg)  BMI:20.96    BP:130/74  HR:79bpm  TEMP: ( )  RESP:97 %  PHYSICAL EXAM: Constitutional: WDWN, Non-toxic appearing. Psychiatric: Alert & appropriately interactive. Not depressed or anxious appearing. Respiratory: No increased work of breathing. Trachea Midline Eyes: Pupils are equal. EOM intact without nystagmus. No scleral icterus  Right great toe is overall well aligned.  Skin and swelling is significantly improved.  She has only a small amount of pain with terminal flexion and extension of the IP and MCP joint.  She is ligamentously stable.  No significant erythema or rubor.  Overhead range of motion of the shoulder is improved.  Negative Hawkins.   ASSESSMENT & PLAN:   1. Closed displaced fracture of proximal phalanx of right great toe with routine healing, subsequent encounter   2. Rotator cuff syndrome of right shoulder    Plan: Overall she is doing quite well with the toe fracture.  Her shoulder is also symptomatically better.  We will have her continue with therapeutic exercises in her left knee as tolerated.   Okay to discontinue the postoperative shoe at this time. Okay to use Coban for IP joint mobilization but okay to discontinue this as tolerated.   ++++++++++++++++++++++++++++++++++++++++++++ Follow-up: Return if symptoms worsen or fail to improve.   Pertinent documentation may be  included in additional procedure notes, imaging studies, problem based documentation and patient instructions. Please see these sections of the encounter for additional information regarding this visit. CMA/ATC served as Neurosurgeonscribe during this visit. History, Physical, and Plan performed by medical provider. Documentation and orders reviewed and attested to.      Andrena MewsMichael D Rigby, DO    [facility]

## 2017-03-14 ENCOUNTER — Telehealth: Payer: Self-pay | Admitting: Adult Health

## 2017-03-14 NOTE — Telephone Encounter (Signed)
Left message on voicemail to call office. Pt is due for prolia injection on 03/22/17, called to schedule for appt. Summary of benefits state she has a $0 co-pay if she has meet her $185 insurance deducible. Will call pt back at a later time.

## 2017-03-16 NOTE — Telephone Encounter (Signed)
Patient scheduled prolia appointment with the nurse for 03/26/2017 at 1:45pm as per patient request.

## 2017-03-26 ENCOUNTER — Ambulatory Visit (INDEPENDENT_AMBULATORY_CARE_PROVIDER_SITE_OTHER): Payer: Medicare Other | Admitting: Family Medicine

## 2017-03-26 DIAGNOSIS — M810 Age-related osteoporosis without current pathological fracture: Secondary | ICD-10-CM

## 2017-03-26 MED ORDER — DENOSUMAB 60 MG/ML ~~LOC~~ SOLN
60.0000 mg | Freq: Once | SUBCUTANEOUS | Status: AC
  Administered 2017-03-26: 60 mg via SUBCUTANEOUS

## 2017-03-26 NOTE — Progress Notes (Signed)
Per orders of Shirline Freesory Nafziger NP, injection of prolia given by Aniceto BossNIMMONS, Ricki Vanhandel ANN. Patient tolerated injection well.

## 2017-03-30 DIAGNOSIS — S76311A Strain of muscle, fascia and tendon of the posterior muscle group at thigh level, right thigh, initial encounter: Secondary | ICD-10-CM | POA: Diagnosis not present

## 2017-03-30 DIAGNOSIS — M25552 Pain in left hip: Secondary | ICD-10-CM | POA: Diagnosis not present

## 2017-04-01 DIAGNOSIS — M25552 Pain in left hip: Secondary | ICD-10-CM | POA: Insufficient documentation

## 2017-04-13 ENCOUNTER — Encounter: Payer: Self-pay | Admitting: Adult Health

## 2017-04-13 ENCOUNTER — Ambulatory Visit (INDEPENDENT_AMBULATORY_CARE_PROVIDER_SITE_OTHER): Payer: Medicare Other | Admitting: Adult Health

## 2017-04-13 VITALS — BP 166/94 | Temp 98.2°F | Wt 116.0 lb

## 2017-04-13 DIAGNOSIS — M7631 Iliotibial band syndrome, right leg: Secondary | ICD-10-CM

## 2017-04-13 NOTE — Progress Notes (Signed)
Subjective:    Patient ID: Cassidy Bennett, female    DOB: [birth year], 77 y.o.   MRN: [id number]  HPI  77 year old female who  has a past medical history of Allergy, History of TB (tuberculosis), Hypercholesteremia, Hypertension, Osteoarthritis, and Post-menopausal.   She presents to the office today the acute complaint of two days of right sided knee pain that radiates into the thigh and hip. She denies any redness, warmth, or swelling. Has not experienced any trauma to the area but does report pain after walking and extended time.   Pain is worse when lying down and more severe at the lateral aspect of the knee  She has been medicating with tylenol and motrin without much relief band syndrome   Review of Systems See HPI   Past Medical History:  Diagnosis Date  . Allergy   . History of TB (tuberculosis)   . Hypercholesteremia   . Hypertension   . Osteoarthritis   . Post-menopausal     Social History   Socioeconomic History  . Marital status: Married    Spouse name: Not on file  . Number of children: Not on file  . Years of education: Not on file  . Highest education level: Not on file  Social Needs  . Financial resource strain: Not on file  . Food insecurity - worry: Not on file  . Food insecurity - inability: Not on file  . Transportation needs - medical: Not on file  . Transportation needs - non-medical: Not on file  Occupational History  . Not on file  Tobacco Use  . Smoking status: Never Smoker  . Smokeless tobacco: Never Used  Substance and Sexual Activity  . Alcohol use: No  . Drug use: No  . Sexual activity: Not on file  Other Topics Concern  . Not on file  Social History Narrative  . Not on file    Past Surgical History:  Procedure Laterality Date  . CATARACT EXTRACTION  2018  . DILATION AND CURETTAGE OF UTERUS    . KNEE ARTHROSCOPY  2014   left  . LUMBAR DISC SURGERY    . SHOULDER SURGERY     right    Family History  Problem  Relation Age of Onset  . Other Father        malaria  . Cancer Other        stomach    No Known Allergies  Current Outpatient Medications on File Prior to Visit  Medication Sig Dispense Refill  . Ascorbic Acid (VITAMIN C PO) Take by mouth.    Marland Kitchen. aspirin 81 MG chewable tablet Chew by mouth daily.    Marland Kitchen. atenolol (TENORMIN) 25 MG tablet Take 1 tablet (25 mg total) by mouth daily. Half tab daily 90 tablet 1  . CALCIUM PO Take 1 tablet by mouth daily.    . Cholecalciferol (VITAMIN D PO) Take by mouth.    . loratadine (CLARITIN) 10 MG tablet Take 10 mg by mouth daily.    . NON FORMULARY Vitamin B    . Riboflavin (VITAMIN B2 PO) Take by mouth.    Marland Kitchen. VITAMIN A PO Take by mouth.    Marland Kitchen. VITAMIN E PO Take by mouth.     No current facility-administered medications on file prior to visit.     BP (!) 166/94 (BP Location: Left Arm)   Temp 98.2 F (36.8 C) (Oral)   Wt 116 lb (52.6 kg)   BMI 20.71 kg/m  Objective:   Physical Exam  Constitutional: She is oriented to person, place, and time. She appears well-developed and well-nourished. No distress.  Musculoskeletal: Normal range of motion. She exhibits tenderness. She exhibits no edema.  Pain with palpation from lateral aspect of right thigh from the iliac crest to the proximal tibea  Neurological: She is alert and oriented to person, place, and time.  Skin: Skin is warm and dry. No rash noted. She is not diaphoretic. No erythema. No pallor.  Psychiatric: She has a normal mood and affect. Her behavior is normal. Judgment and thought content normal.  Nursing note and vitals reviewed.     Assessment & Plan:  1. It band syndrome, right - Continue with motrin  - ice  - massage - stretching - follow up if no improvement  - Consider PT   Shirline Frees, NP

## 2017-04-15 ENCOUNTER — Emergency Department (HOSPITAL_COMMUNITY)
Admission: EM | Admit: 2017-04-15 | Discharge: 2017-04-15 | Disposition: A | Discharge status: Home / Self Care | Payer: Medicare Other | Attending: Emergency Medicine | Admitting: Emergency Medicine

## 2017-04-15 ENCOUNTER — Emergency Department (HOSPITAL_COMMUNITY): Payer: Medicare Other

## 2017-04-15 ENCOUNTER — Encounter (HOSPITAL_COMMUNITY): Payer: Self-pay

## 2017-04-15 DIAGNOSIS — Z7982 Long term (current) use of aspirin: Secondary | ICD-10-CM | POA: Diagnosis not present

## 2017-04-15 DIAGNOSIS — Z789 Other specified health status: Secondary | ICD-10-CM | POA: Insufficient documentation

## 2017-04-15 DIAGNOSIS — M79604 Pain in right leg: Secondary | ICD-10-CM | POA: Diagnosis not present

## 2017-04-15 DIAGNOSIS — M79651 Pain in right thigh: Secondary | ICD-10-CM | POA: Diagnosis not present

## 2017-04-15 DIAGNOSIS — M25561 Pain in right knee: Secondary | ICD-10-CM | POA: Diagnosis not present

## 2017-04-15 DIAGNOSIS — I10 Essential (primary) hypertension: Secondary | ICD-10-CM

## 2017-04-15 MED ORDER — ATENOLOL 25 MG PO TABS
12.5000 mg | ORAL_TABLET | Freq: Once | ORAL | Status: AC
  Administered 2017-04-15: 12.5 mg via ORAL
  Filled 2017-04-15: qty 1

## 2017-04-15 MED ORDER — OXYCODONE-ACETAMINOPHEN 5-325 MG PO TABS
1.0000 | ORAL_TABLET | Freq: Once | ORAL | Status: AC
  Administered 2017-04-15: 1 via ORAL
  Filled 2017-04-15: qty 1

## 2017-04-15 MED ORDER — CYCLOBENZAPRINE HCL 10 MG PO TABS: 5.0000 mg | ORAL_TABLET | Freq: Two times a day (BID) | ORAL | 0 refills | Status: DC | PRN

## 2017-04-15 NOTE — ED Notes (Signed)
Patient given crackers, peanut butter, and gingerale-tolerating well.

## 2017-04-15 NOTE — Discharge Instructions (Signed)
Take one half of cyclobenzaprine tablet up to three times per day for pain. Return immediately if loss of strength or incontinence of bowel or bladder. Recheck with your doctor 1-2 days.

## 2017-04-15 NOTE — ED Triage Notes (Signed)
Pt c/o pain in r thigh and knee x 1 week.  Reports went to pcp Friday and was told to rest it.  Pt says is no better.

## 2017-04-15 NOTE — ED Provider Notes (Signed)
[facility] EMERGENCY DEPARTMENT Provider Note   CSN: [id number] Arrival date & time: 04/15/17  [id number]     History   Chief Complaint Chief Complaint  Patient presents with  . Leg Pain    HPI Lonie PeakChristine K Varone is a 77 y.o. female.  HPI  77 y.o female complaining of righ tknee pain radiating to right thigh and hip began last Monday.  She normally wlaks daily and states she walked 1-2 hours the day prior.  Pain came on acutely on Monday while working around house, no injury.  Laid down and took advil without relief.  Pain is tight in nature and 9/10 when lying down, 8/10 when up.  Now with pain anterior knee.  No swelling, redness, or warmth.  Patient has had recent pain left hip and seen by Dr. Lequita HaltAluisio 2-3 weeks ago and treated with conservatively with exercises and pain resolved without intervention.  That pain was in left gluteus and has not returned.  [facility] and [facility] and told to rest, no intervention.  Pain continues severe despite vicodin (1), advil, and tylenol all without relief.  Took 2 tylenol and one advil this am.  Right low back pain associated- ls x-Harjot Dibello by Dr. Lequita HaltAluisio and told back ok. No numnbess, some paresthesia right anterior thigh last night.  Notes some difficultywith movemtn attributes to pain.  OUt of rx meds for one week and did not take tenormin for week.   Past Medical History:  Diagnosis Date  . Allergy   . History of TB (tuberculosis)   . Hypercholesteremia   . Hypertension   . Osteoarthritis   . Post-menopausal     Patient Active Problem List   Diagnosis Date Noted  . Closed displaced fracture of proximal phalanx of right great toe 01/03/2017  . Rotator cuff syndrome of right shoulder 01/03/2017  . Hyperlipidemia 09/05/2016  . Essential hypertension 08/27/2015  . Urine abnormality 03/29/2015  . Routine general medical examination at a health care facility 06/26/2013  . History of TB (tuberculosis) 06/17/2012  .  ALLERGIC RHINITIS 01/22/2008  . Osteoporosis 12/13/2006  . OSTEOARTHRITIS 10/11/2006    Past Surgical History:  Procedure Laterality Date  . CATARACT EXTRACTION  2018  . DILATION AND CURETTAGE OF UTERUS    . KNEE ARTHROSCOPY  2014   left  . LUMBAR DISC SURGERY    . SHOULDER SURGERY     right    OB History    No data available       Home Medications    Prior to Admission medications   Medication Sig Start Date End Date Taking? Authorizing Provider  Ascorbic Acid (VITAMIN C PO) Take by mouth.    [provider]  aspirin 81 MG chewable tablet Chew by mouth daily.    [provider]  atenolol (TENORMIN) 25 MG tablet Take 1 tablet (25 mg total) by mouth daily. Half tab daily 01/26/17   Nafziger, Kandee Keenory, NP  CALCIUM PO Take 1 tablet by mouth daily.    [provider]  Cholecalciferol (VITAMIN D PO) Take by mouth.    [provider]  loratadine (CLARITIN) 10 MG tablet Take 10 mg by mouth daily.    [provider]  NON FORMULARY Vitamin B    [provider]  Riboflavin (VITAMIN B2 PO) Take by mouth.    [provider]  VITAMIN A PO Take by mouth.    [provider]  VITAMIN E PO Take by  mouth.    [provider]    Family History Family History  Problem Relation Age of Onset  . Other Father        malaria  . Cancer Other        stomach    Social History Social History   Tobacco Use  . Smoking status: Never Smoker  . Smokeless tobacco: Never Used  Substance Use Topics  . Alcohol use: No  . Drug use: No     Allergies   Patient has no known allergies.   Review of Systems Review of Systems   Physical Exam Updated Vital Signs BP (!) 170/80 (BP Location: Right Arm)   Pulse 85   Temp 97.9 F (36.6 C) (Oral)   Resp 18   Ht 1.575 m (5\' 2" )   Wt 52.6 kg (116 lb)   SpO2 99%   BMI 21.22 kg/m   Physical Exam  Constitutional: She is oriented to person, place, and time. She appears  well-developed and well-nourished. No distress.  HENT:  Head: Normocephalic and atraumatic.  Right Ear: External ear normal.  Left Ear: External ear normal.  Nose: Nose normal.  Eyes: Conjunctivae and EOM are normal. Pupils are equal, round, and reactive to light.  Neck: Normal range of motion. Neck supple.  Cardiovascular: Normal rate and regular rhythm.  Pulmonary/Chest: Effort normal.  Abdominal: Soft. Bowel sounds are normal.  Musculoskeletal: Normal range of motion.       Legs: Neurological: She is alert and oriented to person, place, and time. She exhibits normal muscle tone. Coordination normal.  Skin: Skin is warm and dry.  Psychiatric: She has a normal mood and affect. Her behavior is normal. Thought content normal.  Nursing note and vitals reviewed.    ED Treatments / Results  Labs (all labs ordered are listed, but only abnormal results are displayed) Labs Reviewed - No data to display  EKG  EKG Interpretation None       Radiology Dg Knee Complete 4 Views Right  Result Date: 04/15/2017 CLINICAL DATA:  Right thigh and knee pain 1 week.  No injury. EXAM: RIGHT KNEE - COMPLETE 4+ VIEW COMPARISON:  None. FINDINGS: Very subtle early degenerative changes are present. There is no fracture or dislocation. No significant joint effusion. IMPRESSION: No acute findings. Electronically Signed   By: Elberta Fortis M.D.   On: 04/15/2017 08:57   Dg Hip Unilat W Or Wo Pelvis 2-3 Views Right  Result Date: 04/15/2017 CLINICAL DATA:  Right thigh pain and knee pain 1 week.  No injury. EXAM: DG HIP (WITH OR WITHOUT PELVIS) 2-3V RIGHT COMPARISON:  None. FINDINGS: Mild symmetric degenerative change of the hips. No acute fracture or dislocation. Degenerative change of the spine. IMPRESSION: No acute findings. Electronically Signed   By: Elberta Fortis M.D.   On: 04/15/2017 08:56    Procedures Procedures (including critical care time)  Medications Ordered in ED Medications - No data to  display   Initial Impression / Assessment and Plan / ED Course  I have reviewed the triage vital signs and the nursing notes.  Pertinent labs & imaging results that were available during my care of the patient were reviewed by me and considered in my medical decision making (see chart for details).     77 year old female resents today with right hip and knee pain.  There is tenderness palpation present worse over the right hip.  However, she has full active range of motion and is bearing weight  without difficulty.  X-rays obtained showed no evidence of acute fracture or dislocation.  Patient received some pain relief with 1 Percocet here in the ED.  She has also been out of her blood pressure medicine and received 1 dose of her blood pressure medicine.  They are having it refilled and will be able to pick this up today.  Here she appears stable, however, pain could be radicular in nature.  Plan skeletal muscle relaxant.  Patient advised regarding need for close follow-up including return precautions of any loss of strength, change in bowel or bladder  Incontinence.  Patient and her husband voiced understanding of plan and will follow up with primary care physician.  Final Clinical Impressions(s) / ED Diagnoses   Final diagnoses:  Right leg pain  Hypertension, unspecified type  Has run out of medications    ED Discharge Orders    None       Margarita Grizzle, MD 04/15/17 1006

## 2017-04-17 ENCOUNTER — Ambulatory Visit: Payer: Medicare Other | Admitting: Adult Health

## 2017-04-17 ENCOUNTER — Encounter: Payer: Self-pay | Admitting: Adult Health

## 2017-04-17 ENCOUNTER — Ambulatory Visit (INDEPENDENT_AMBULATORY_CARE_PROVIDER_SITE_OTHER): Payer: Medicare Other | Admitting: Adult Health

## 2017-04-17 ENCOUNTER — Telehealth: Payer: Self-pay | Admitting: Adult Health

## 2017-04-17 VITALS — BP 214/92 | Temp 98.2°F | Wt 118.0 lb

## 2017-04-17 DIAGNOSIS — M25561 Pain in right knee: Secondary | ICD-10-CM

## 2017-04-17 DIAGNOSIS — M79604 Pain in right leg: Secondary | ICD-10-CM | POA: Diagnosis not present

## 2017-04-17 MED ORDER — OXYCODONE-ACETAMINOPHEN 10-325 MG PO TABS: ORAL_TABLET | ORAL | 0 refills | Status: DC

## 2017-04-17 NOTE — Telephone Encounter (Signed)
Copied from CRM #[id number]. Topic: Inquiry >> Apr 17, 2017  5:19 PM Raquel SarnaHayes, Teresa G wrote: [facility] [phone] Has a question about the directions for pt to take Rx below.  Please call asap.  Percocet 10/325

## 2017-04-17 NOTE — Progress Notes (Addendum)
Subjective:    Patient ID: Cassidy Bennett, female    DOB: 1940/10/27, 77 y.o.   MRN: [id number]  HPI  77 year old female who  has a past medical history of Allergy, History of TB (tuberculosis), Hypercholesteremia, Hypertension, Osteoarthritis, and Post-menopausal.  She presents to the office today for follow up after being seen in the ER two days ago for right leg pain ( which she saw me for two days prior). X rays of right knee and hip were negative. She was prescribed flexeril which she has been using to help relieve some pain. She has also been using Percocet that she was prescribed in 2014, this produces minimal relief but it is enough that she can fall asleep at night.   Reports that she feels as though the pain is getting worse, especially in the knee. Pain worse with ambulation and laying down.   Denies any bruising, redness or warmth   Review of Systems   See HPI   Past Medical History:  Diagnosis Date  . Allergy   . History of TB (tuberculosis)   . Hypercholesteremia   . Hypertension   . Osteoarthritis   . Post-menopausal     Social History   Socioeconomic History  . Marital status: Married    Spouse name: Not on file  . Number of children: Not on file  . Years of education: Not on file  . Highest education level: Not on file  Social Needs  . Financial resource strain: Not on file  . Food insecurity - worry: Not on file  . Food insecurity - inability: Not on file  . Transportation needs - medical: Not on file  . Transportation needs - non-medical: Not on file  Occupational History  . Not on file  Tobacco Use  . Smoking status: Never Smoker  . Smokeless tobacco: Never Used  Substance and Sexual Activity  . Alcohol use: No  . Drug use: No  . Sexual activity: Not on file  Other Topics Concern  . Not on file  Social History Narrative  . Not on file    Past Surgical History:  Procedure Laterality Date  . CATARACT EXTRACTION  2018  . DILATION  AND CURETTAGE OF UTERUS    . KNEE ARTHROSCOPY  2014   left  . LUMBAR DISC SURGERY    . SHOULDER SURGERY     right    Family History  Problem Relation Age of Onset  . Other Father        malaria  . Cancer Other        stomach    No Known Allergies  Current Outpatient Medications on File Prior to Visit  Medication Sig Dispense Refill  . Ascorbic Acid (VITAMIN C PO) Take by mouth.    Marland Kitchen. aspirin 81 MG chewable tablet Chew by mouth daily.    Marland Kitchen. atenolol (TENORMIN) 25 MG tablet Take 1 tablet (25 mg total) by mouth daily. Half tab daily 90 tablet 1  . CALCIUM PO Take 1 tablet by mouth daily.    . Cholecalciferol (VITAMIN D PO) Take by mouth.    . cyclobenzaprine (FLEXERIL) 10 MG tablet Take 0.5 tablets (5 mg total) by mouth 2 (two) times daily as needed for muscle spasms. 20 tablet 0  . loratadine (CLARITIN) 10 MG tablet Take 10 mg by mouth daily.    . magnesium 30 MG tablet Take 30 mg by mouth 2 (two) times daily.    . Multiple Vitamins-Calcium (  ONE-A-DAY WOMENS PO) Take 1 tablet by mouth daily.    . NON FORMULARY Vitamin B    . VITAMIN A PO Take by mouth.    Marland Kitchen VITAMIN E PO Take by mouth.     No current facility-administered medications on file prior to visit.     BP (!) 214/92 (BP Location: Right Arm)   Temp 98.2 F (36.8 C) (Oral)   Wt 118 lb (53.5 kg)   BMI 21.58 kg/m      Objective:   Physical Exam  Constitutional: She is oriented to person, place, and time. She appears well-developed and well-nourished. No distress.  Cardiovascular: Normal rate, regular rhythm, normal heart sounds and intact distal pulses. Exam reveals no gallop and no friction rub.  No murmur heard. Pulmonary/Chest: Effort normal and breath sounds normal. No respiratory distress. She has no wheezes. She has no rales. She exhibits no tenderness.  Musculoskeletal: She exhibits tenderness.       Right knee: She exhibits decreased range of motion, erythema and bony tenderness. She exhibits no swelling, no  effusion, no deformity and normal alignment. Tenderness found. LCL tenderness noted.       Legs: Neurological: She is alert and oriented to person, place, and time.  Skin: Skin is warm and dry. No rash noted. She is not diaphoretic. No erythema. No pallor.  Psychiatric: She has a normal mood and affect. Her behavior is normal. Judgment and thought content normal.  Nursing note and vitals reviewed.     Assessment & Plan:  Crepitus around knee seems to be worse today than it was late last week. Her pain is increasing. Will get MRI of femur and right knee. She can continue to take flexeril as needed. I will send in 3 day course of Percocet to take 0.5 to 1 pill every 8 hours PRN, do not take with flexeril  - Rest/Ice/Elevation  - Consider referral to orthopedics and or PT   Shirline Frees, NP

## 2017-04-18 NOTE — Telephone Encounter (Signed)
Cory, directions are not complete on prescription.  Please advise.

## 2017-04-18 NOTE — Telephone Encounter (Signed)
Spoke to [place] and informed her to have pt take medication every 8 hours PRN per Kandee Keenory.  No further action needed.  Will close note.

## 2017-04-18 NOTE — Telephone Encounter (Signed)
Every 8 hours PRN

## 2017-04-19 ENCOUNTER — Encounter (HOSPITAL_COMMUNITY): Payer: Self-pay

## 2017-04-19 ENCOUNTER — Emergency Department (HOSPITAL_BASED_OUTPATIENT_CLINIC_OR_DEPARTMENT_OTHER)
Admit: 2017-04-19 | Discharge: 2017-04-19 | Disposition: A | Discharge status: Home / Self Care | Payer: Medicare Other | Attending: Emergency Medicine | Admitting: Emergency Medicine

## 2017-04-19 ENCOUNTER — Emergency Department (HOSPITAL_COMMUNITY)
Admission: EM | Admit: 2017-04-19 | Discharge: 2017-04-19 | Disposition: A | Discharge status: Home / Self Care | Payer: Medicare Other | Attending: Emergency Medicine | Admitting: Emergency Medicine

## 2017-04-19 ENCOUNTER — Other Ambulatory Visit: Payer: Self-pay

## 2017-04-19 DIAGNOSIS — Z79899 Other long term (current) drug therapy: Secondary | ICD-10-CM | POA: Insufficient documentation

## 2017-04-19 DIAGNOSIS — M25561 Pain in right knee: Secondary | ICD-10-CM | POA: Diagnosis not present

## 2017-04-19 DIAGNOSIS — I10 Essential (primary) hypertension: Secondary | ICD-10-CM | POA: Diagnosis not present

## 2017-04-19 DIAGNOSIS — M79609 Pain in unspecified limb: Secondary | ICD-10-CM | POA: Diagnosis not present

## 2017-04-19 MED ORDER — OXYCODONE-ACETAMINOPHEN 5-325 MG PO TABS
1.0000 | ORAL_TABLET | ORAL | Status: DC | PRN
  Administered 2017-04-19: 1 via ORAL
  Filled 2017-04-19: qty 1

## 2017-04-19 NOTE — Progress Notes (Signed)
Orthopedic Tech Progress Note Patient Details:  Cassidy PeakChristine K Bennett February 20, 1941 [id number]  Ortho Devices Type of Ortho Device: Knee Immobilizer Ortho Device/Splint Location: RLE Ortho Device/Splint Interventions: Ordered, Application   Post Interventions Patient Tolerated: Well Instructions Provided: Care of device   Cassidy MoccasinHughes, Cassidy Bennett Cassidy Bennett 04/19/2017, 10:32 PM

## 2017-04-19 NOTE — ED Notes (Signed)
Pt came to front desk and asked about wait time. This RN reassessed pt and pt requested pain medicine.

## 2017-04-19 NOTE — ED Provider Notes (Signed)
Patient placed in Quick Look pathway, seen and evaluated   Chief Complaint: right thigh pain  HPI:   Patient reports that she has been having pain in the right thigh that started a week ago and has gotten worse. She was evaluated at [facility] ED 04/15/17 and treated with pain medication and muscle relaxer. Patient states that she was told that MRI and ultrasound were not available at the time she was there. Patient spoke with one of her family members that is a physician who told her she needed to be checked for DVT. Patient also saw her PCP who told her he was going to schedule an MRI of her knee. Patient is here today due to increased pain despite pain medication. Patient denies shortness of breath or other problems.   ROS: Bennett/S: right thigh pain  Physical Exam:  BP (!) 169/98 (BP Location: Left Arm)   Pulse 70   Temp 98.1 F (36.7 C) (Oral)   Resp 16   Ht 5\' 2"  (1.575 Bennett)   Wt 53.5 kg (118 lb)   SpO2 99%   BMI 21.58 kg/Bennett     Gen: No distress  Neuro: Awake and Alert  Skin: Warm and dry  Lungs: clear  Heart regular rate and rhythm  Bennett/S: tender with palpation of right thigh.  Full passive range of motion of the knee  with minimal pain. Pedal pulse 2+.     Focused Exam:   Initiation of care has begun. The patient has been counseled on the process, plan, and necessity for staying for the completion/evaluation, and the remainder of the medical screening examination    Cassidy Bennett, Cassidy M, NP 04/19/17 1550    Cassidy Bennett, [name], MD 04/20/17 1045

## 2017-04-19 NOTE — ED Provider Notes (Signed)
[facility] EMERGENCY DEPARTMENT Provider Note   CSN: [id number] Arrival date & time: 04/19/17  1512     History   Chief Complaint Chief Complaint  Patient presents with  . Leg Pain    HPI Cassidy Bennett is a 77 y.o. female.  Patient presents to the ED with a chief complaint of right knee pain.  She denies any injuries.  She states that she has been having for the past few weeks.  She states that the pain is beneath her knee cap and radiates to her mid thigh.  She states that it feels like her muscles have been cramping.  She has taking oxycodone and home with good relief.  She states that she saw her doctor and was advised to get and MRI.  She states that her doctor was going to order this for her.   The history is provided by the patient. No language interpreter was used.    Past Medical History:  Diagnosis Date  . Allergy   . History of TB (tuberculosis)   . Hypercholesteremia   . Hypertension   . Osteoarthritis   . Post-menopausal     Patient Active Problem List   Diagnosis Date Noted  . Closed displaced fracture of proximal phalanx of right great toe 01/03/2017  . Rotator cuff syndrome of right shoulder 01/03/2017  . Hyperlipidemia 09/05/2016  . Essential hypertension 08/27/2015  . Urine abnormality 03/29/2015  . Routine general medical examination at a health care facility 06/26/2013  . History of TB (tuberculosis) 06/17/2012  . ALLERGIC RHINITIS 01/22/2008  . Osteoporosis 12/13/2006  . OSTEOARTHRITIS 10/11/2006    Past Surgical History:  Procedure Laterality Date  . CATARACT EXTRACTION  2018  . DILATION AND CURETTAGE OF UTERUS    . KNEE ARTHROSCOPY  2014   left  . LUMBAR DISC SURGERY    . SHOULDER SURGERY     right    OB History    No data available       Home Medications    Prior to Admission medications   Medication Sig Start Date End Date Taking? Authorizing Provider  Ascorbic Acid (VITAMIN C PO) Take by mouth.     [provider]  aspirin 81 MG chewable tablet Chew by mouth daily.    [provider]  atenolol (TENORMIN) 25 MG tablet Take 1 tablet (25 mg total) by mouth daily. Half tab daily 01/26/17   Nafziger, Kandee Keen, NP  CALCIUM PO Take 1 tablet by mouth daily.    [provider]  Cholecalciferol (VITAMIN D PO) Take by mouth.    [provider]  cyclobenzaprine (FLEXERIL) 10 MG tablet Take 0.5 tablets (5 mg total) by mouth 2 (two) times daily as needed for muscle spasms. 04/15/17   Margarita Grizzle, MD  loratadine (CLARITIN) 10 MG tablet Take 10 mg by mouth daily.    [provider]  magnesium 30 MG tablet Take 30 mg by mouth 2 (two) times daily.    [provider]  Multiple Vitamins-Calcium (ONE-A-DAY WOMENS PO) Take 1 tablet by mouth daily.    [provider]  NON FORMULARY Vitamin B    [provider]  oxyCODONE-acetaminophen (PERCOCET) 10-325 MG tablet 0.5 - 1 tab 04/17/17   Shirline Frees, NP  VITAMIN A PO Take by mouth.    [provider]  VITAMIN E PO Take by mouth.    [provider]    Family History Family History  Problem Relation Age  of Onset  . Other Father        malaria  . Cancer Other        stomach    Social History Social History   Tobacco Use  . Smoking status: Never Smoker  . Smokeless tobacco: Never Used  Substance Use Topics  . Alcohol use: No  . Drug use: No     Allergies   Patient has no known allergies.   Review of Systems Review of Systems  All other systems reviewed and are negative.    Physical Exam Updated Vital Signs BP (!) 203/82   Pulse 65   Temp 98.1 F (36.7 C) (Oral)   Resp 15   Ht 5\' 2"  (1.575 m)   Wt 53.5 kg (118 lb)   SpO2 99%   BMI 21.58 kg/m   Physical Exam Nursing note and vitals reviewed.  Constitutional: Pt appears well-developed and well-nourished. No distress.  HENT:  Head: Normocephalic and atraumatic.  Eyes: Conjunctivae are normal.   Neck: Normal range of motion.  Cardiovascular: Normal rate, regular rhythm. Intact distal pulses.   Capillary refill < 3 sec.  Pulmonary/Chest: Effort normal and breath sounds normal.  Musculoskeletal:  Right lower extremity Pt exhibits no bony abnormality or deformity.  No calf tenderness, no evidence of DVT, abscess, cellulitis, or effusion ROM: 5/5  Strength: 5/5  Neurological: Pt  is alert. Coordination normal.  Sensation: 5/5 Skin: Skin is warm and dry. Pt is not diaphoretic.  No evidence of open wound or skin tenting Psychiatric: Pt has a normal mood and affect.     ED Treatments / Results  Labs (all labs ordered are listed, but only abnormal results are displayed) Labs Reviewed - No data to display  EKG  EKG Interpretation None       Radiology No results found.  Procedures Procedures (including critical care time)  Medications Ordered in ED Medications  oxyCODONE-acetaminophen (PERCOCET/ROXICET) 5-325 MG per tablet 1 tablet (1 tablet Oral Given 04/19/17 1958)     Initial Impression / Assessment and Plan / ED Course  I have reviewed the triage vital signs and the nursing notes.  Pertinent labs & imaging results that were available during my care of the patient were reviewed by me and considered in my medical decision making (see chart for details).     Patient with right knee pain.  Pain radiates from the knee cap to the mid thigh.  Could be patellofemoral syndrome.  No effusion or bony abnormality on recent x-ray, which I reviewed.  DVT study from today is negative.  She has no evidence of septic joint, cellulitis, or abscess.    Will give knee brace and recommend ortho follow-up.  Final Clinical Impressions(s) / ED Diagnoses   Final diagnoses:  Right knee pain, unspecified chronicity    ED Discharge Orders    None       Roxy HorsemanBrowning, Axle Parfait, Cordelia Poche-C 04/19/17 2216    Raeford RazorKohut, Stephen, MD 04/19/17 2328

## 2017-04-19 NOTE — ED Triage Notes (Signed)
Pt endorses right leg pain from the knee to the right hip. Pt went to [facility] Tuesday and was told that she needed and US and possibly and MRI but they did not have either. Pt's pcp is supposed to order MRI for pt but pt state that he has not called her back and she could not wait longer. VSS.

## 2017-04-19 NOTE — Progress Notes (Addendum)
Right lower extremity venous duplex has been completed. Negative for DVT. Results were given to Demetrios LollKenneth Leaphart PA.   04/19/17 5:08 PM Cassidy Bennett RVT

## 2017-04-20 ENCOUNTER — Inpatient Hospital Stay: Payer: Medicare Other | Admitting: Adult Health

## 2017-04-22 ENCOUNTER — Ambulatory Visit
Admission: RE | Admit: 2017-04-22 | Discharge: 2017-04-22 | Disposition: A | Discharge status: Home / Self Care | Payer: Medicare Other | Source: Ambulatory Visit | Attending: Adult Health | Admitting: Adult Health

## 2017-04-22 DIAGNOSIS — M25561 Pain in right knee: Secondary | ICD-10-CM | POA: Diagnosis not present

## 2017-04-22 DIAGNOSIS — M79651 Pain in right thigh: Secondary | ICD-10-CM | POA: Diagnosis not present

## 2017-04-22 DIAGNOSIS — M79604 Pain in right leg: Secondary | ICD-10-CM

## 2017-04-24 ENCOUNTER — Telehealth: Payer: Self-pay | Admitting: Adult Health

## 2017-04-24 ENCOUNTER — Other Ambulatory Visit: Payer: Self-pay | Admitting: Adult Health

## 2017-04-24 DIAGNOSIS — T148XXA Other injury of unspecified body region, initial encounter: Secondary | ICD-10-CM

## 2017-04-24 MED ORDER — NAPROXEN 500 MG PO TABS: 500.0000 mg | ORAL_TABLET | Freq: Two times a day (BID) | ORAL | 1 refills | Status: DC

## 2017-04-24 NOTE — Telephone Encounter (Signed)
MRI showed nothing wrong with the knee except for some arthritis.   MRI of the femur showed a strain or possible partial thickness tear of the large muscle in the thigh. There is nothing to do for this except physical therapy This will heal with time

## 2017-04-24 NOTE — Telephone Encounter (Signed)
I have sent in Naprosyn for pain. Do not take any more motrin/advil/aleve with this medication   PT has been ordered

## 2017-04-24 NOTE — Telephone Encounter (Signed)
Copied from CRM [id number]. Topic: Quick Communication - Other Results >> Apr 24, 2017  8:54 AM Louie BunPalacios Medina, Rosey Batheresa D wrote: [facility] about patients MRI that she had done. Please call patient back, thanks.

## 2017-04-24 NOTE — Telephone Encounter (Signed)
Pt notified of results.  She has agreed to PT.  Would like to know if Kandee KeenCory can write a prescription for pain.  She is taking 400 mg of ibuprofen every 4 hours and still in pain.  Please advise.

## 2017-04-24 NOTE — Telephone Encounter (Signed)
Pt notified to pick up prescription.  Instructed to NOT take any more Motrin/Advil/Aleve with prescribed medication.  Pt also notified that PT has been ordered.

## 2017-04-25 ENCOUNTER — Ambulatory Visit: Payer: Medicare Other | Admitting: Physical Therapy

## 2017-04-25 DIAGNOSIS — M5136 Other intervertebral disc degeneration, lumbar region: Secondary | ICD-10-CM | POA: Diagnosis not present

## 2017-04-27 ENCOUNTER — Ambulatory Visit: Payer: Medicare Other | Admitting: Physical Therapy

## 2017-05-09 DIAGNOSIS — M222X1 Patellofemoral disorders, right knee: Secondary | ICD-10-CM | POA: Diagnosis not present

## 2017-05-09 DIAGNOSIS — M5416 Radiculopathy, lumbar region: Secondary | ICD-10-CM | POA: Diagnosis not present

## 2017-05-09 DIAGNOSIS — M6281 Muscle weakness (generalized): Secondary | ICD-10-CM | POA: Diagnosis not present

## 2017-05-09 DIAGNOSIS — M48061 Spinal stenosis, lumbar region without neurogenic claudication: Secondary | ICD-10-CM | POA: Diagnosis not present

## 2017-05-28 DIAGNOSIS — M5416 Radiculopathy, lumbar region: Secondary | ICD-10-CM | POA: Diagnosis not present

## 2017-06-01 DIAGNOSIS — M5416 Radiculopathy, lumbar region: Secondary | ICD-10-CM | POA: Diagnosis not present

## 2017-06-04 DIAGNOSIS — M5416 Radiculopathy, lumbar region: Secondary | ICD-10-CM | POA: Diagnosis not present

## 2017-06-08 DIAGNOSIS — M5416 Radiculopathy, lumbar region: Secondary | ICD-10-CM | POA: Diagnosis not present

## 2017-06-11 DIAGNOSIS — M5416 Radiculopathy, lumbar region: Secondary | ICD-10-CM | POA: Diagnosis not present

## 2017-06-20 DIAGNOSIS — M5416 Radiculopathy, lumbar region: Secondary | ICD-10-CM | POA: Diagnosis not present

## 2017-06-27 DIAGNOSIS — M5416 Radiculopathy, lumbar region: Secondary | ICD-10-CM | POA: Diagnosis not present

## 2017-06-29 DIAGNOSIS — M5416 Radiculopathy, lumbar region: Secondary | ICD-10-CM | POA: Diagnosis not present

## 2017-07-04 DIAGNOSIS — M5416 Radiculopathy, lumbar region: Secondary | ICD-10-CM | POA: Diagnosis not present

## 2017-07-13 DIAGNOSIS — M5416 Radiculopathy, lumbar region: Secondary | ICD-10-CM | POA: Diagnosis not present

## 2017-07-13 DIAGNOSIS — M1712 Unilateral primary osteoarthritis, left knee: Secondary | ICD-10-CM | POA: Diagnosis not present

## 2017-07-13 DIAGNOSIS — M1711 Unilateral primary osteoarthritis, right knee: Secondary | ICD-10-CM | POA: Diagnosis not present

## 2017-07-13 DIAGNOSIS — M25562 Pain in left knee: Secondary | ICD-10-CM | POA: Diagnosis not present

## 2017-07-27 DIAGNOSIS — M5137 Other intervertebral disc degeneration, lumbosacral region: Secondary | ICD-10-CM | POA: Diagnosis not present

## 2017-08-18 DIAGNOSIS — M5416 Radiculopathy, lumbar region: Secondary | ICD-10-CM | POA: Diagnosis not present

## 2017-08-25 DIAGNOSIS — M5416 Radiculopathy, lumbar region: Secondary | ICD-10-CM | POA: Diagnosis not present

## 2017-09-07 ENCOUNTER — Encounter: Payer: Self-pay | Admitting: Adult Health

## 2017-09-07 ENCOUNTER — Ambulatory Visit (INDEPENDENT_AMBULATORY_CARE_PROVIDER_SITE_OTHER): Payer: Medicare Other | Admitting: Adult Health

## 2017-09-07 VITALS — BP 130/86 | Temp 98.1°F | Ht 62.0 in | Wt 116.0 lb

## 2017-09-07 DIAGNOSIS — E559 Vitamin D deficiency, unspecified: Secondary | ICD-10-CM | POA: Diagnosis not present

## 2017-09-07 DIAGNOSIS — E782 Mixed hyperlipidemia: Secondary | ICD-10-CM

## 2017-09-07 DIAGNOSIS — M810 Age-related osteoporosis without current pathological fracture: Secondary | ICD-10-CM

## 2017-09-07 DIAGNOSIS — M79651 Pain in right thigh: Secondary | ICD-10-CM | POA: Diagnosis not present

## 2017-09-07 DIAGNOSIS — I10 Essential (primary) hypertension: Secondary | ICD-10-CM

## 2017-09-07 LAB — LIPID PANEL
CHOLESTEROL: 195 mg/dL (ref 0–200)
HDL: 53 mg/dL (ref >39.00)
NonHDL: 142.12
Total CHOL/HDL Ratio: 4
Triglycerides: 281 mg/dL — ABNORMAL HIGH (ref 0.0–149.0)
VLDL: 56.2 mg/dL — ABNORMAL HIGH (ref 0.0–40.0)

## 2017-09-07 LAB — CBC WITH DIFFERENTIAL/PLATELET
BASOS PCT: 0.6 % (ref 0.0–3.0)
Basophils Absolute: 0 10*3/uL (ref 0.0–0.1)
Eosinophils Absolute: 0.1 10*3/uL (ref 0.0–0.7)
Eosinophils Relative: 2.1 % (ref 0.0–5.0)
HCT: 39.1 % (ref 36.0–46.0)
Hemoglobin: 13.1 g/dL (ref 12.0–15.0)
LYMPHS ABS: 2.2 10*3/uL (ref 0.7–4.0)
Lymphocytes Relative: 39.7 % (ref 12.0–46.0)
MCHC: 33.5 g/dL (ref 30.0–36.0)
MCV: 82.7 fl (ref 78.0–100.0)
MONO ABS: 0.4 10*3/uL (ref 0.1–1.0)
Monocytes Relative: 6.6 % (ref 3.0–12.0)
NEUTROS ABS: 2.8 10*3/uL (ref 1.4–7.7)
Neutrophils Relative %: 51 % (ref 43.0–77.0)
Platelets: 179 10*3/uL (ref 150.0–400.0)
RBC: 4.72 Mil/uL (ref 3.87–5.11)
RDW: 15.1 % (ref 11.5–15.5)
WBC: 5.5 10*3/uL (ref 4.0–10.5)

## 2017-09-07 LAB — BASIC METABOLIC PANEL
BUN: 15 mg/dL (ref 6–23)
CO2: 29 meq/L (ref 19–32)
CREATININE: 0.6 mg/dL (ref 0.40–1.20)
Calcium: 9.4 mg/dL (ref 8.4–10.5)
Chloride: 105 mEq/L (ref 96–112)
GFR: 102.85 mL/min (ref >60.00)
GLUCOSE: 106 mg/dL — AB (ref 70–99)
Potassium: 4.4 mEq/L (ref 3.5–5.1)
Sodium: 141 mEq/L (ref 135–145)

## 2017-09-07 LAB — HEPATIC FUNCTION PANEL
ALT: 21 U/L (ref 0–35)
AST: 22 U/L (ref 0–37)
Albumin: 4.6 g/dL (ref 3.5–5.2)
Alkaline Phosphatase: 45 U/L (ref 39–117)
Bilirubin, Direct: 0.1 mg/dL (ref 0.0–0.3)
Total Bilirubin: 0.7 mg/dL (ref 0.2–1.2)
Total Protein: 7.6 g/dL (ref 6.0–8.3)

## 2017-09-07 LAB — TSH: TSH: 3.77 u[IU]/mL (ref 0.35–4.50)

## 2017-09-07 LAB — LDL CHOLESTEROL, DIRECT: Direct LDL: 85 mg/dL

## 2017-09-07 LAB — VITAMIN D 25 HYDROXY (VIT D DEFICIENCY, FRACTURES): VITD: 30.24 ng/mL (ref 30.00–100.00)

## 2017-09-07 MED ORDER — ATENOLOL 25 MG PO TABS: 25.0000 mg | ORAL_TABLET | Freq: Every day | ORAL | 1 refills | Status: DC

## 2017-09-07 NOTE — Progress Notes (Signed)
Subjective:    Patient ID: Cassidy Bennett, female    DOB: December 07, 1940, 77 y.o.   MRN: [id number]  HPI  Patient presents for yearly preventative medicine examination. She is a pleasant 77 year old female who  has a past medical history of Allergy, History of TB (tuberculosis), Hypercholesteremia, Hypertension, Osteoarthritis, and Post-menopausal.  Essential Hypertension - Takes Atenolol 12.5 mg daily  BP Readings from Last 3 Encounters:  09/07/17 130/86  04/19/17 128/82  04/17/17 (!) 214/92   Osteoporosis - takes Vitamin D and Calcium supplement daily.   Hyperlipidemia - does not take medication at this time.  Lab Results  Component Value Date   CHOL 196 09/05/2016   HDL 54.10 09/05/2016   LDLCALC 110 (H) 09/05/2016   LDLDIRECT 75.0 08/27/2015   TRIG 158.0 (H) 09/05/2016   CHOLHDL 4 09/05/2016    Lumbar Radiculopathy - She has been being seen by Dr. Ethelene Hal. Recently had a epidural injection. Reports that it worked somewhat but she is not pain free. She is being seen for follow up by him today   All immunizations and health maintenance protocols were reviewed with the patient and needed orders were placed.  Appropriate screening laboratory values were ordered for the patient including screening of hyperlipidemia, renal function and hepatic function.  Medication reconciliation,  past medical history, social history, problem list and allergies were reviewed in detail with the patient  Goals were established with regard to weight loss, exercise, and  diet in compliance with medications  End of life planning was discussed. She has an advanced directive and living will.   She no longer needs to have a colonoscopy. Her last mammogram was in 2018 and last bone density screen in 2017 ( osteoporosis)  Review of Systems  Constitutional: Negative.   HENT: Negative.   Eyes: Negative.   Respiratory: Negative.   Cardiovascular: Negative.   Gastrointestinal: Negative.     Endocrine: Negative.   Genitourinary: Negative.   Musculoskeletal: Positive for arthralgias and back pain.  Skin: Negative.   Allergic/Immunologic: Negative.   Neurological: Negative.   Hematological: Negative.   Psychiatric/Behavioral: Negative.    Past Medical History:  Diagnosis Date  . Allergy   . History of TB (tuberculosis)   . Hypercholesteremia   . Hypertension   . Osteoarthritis   . Post-menopausal     Social History   Socioeconomic History  . Marital status: Married    Spouse name: Not on file  . Number of children: Not on file  . Years of education: Not on file  . Highest education level: Not on file  Occupational History  . Not on file  Social Needs  . Financial resource strain: Not on file  . Food insecurity:    Worry: Not on file    Inability: Not on file  . Transportation needs:    Medical: Not on file    Non-medical: Not on file  Tobacco Use  . Smoking status: Never Smoker  . Smokeless tobacco: Never Used  Substance and Sexual Activity  . Alcohol use: No  . Drug use: No  . Sexual activity: Not on file  Lifestyle  . Physical activity:    Days per week: Not on file    Minutes per session: Not on file  . Stress: Not on file  Relationships  . Social connections:    Talks on phone: Not on file    Gets together: Not on file    Attends religious service: Not on file  Active member of club or organization: Not on file    Attends meetings of clubs or organizations: Not on file    Relationship status: Not on file  . Intimate partner violence:    Fear of current or ex partner: Not on file    Emotionally abused: Not on file    Physically abused: Not on file    Forced sexual activity: Not on file  Other Topics Concern  . Not on file  Social History Narrative  . Not on file    Past Surgical History:  Procedure Laterality Date  . CATARACT EXTRACTION  2018  . DILATION AND CURETTAGE OF UTERUS    . KNEE ARTHROSCOPY  2014   left  . LUMBAR  DISC SURGERY    . SHOULDER SURGERY     right    Family History  Problem Relation Age of Onset  . Other Father        malaria  . Cancer Other        stomach    No Known Allergies  Current Outpatient Medications on File Prior to Visit  Medication Sig Dispense Refill  . Ascorbic Acid (VITAMIN C PO) Take by mouth.    Marland Kitchen. aspirin 81 MG chewable tablet Chew by mouth daily.    Marland Kitchen. CALCIUM PO Take 1 tablet by mouth daily.    . Cholecalciferol (VITAMIN D PO) Take by mouth.    . loratadine (CLARITIN) 10 MG tablet Take 10 mg by mouth daily.    . magnesium 30 MG tablet Take 30 mg by mouth 2 (two) times daily.    . Multiple Vitamins-Calcium (ONE-A-DAY WOMENS PO) Take 1 tablet by mouth daily.    . NON FORMULARY Vitamin B    . VITAMIN A PO Take by mouth.    Marland Kitchen. VITAMIN E PO Take by mouth.     No current facility-administered medications on file prior to visit.     BP 130/86   Temp 98.1 F (36.7 C) (Oral)   Ht 5\' 2"  (1.575 m)   Wt 116 lb (52.6 kg)   BMI 21.22 kg/m       Objective:   Physical Exam  Constitutional: She is oriented to person, place, and time. She appears well-developed and well-nourished. No distress.  HENT:  Head: Normocephalic and atraumatic.  Right Ear: External ear normal.  Left Ear: External ear normal.  Nose: Nose normal.  Mouth/Throat: Oropharynx is clear and moist. No oropharyngeal exudate.  Eyes: Pupils are equal, round, and reactive to light. Conjunctivae and EOM are normal. Right eye exhibits no discharge. Left eye exhibits no discharge. No scleral icterus.  Neck: Normal range of motion. Neck supple. No JVD present. No tracheal deviation present. No thyromegaly present.  Cardiovascular: Normal rate, regular rhythm, normal heart sounds and intact distal pulses. Exam reveals no gallop and no friction rub.  No murmur heard. Pulmonary/Chest: Effort normal and breath sounds normal. No stridor. No respiratory distress. She has no wheezes. She has no rales. She  exhibits no tenderness.  Abdominal: Soft. Bowel sounds are normal. She exhibits no distension and no mass. There is no tenderness. There is no rebound and no guarding. No hernia.  Musculoskeletal: Normal range of motion. She exhibits no edema, tenderness or deformity.  Lymphadenopathy:    She has no cervical adenopathy.  Neurological: She is alert and oriented to person, place, and time. She displays normal reflexes. No cranial nerve deficit or sensory deficit. She exhibits normal muscle tone. Coordination normal.  Skin: Skin is warm and dry. Capillary refill takes less than 2 seconds. No rash noted. She is not diaphoretic. No erythema. No pallor.  Psychiatric: She has a normal mood and affect. Her behavior is normal. Judgment and thought content normal.  Nursing note and vitals reviewed.     Assessment & Plan:  1. Essential hypertension - Near goal. Will continue to monitor, no change in medication at this time  - Basic metabolic panel - CBC with Differential/Platelet - Hepatic function panel - Lipid panel - TSH - atenolol (TENORMIN) 25 MG tablet; Take 1 tablet (25 mg total) by mouth daily. Half tab daily  Dispense: 90 tablet; Refill: 1  2. Mixed hyperlipidemia - Consider statin  - Basic metabolic panel - CBC with Differential/Platelet - Hepatic function panel - Lipid panel - TSH  3. Osteoporosis without current pathological fracture, unspecified osteoporosis type  - Vitamin D, 25-hydroxy  4. Vitamin D deficiency  - Vitamin D, 25-hydroxy  Shirline Frees, NP

## 2017-09-12 ENCOUNTER — Telehealth: Payer: Self-pay | Admitting: *Deleted

## 2017-09-12 NOTE — Telephone Encounter (Signed)
Called and scheduled patient for a Prolia injection.   The cost is 20% for Prolia and the admin fee. Patient has secondary insurance, so the cost should be $0.

## 2017-09-26 ENCOUNTER — Ambulatory Visit: Payer: Self-pay

## 2017-09-26 ENCOUNTER — Ambulatory Visit (INDEPENDENT_AMBULATORY_CARE_PROVIDER_SITE_OTHER): Payer: Medicare Other | Admitting: Family Medicine

## 2017-09-26 ENCOUNTER — Encounter: Payer: Self-pay | Admitting: Family Medicine

## 2017-09-26 VITALS — BP 142/70 | HR 85 | Temp 97.9°F | Wt 118.8 lb

## 2017-09-26 DIAGNOSIS — S61215A Laceration without foreign body of left ring finger without damage to nail, initial encounter: Secondary | ICD-10-CM | POA: Diagnosis not present

## 2017-09-26 NOTE — Telephone Encounter (Signed)
Pt. Reports she cutting squash last night and cut her left ring finger. States "it's not very big, but it was deep." "Still oozing a little." Concerned she may need a stitch in it and/or a tetanus shot. Appointment made for today.  Reason for Disposition . [1] Last tetanus shot > 5 years ago AND [2] DIRTY cut  Answer Assessment - Initial Assessment Questions 1. APPEARANCE of INJURY: "What does the injury look like?"      Cut 2. SIZE: "How large is the cut?"      Not very big but deep 3. BLEEDING: "Is it bleeding now?" If so, ask: "Is it difficult to stop?"      Bleeding - stopped 4. LOCATION: "Where is the injury located?"      Left ring finger 5. ONSET: "How long ago did the injury occur?"      Last night - cutting squash 6. MECHANISM: "Tell me how it happened."      Cutting squash 7. TETANUS: "When was the last tetanus booster?"     Unsure 8. PREGNANCY: "Is there any chance you are pregnant?" "When was your last menstrual period?"     No  Protocols used: CUTS AND LACERATIONS-A-AH

## 2017-09-26 NOTE — Patient Instructions (Signed)
Take off dressing tomorrow to make sure there is no bleeding or skin abnormality.   Let me know if any redness, discoloration, drainage or concerns in the meanwhile.

## 2017-09-26 NOTE — Progress Notes (Signed)
ODESSIA ASLESON DOB: June 13, 1940 Encounter date: 09/26/2017  This is a 77 y.o. female who presents with Chief Complaint  Patient presents with  . cut finger    ring finger, left hand, last night cutting veggies, used neosporin and bandage, cleaned with hydrogen peroxide    History of present illness:  Cut finger last night when cutting a squash from garden. Bleeding controlled with tight wrapping, but worries when it doesn't have pressure that bleeding continues. Having throbbing pain in finger; better with tylenol (just tried this prior to office visit). Fourth finger left hand.     No Known Allergies Current Meds  Medication Sig  . Ascorbic Acid (VITAMIN C PO) Take by mouth.  Marland Kitchen aspirin 81 MG chewable tablet Chew by mouth daily.  Marland Kitchen atenolol (TENORMIN) 25 MG tablet Take 1 tablet (25 mg total) by mouth daily. Half tab daily  . CALCIUM PO Take 1 tablet by mouth daily.  . Cholecalciferol (VITAMIN D PO) Take by mouth.  . loratadine (CLARITIN) 10 MG tablet Take 10 mg by mouth daily.  . magnesium 30 MG tablet Take 30 mg by mouth 2 (two) times daily.  . Multiple Vitamins-Calcium (ONE-A-DAY WOMENS PO) Take 1 tablet by mouth daily.  . NON FORMULARY Vitamin B  . VITAMIN A PO Take by mouth.  Marland Kitchen VITAMIN E PO Take by mouth.    Review of Systems  Constitutional: Negative for chills and fever.  Skin: Positive for wound (see hpi; wound is bleeding excessively even after pressure is applied.).    Objective:  BP (!) 142/70 (BP Location: Right Arm, Patient Position: Sitting, Cuff Size: Normal)   Pulse 85   Temp 97.9 F (36.6 C) (Oral)   Wt 118 lb 12.8 oz (53.9 kg)   SpO2 99%   BMI 21.73 kg/m   Weight: 118 lb 12.8 oz (53.9 kg)   BP Readings from Last 3 Encounters:  09/26/17 (!) 142/70  09/07/17 130/86  04/19/17 128/82   Wt Readings from Last 3 Encounters:  09/26/17 118 lb 12.8 oz (53.9 kg)  09/07/17 116 lb (52.6 kg)  04/19/17 118 lb (53.5 kg)    Physical Exam   Constitutional: She appears well-developed and well-nourished.  Cardiovascular:  Pulses:      Radial pulses are 2+ on the left side.  Left hand/fingers are warm and well perfused.  Psychiatric: Her mood appears anxious.   Laceration Repair  Wound description 1 cm laceration fourth digit palmar aspect distal.  Laceration is into superficial muscle.  It is somewhat angulated. No bone or tendon involvement. Patient has full sensation in finger and full mobility.  After verbal consent obtained, laceration and surrounding skin were cleansed with betadine and skin and soft tissue surrounding laceration were injected with lidocaine without epinephrine. Then using 4-0 suture, area was closed with 3 stitches.   Patient tolerated procedure well and hemostasis was achieved in the office. After suturing it was apparent that near the apices of the laceration there was a subtle perpendicular superficial laceration as well. After pressure was applied, the bleeding stopped from this area as well. Skin was well approximated with sutures. Skin was cleansed and antibiotic ointment was applied to wound. Guaze pressure dressing applied to finger tip.  Assessment/Plan  1. Laceration of left ring finger, initial encounter Wound care reviewed.  Take off dressing tomorrow to make sure there is no bleeding or skin abnormality.   Let me know if any redness, discoloration, drainage or concerns in the meanwhile.  Return in about 1 week (around 10/03/2017) for suture removal.     Theodis ShoveJunell Koberlein, MD

## 2017-10-02 ENCOUNTER — Ambulatory Visit: Payer: Self-pay

## 2017-10-04 ENCOUNTER — Ambulatory Visit: Payer: Medicare Other

## 2017-10-04 ENCOUNTER — Telehealth: Payer: Self-pay | Admitting: Family Medicine

## 2017-10-04 ENCOUNTER — Encounter: Payer: Self-pay | Admitting: Adult Health

## 2017-10-04 ENCOUNTER — Ambulatory Visit (INDEPENDENT_AMBULATORY_CARE_PROVIDER_SITE_OTHER): Payer: Medicare Other | Admitting: Adult Health

## 2017-10-04 VITALS — BP 120/76 | Temp 98.2°F | Wt 117.0 lb

## 2017-10-04 DIAGNOSIS — Z4802 Encounter for removal of sutures: Secondary | ICD-10-CM | POA: Diagnosis not present

## 2017-10-04 NOTE — Telephone Encounter (Signed)
Can we order Prolia for patient, will need to call and schedule a nurse visit. Patient was here today for a stitch removal, was going to get the injection while here, however we did not have the medication in office.

## 2017-10-04 NOTE — Progress Notes (Signed)
Subjective:    Patient ID: Cassidy Bennett, female    DOB: 1940-03-02, 77 y.o.   MRN: [id number]  HPI 77 year old female who  has a past medical history of Allergy, History of TB (tuberculosis), Hypercholesteremia, Hypertension, Osteoarthritis, and Post-menopausal.  She presents to the office today for suture removal. She had a three sutures placed on the fourth finger of left hand  9 days ago, after she cut it on a knife wile cutting squash. Bleeding was not controlled at that time   She denies any pain, redness, warmth, numbness or loss of ROM   Review of Systems See HPI   Past Medical History:  Diagnosis Date  . Allergy   . History of TB (tuberculosis)   . Hypercholesteremia   . Hypertension   . Osteoarthritis   . Post-menopausal     Social History   Socioeconomic History  . Marital status: Married    Spouse name: Not on file  . Number of children: Not on file  . Years of education: Not on file  . Highest education level: Not on file  Occupational History  . Not on file  Social Needs  . Financial resource strain: Not on file  . Food insecurity:    Worry: Not on file    Inability: Not on file  . Transportation needs:    Medical: Not on file    Non-medical: Not on file  Tobacco Use  . Smoking status: Never Smoker  . Smokeless tobacco: Never Used  Substance and Sexual Activity  . Alcohol use: No  . Drug use: No  . Sexual activity: Not on file  Lifestyle  . Physical activity:    Days per week: Not on file    Minutes per session: Not on file  . Stress: Not on file  Relationships  . Social connections:    Talks on phone: Not on file    Gets together: Not on file    Attends religious service: Not on file    Active member of club or organization: Not on file    Attends meetings of clubs or organizations: Not on file    Relationship status: Not on file  . Intimate partner violence:    Fear of current or ex partner: Not on file    Emotionally abused:  Not on file    Physically abused: Not on file    Forced sexual activity: Not on file  Other Topics Concern  . Not on file  Social History Narrative  . Not on file    Past Surgical History:  Procedure Laterality Date  . CATARACT EXTRACTION  2018  . DILATION AND CURETTAGE OF UTERUS    . KNEE ARTHROSCOPY  2014   left  . LUMBAR DISC SURGERY    . SHOULDER SURGERY     right    Family History  Problem Relation Age of Onset  . Other Father        malaria  . Cancer Other        stomach    No Known Allergies  Current Outpatient Medications on File Prior to Visit  Medication Sig Dispense Refill  . Ascorbic Acid (VITAMIN C PO) Take by mouth.    Marland Kitchen. aspirin 81 MG chewable tablet Chew by mouth daily.    Marland Kitchen. atenolol (TENORMIN) 25 MG tablet Take 1 tablet (25 mg total) by mouth daily. Half tab daily 90 tablet 1  . CALCIUM PO Take 1 tablet by mouth daily.    .Marland Kitchen  Cholecalciferol (VITAMIN D PO) Take by mouth.    . loratadine (CLARITIN) 10 MG tablet Take 10 mg by mouth daily.    . magnesium 30 MG tablet Take 30 mg by mouth 2 (two) times daily.    . Multiple Vitamins-Calcium (ONE-A-DAY WOMENS PO) Take 1 tablet by mouth daily.    . NON FORMULARY Vitamin B    . VITAMIN A PO Take by mouth.    Marland Kitchen VITAMIN E PO Take by mouth.     No current facility-administered medications on file prior to visit.     BP 120/76   Temp 98.2 F (36.8 C) (Oral)   Wt 117 lb (53.1 kg)   BMI 21.40 kg/m       Objective:   Physical Exam  Constitutional: She is oriented to person, place, and time. She appears well-developed and well-nourished. No distress.  Musculoskeletal: Normal range of motion.  Neurological: She is alert and oriented to person, place, and time.  Skin: Skin is warm and dry. No rash noted. She is not diaphoretic. No erythema. No pallor.  Well healed wound on left fourth finger. No sings of infection noted   Nursing note and vitals reviewed.      Assessment & Plan:  1. Visit for suture  removal Three simples sutures removed easily. No signs of infection. Patient tolerated procedure well  - Follow up as needed  Shirline Frees, NP

## 2017-10-08 ENCOUNTER — Encounter (INDEPENDENT_AMBULATORY_CARE_PROVIDER_SITE_OTHER): Payer: Self-pay

## 2017-10-08 NOTE — Telephone Encounter (Signed)
Left message on machine for patient to call and schedule her Prolia injection.

## 2017-10-25 ENCOUNTER — Ambulatory Visit (INDEPENDENT_AMBULATORY_CARE_PROVIDER_SITE_OTHER): Payer: Medicare Other | Admitting: Family Medicine

## 2017-10-25 ENCOUNTER — Telehealth: Payer: Self-pay | Admitting: Family Medicine

## 2017-10-25 DIAGNOSIS — M810 Age-related osteoporosis without current pathological fracture: Secondary | ICD-10-CM

## 2017-10-25 MED ORDER — DENOSUMAB 60 MG/ML ~~LOC~~ SOSY
60.0000 mg | PREFILLED_SYRINGE | Freq: Once | SUBCUTANEOUS | Status: AC
  Administered 2017-10-25: 60 mg via SUBCUTANEOUS

## 2017-10-25 NOTE — Telephone Encounter (Signed)
Pt would like to know how long she will need to continue with Prolia injections?

## 2017-10-25 NOTE — Telephone Encounter (Signed)
Cassidy Bennett, advised pt that Prolia is a long term use medication and she should continue with injections every 6 months until advised to stop by you.  She asked about further testing for her bones.  I looked at her last bone density and believe she is due for another.  Ok to order?  Please advise.

## 2017-10-25 NOTE — Telephone Encounter (Signed)
Is recommended to get a bone density screen 1 year after starting treatment

## 2017-10-25 NOTE — Progress Notes (Signed)
Per orders of Cory Nafziger NP, injection of Prolia 60 mg given by NIMMONS, [name]. Patient tolerated injection well.  

## 2017-10-26 NOTE — Telephone Encounter (Signed)
Spoke to the pt and advised that she wait 1 year for bone density.  She has agreed.

## 2017-11-06 ENCOUNTER — Other Ambulatory Visit: Payer: Self-pay | Admitting: Adult Health

## 2017-11-06 DIAGNOSIS — Z1231 Encounter for screening mammogram for malignant neoplasm of breast: Secondary | ICD-10-CM

## 2017-11-08 DIAGNOSIS — H04123 Dry eye syndrome of bilateral lacrimal glands: Secondary | ICD-10-CM | POA: Diagnosis not present

## 2017-11-08 DIAGNOSIS — H16103 Unspecified superficial keratitis, bilateral: Secondary | ICD-10-CM | POA: Diagnosis not present

## 2017-11-08 DIAGNOSIS — H52203 Unspecified astigmatism, bilateral: Secondary | ICD-10-CM | POA: Diagnosis not present

## 2017-11-08 DIAGNOSIS — H25812 Combined forms of age-related cataract, left eye: Secondary | ICD-10-CM | POA: Diagnosis not present

## 2017-11-16 ENCOUNTER — Other Ambulatory Visit: Payer: Self-pay | Admitting: Adult Health

## 2017-11-16 DIAGNOSIS — I10 Essential (primary) hypertension: Secondary | ICD-10-CM

## 2017-11-16 MED ORDER — ATENOLOL 25 MG PO TABS: 25.0000 mg | ORAL_TABLET | Freq: Every day | ORAL | 1 refills | Status: DC

## 2017-12-07 ENCOUNTER — Ambulatory Visit: Payer: Medicare Other

## 2017-12-27 DIAGNOSIS — Z23 Encounter for immunization: Secondary | ICD-10-CM | POA: Diagnosis not present

## 2017-12-31 ENCOUNTER — Ambulatory Visit
Admission: RE | Admit: 2017-12-31 | Discharge: 2017-12-31 | Disposition: A | Discharge status: Home / Self Care | Payer: Medicare Other | Source: Ambulatory Visit | Attending: Adult Health | Admitting: Adult Health

## 2017-12-31 DIAGNOSIS — Z1231 Encounter for screening mammogram for malignant neoplasm of breast: Secondary | ICD-10-CM | POA: Diagnosis not present

## 2018-01-16 ENCOUNTER — Other Ambulatory Visit: Payer: Self-pay | Admitting: Adult Health

## 2018-03-05 DIAGNOSIS — M170 Bilateral primary osteoarthritis of knee: Secondary | ICD-10-CM | POA: Diagnosis not present

## 2018-03-05 DIAGNOSIS — H04122 Dry eye syndrome of left lacrimal gland: Secondary | ICD-10-CM | POA: Diagnosis not present

## 2018-03-05 DIAGNOSIS — M1712 Unilateral primary osteoarthritis, left knee: Secondary | ICD-10-CM | POA: Diagnosis not present

## 2018-03-13 ENCOUNTER — Telehealth: Payer: Self-pay

## 2018-03-13 ENCOUNTER — Ambulatory Visit (INDEPENDENT_AMBULATORY_CARE_PROVIDER_SITE_OTHER): Payer: Medicare Other

## 2018-03-13 VITALS — BP 166/80 | HR 73 | Temp 97.7°F | Resp 16 | Ht 62.0 in | Wt 118.0 lb

## 2018-03-13 DIAGNOSIS — Z1159 Encounter for screening for other viral diseases: Secondary | ICD-10-CM

## 2018-03-13 DIAGNOSIS — M810 Age-related osteoporosis without current pathological fracture: Secondary | ICD-10-CM

## 2018-03-13 DIAGNOSIS — Z0000 Encounter for general adult medical examination without abnormal findings: Secondary | ICD-10-CM

## 2018-03-13 NOTE — Progress Notes (Signed)
Medical Screening examination was performed by a non-physician practitioner and as supervising physician I was immediatly  Available for consultation/collaboration. I agree with above.   Keyari Kleeman, NP  

## 2018-03-13 NOTE — Progress Notes (Signed)
Subjective:   Cassidy Bennett is a 78 y.o. female who presents for Medicare Annual (Subsequent) preventive examination.  Review of Systems:  No ROS.  Medicare Wellness Visit. Additional risk factors are reflected in the social history.  Cardiac Risk Factors include: advanced age (>155men, 58>65 women) Sleep patterns: awakens early and feels rested on waking.    Home Safety/Smoke Alarms: Feels safe in home. Smoke alarms in place.  Living environment; residence and Firearm Safety: 2-story house, equipment: Medical laboratory scientific officerCane, Type: Single [employer] Cane and wheelchair. Does not use cane or wheelchair however, but has on hand. No additional DME required at this time. Seat Belt Safety/Bike Helmet: Wears seat belt.   Female:   Pap-Pt. Requesting; Wanting to know if Dr. Hassan RowanKoberlein suggest another at next OV 1/17.      Mammo- N/A as pt. Over 75yo Dexa scan- 2017; due 2019    CCS- 2015, WNL. Due 2025.      Objective:     Vitals: BP (!) 166/80 (BP Location: Right Arm, Patient Position: Sitting, Cuff Size: Normal) Comment: rechecked X2  Pulse 73   Temp 97.7 F (36.5 C) (Oral)   Resp 16   Ht 5\' 2"  (1.575 m)   Wt 118 lb (53.5 kg)   SpO2 98%   BMI 21.58 kg/m   Body mass index is 21.58 kg/m.  Advanced Directives 03/13/2018 04/19/2017 04/15/2017 08/27/2015 08/28/2013  Does Patient Have a Medical Advance Directive? Yes No No No Patient has advance directive, copy not in chart  Type of Advance Directive Healthcare Power of TammsAttorney;Living will - - - Living will;Healthcare Power of Attorney  Does patient want to make changes to medical advance directive? No - Patient declined - - - -  Copy of Healthcare Power of Attorney in Chart? No - copy requested - - - Copy requested from family  Would patient like information on creating a medical advance directive? - No - Patient declined - - -  Pre-existing out of facility DNR order (yellow form or pink MOST form) - - - - No    Tobacco Social History   Tobacco Use    Smoking Status Never Smoker  Smokeless Tobacco Never Used     Counseling given: Not Answered   Past Medical History:  Diagnosis Date  . Allergy   . History of TB (tuberculosis)   . Hypercholesteremia   . Hypertension   . Osteoarthritis   . Post-menopausal    Past Surgical History:  Procedure Laterality Date  . CATARACT EXTRACTION  2018  . DILATION AND CURETTAGE OF UTERUS    . KNEE ARTHROSCOPY  2014   left  . LUMBAR DISC SURGERY    . SHOULDER SURGERY     right   Family History  Problem Relation Age of Onset  . Other Father        malaria  . Cancer Other        stomach   Social History   Socioeconomic History  . Marital status: Married    Spouse name: Not on file  . Number of children: 3  . Years of education: Not on file  . Highest education level: Not on file  Occupational History  . Occupation: Retired Mining engineerschool principal  Social Needs  . Financial resource strain: Not hard at all  . Food insecurity:    Worry: Never true    Inability: Never true  . Transportation needs:    Medical: No    Non-medical: No  Tobacco Use  .  Smoking status: Never Smoker  . Smokeless tobacco: Never Used  Substance and Sexual Activity  . Alcohol use: Yes    Alcohol/week: 5.0 standard drinks    Types: 5 Glasses of wine per week  . Drug use: No  . Sexual activity: Not on file  Lifestyle  . Physical activity:    Days per week: 0 days    Minutes per session: 0 min  . Stress: Not at all  Relationships  . Social connections:    Talks on phone: Twice a week    Gets together: Once a week    Attends religious service: Never    Active member of club or organization: Yes    Attends meetings of clubs or organizations: More than 4 times per year    Relationship status: Married  Other Topics Concern  . Not on file  Social History Narrative   Lives with husband in two-story home, has 3 sons and 5 grandchildren in [place] area      Active with volunteering at school.     Enjoys gardening and using stationary bike to cycle       Outpatient Encounter Medications as of 03/13/2018  Medication Sig  . Ascorbic Acid (VITAMIN C PO) Take by mouth.  Marland Kitchen atenolol (TENORMIN) 25 MG tablet Take 1 tablet (25 mg total) by mouth daily.  Marland Kitchen CALCIUM PO Take 1 tablet by mouth daily.  . Cholecalciferol (VITAMIN D PO) Take by mouth.  Marland Kitchen ibuprofen (ADVIL,MOTRIN) 200 MG tablet Take 200 mg by mouth every 6 (six) hours as needed.  . loratadine (CLARITIN) 10 MG tablet Take 10 mg by mouth daily as needed.   . Multiple Vitamins-Calcium (ONE-A-DAY WOMENS PO) Take 1 tablet by mouth daily.  . naproxen (NAPROSYN) 500 MG tablet TAKE ONE TABLET BY MOUTH TWICE DAILY with a meal  . NON FORMULARY Vitamin B  . polyethylene glycol (MIRALAX / GLYCOLAX) packet Take 17 g by mouth daily as needed.  Marland Kitchen VITAMIN A PO Take by mouth.  Marland Kitchen VITAMIN E PO Take by mouth.  Marland Kitchen aspirin 81 MG chewable tablet Chew by mouth daily.  . magnesium 30 MG tablet Take 30 mg by mouth 2 (two) times daily.   No facility-administered encounter medications on file as of 03/13/2018.     Activities of Daily Living In your present state of health, do you have any difficulty performing the following activities: 03/13/2018  Hearing? N  Vision? N  Difficulty concentrating or making decisions? N  Walking or climbing stairs? Y  Comment L knee pain  Dressing or bathing? N  Doing errands, shopping? N  Preparing Food and eating ? N  Using the Toilet? N  In the past six months, have you accidently leaked urine? N  Do you have problems with loss of bowel control? N  Managing your Medications? N  Managing your Finances? N  Housekeeping or managing your Housekeeping? N  Some recent data might be hidden    Patient Care Team: Shirline Frees, NP as PCP - General (Family Medicine) Mckinley Jewel, MD as Consulting Physician (Ophthalmology)    Assessment:   This is a routine wellness examination for [place]. Physical assessment  deferred to PCP.   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      Goals    . Patient Stated     Continue staying physically and mentally active, engaged with your volunteer work!        Fall Risk Fall Risk  03/13/2018 09/07/2017 09/05/2016 08/27/2015 12/14/2014  Falls in the past year? 0 No No No No  Risk for fall due to : Other (Comment) - - - -  Risk for fall due to: Comment L knee pain - - - -  Follow up Education provided;Falls prevention discussed - - - -    Depression Screen PHQ 2/9 Scores 03/13/2018 09/07/2017 09/05/2016 08/27/2015  PHQ - 2 Score 0 0 0 0  PHQ- 9 Score 0 - - -     Cognitive Function       Ad8 score reviewed for issues:  Issues making decisions: no  Less interest in hobbies / activities: no  Repeats questions, stories (family complaining): no  Trouble using ordinary gadgets (microwave, computer, phone):no  Forgets the month or year: no  Mismanaging finances: no  Remembering appts: no  Daily problems with thinking and/or memory: no Ad8 score is= 0   Immunization History  Administered Date(s) Administered  . Hepatitis A 09/07/2017  . Influenza Split 11/16/2010  . Influenza Whole 12/13/2006, 02/25/2009  . Influenza, High Dose Seasonal PF 10/28/2017  . Influenza-Unspecified 12/29/2014, 12/27/2016  . Pneumococcal Conjugate-13 06/26/2013  . Pneumococcal Polysaccharide-23 02/27/2005, 06/17/2012  . Td 02/27/1997, 01/22/2008  . Tdap 09/07/2016    Qualifies  for Shingles Vaccine? Yes, advised to receive shingrix series at local pharmacy.  Screening Tests Health Maintenance  Topic Date Due  . INFLUENZA VACCINE  09/27/2017  . TETANUS/TDAP  09/08/2026  . DEXA SCAN  Completed  . PNA vac Low Risk Adult  Completed       Plan:    Continue doing brain stimulating activities (puzzles, reading, adult coloring books, staying active) to keep memory sharp.   Continue staying active gardening, volunteering.  Keep monitoring blood pressure and taking medications as prescribed.   Stop at your local pharmacy to ask about shingles (shingrix) vaccine.  Will follow-up with your new doctor regarding your request to have liver checked. If need to fast for labwork, will call you to let you know before Friday's appointmen  I have personally reviewed and noted the following in the patient's chart:   . Medical and social history . Use of alcohol, tobacco or illicit drugs  . Current medications and supplements . Functional ability and status . Nutritional status . Physical activity . Advanced directives . List of other physicians . Vitals . Screenings to include cognitive, depression, and falls . Referrals and appointments  In addition, I have reviewed and discussed with patient certain preventive protocols, quality metrics, and best practice recommendations. A written personalized care plan for preventive services as well as general preventive health recommendations were provided to patient.     Brayton LaymanEmily R Mellen, RN  03/13/2018

## 2018-03-13 NOTE — Patient Instructions (Addendum)
Continue doing brain stimulating activities (puzzles, reading, adult coloring books, staying active) to keep memory sharp.   Continue staying active gardening, volunteering.  Keep monitoring blood pressure and taking medications as prescribed.   Stop at your local pharmacy to ask about shingles (shingrix) vaccine.  Will follow-up with your new doctor regarding your request to have liver checked. If need to fast for labwork, will call you to let you know before Friday's appointment.   Fall Prevention in the Home, Adult Falls can cause injuries. They can happen to people of all ages. There are many things you can do to make your home safe and to help prevent falls. Ask for help when making these changes, if needed. What actions can I take to prevent falls? General Instructions  Use good lighting in all rooms. Replace any light bulbs that burn out.  Turn on the lights when you go into a dark area. Use night-lights.  Keep items that you use often in easy-to-reach places. Lower the shelves around your home if necessary.  Set up your furniture so you have a clear path. Avoid moving your furniture around.  Do not have throw rugs and other things on the floor that can make you trip.  Avoid walking on wet floors.  If any of your floors are uneven, fix them.  Add color or contrast paint or tape to clearly mark and help you see: ? Any grab bars or handrails. ? First and last steps of stairways. ? Where the edge of each step is.  If you use a stepladder: ? Make sure that it is fully opened. Do not climb a closed stepladder. ? Make sure that both sides of the stepladder are locked into place. ? Ask someone to hold the stepladder for you while you use it.  If there are any pets around you, be aware of where they are. What can I do in the bathroom?      Keep the floor dry. Clean up any water that spills onto the floor as soon as it happens.  Remove soap buildup in the tub or shower  regularly.  Use non-skid mats or decals on the floor of the tub or shower.  Attach bath mats securely with double-sided, non-slip rug tape.  If you need to sit down in the shower, use a plastic, non-slip stool.  Install grab bars by the toilet and in the tub and shower. Do not use towel bars as grab bars. What can I do in the bedroom?  Make sure that you have a light by your bed that is easy to reach.  Do not use any sheets or blankets that are too big for your bed. They should not hang down onto the floor.  Have a firm chair that has side arms. You can use this for support while you get dressed. What can I do in the kitchen?  Clean up any spills right away.  If you need to reach something above you, use a strong step stool that has a grab bar.  Keep electrical cords out of the way.  Do not use floor polish or wax that makes floors slippery. If you must use wax, use non-skid floor wax. What can I do with my stairs?  Do not leave any items on the stairs.  Make sure that you have a light switch at the top of the stairs and the bottom of the stairs. If you do not have them, ask someone to add them for  you.  Make sure that there are handrails on both sides of the stairs, and use them. Fix handrails that are broken or loose. Make sure that handrails are as long as the stairways.  Install non-slip stair treads on all stairs in your home.  Avoid having throw rugs at the top or bottom of the stairs. If you do have throw rugs, attach them to the floor with carpet tape.  Choose a carpet that does not hide the edge of the steps on the stairway.  Check any carpeting to make sure that it is firmly attached to the stairs. Fix any carpet that is loose or worn. What can I do on the outside of my home?  Use bright outdoor lighting.  Regularly fix the edges of walkways and driveways and fix any cracks.  Remove anything that might make you trip as you walk through a door, such as a raised  step or threshold.  Trim any bushes or trees on the path to your home.  Regularly check to see if handrails are loose or broken. Make sure that both sides of any steps have handrails.  Install guardrails along the edges of any raised decks and porches.  Clear walking paths of anything that might make someone trip, such as tools or rocks.  Have any leaves, snow, or ice cleared regularly.  Use sand or salt on walking paths during winter.  Clean up any spills in your garage right away. This includes grease or oil spills. What other actions can I take?  Wear shoes that: ? Have a low heel. Do not wear high heels. ? Have rubber bottoms. ? Are comfortable and fit you well. ? Are closed at the toe. Do not wear open-toe sandals.  Use tools that help you move around (mobility aids) if they are needed. These include: ? Canes. ? Walkers. ? Scooters. ? Crutches.  Review your medicines with your doctor. Some medicines can make you feel dizzy. This can increase your chance of falling. Ask your doctor what other things you can do to help prevent falls. Where to find more information  Centers for Disease Control and Prevention, STEADI: https://garcia.biz/  [employer] on Aging: BrainJudge.co.uk Contact a doctor if:  You are afraid of falling at home.  You feel weak, drowsy, or dizzy at home.  You fall at home. Summary  There are many simple things that you can do to make your home safe and to help prevent falls.  Ways to make your home safe include removing tripping hazards and installing grab bars in the bathroom.  Ask for help when making these changes in your home. This information is not intended to replace advice given to you by your health care provider. Make sure you discuss any questions you have with your health care provider. Document Released: 12/10/2008 Document Revised: 09/28/2016 Document Reviewed: 09/28/2016 Elsevier Interactive Patient Education  2019  [place] Maintenance, Female Adopting a healthy lifestyle and getting preventive care can go a long way to promote health and wellness. Talk with your health care provider about what schedule of regular examinations is right for you. This is a good chance for you to check in with your provider about disease prevention and staying healthy. In between checkups, there are plenty of things you can do on your own. Experts have done a lot of research about which lifestyle changes and preventive measures are most likely to keep you healthy. Ask your health care provider for more information.  Weight and diet Eat a healthy diet  Be sure to include plenty of vegetables, fruits, low-fat dairy products, and lean protein.  Do not eat a lot of foods high in solid fats, added sugars, or salt.  Get regular exercise. This is one of the most important things you can do for your health. ? Most adults should exercise for at least 150 minutes each week. The exercise should increase your heart rate and make you sweat (moderate-intensity exercise). ? Most adults should also do strengthening exercises at least twice a week. This is in addition to the moderate-intensity exercise. Maintain a healthy weight  Body mass index (BMI) is a measurement that can be used to identify possible weight problems. It estimates body fat based on height and weight. Your health care provider can help determine your BMI and help you achieve or maintain a healthy weight.  For females 4 years of age and older: ? A BMI below 18.5 is considered underweight. ? A BMI of 18.5 to 24.9 is normal. ? A BMI of 25 to 29.9 is considered overweight. ? A BMI of 30 and above is considered obese. Watch levels of cholesterol and blood lipids  You should start having your blood tested for lipids and cholesterol at 79 years of age, then have this test every 5 years.  You may need to have your cholesterol levels checked more often  if: ? Your lipid or cholesterol levels are high. ? You are older than 79 years of age. ? You are at high risk for heart disease. Cancer screening Lung Cancer  Lung cancer screening is recommended for adults 84-2 years old who are at high risk for lung cancer because of a history of smoking.  A yearly low-dose CT scan of the lungs is recommended for people who: ? Currently smoke. ? Have quit within the past 15 years. ? Have at least a 30-pack-year history of smoking. A pack year is smoking an average of one pack of cigarettes a day for 1 year.  Yearly screening should continue until it has been 15 years since you quit.  Yearly screening should stop if you develop a health problem that would prevent you from having lung cancer treatment. Breast Cancer  Practice breast self-awareness. This means understanding how your breasts normally appear and feel.  It also means doing regular breast self-exams. Let your health care provider know about any changes, no matter how small.  If you are in your 20s or 30s, you should have a clinical breast exam (CBE) by a health care provider every 1-3 years as part of a regular health exam.  If you are 54 or older, have a CBE every year. Also consider having a breast X-ray (mammogram) every year.  If you have a family history of breast cancer, talk to your health care provider about genetic screening.  If you are at high risk for breast cancer, talk to your health care provider about having an MRI and a mammogram every year.  Breast cancer gene (BRCA) assessment is recommended for women who have family members with BRCA-related cancers. BRCA-related cancers include: ? Breast. ? Ovarian. ? Tubal. ? Peritoneal cancers.  Results of the assessment will determine the need for genetic counseling and BRCA1 and BRCA2 testing. Cervical Cancer Your health care provider may recommend that you be screened regularly for cancer of the pelvic organs (ovaries,  uterus, and vagina). This screening involves a pelvic examination, including checking for microscopic changes to the surface of  your cervix (Pap test). You may be encouraged to have this screening done every 3 years, beginning at age 19.  For women ages 30-65, health care providers may recommend pelvic exams and Pap testing every 3 years, or they may recommend the Pap and pelvic exam, combined with testing for human papilloma virus (HPV), every 5 years. Some types of HPV increase your risk of cervical cancer. Testing for HPV may also be done on women of any age with unclear Pap test results.  Other health care providers may not recommend any screening for nonpregnant women who are considered low risk for pelvic cancer and who do not have symptoms. Ask your health care provider if a screening pelvic exam is right for you.  If you have had past treatment for cervical cancer or a condition that could lead to cancer, you need Pap tests and screening for cancer for at least 20 years after your treatment. If Pap tests have been discontinued, your risk factors (such as having a new sexual partner) need to be reassessed to determine if screening should resume. Some women have medical problems that increase the chance of getting cervical cancer. In these cases, your health care provider may recommend more frequent screening and Pap tests. Colorectal Cancer  This type of cancer can be detected and often prevented.  Routine colorectal cancer screening usually begins at 79 years of age and continues through 79 years of age.  Your health care provider may recommend screening at an earlier age if you have risk factors for colon cancer.  Your health care provider may also recommend using home test kits to check for hidden blood in the stool.  A small camera at the end of a tube can be used to examine your colon directly (sigmoidoscopy or colonoscopy). This is done to check for the earliest forms of colorectal  cancer.  Routine screening usually begins at age 41.  Direct examination of the colon should be repeated every 5-10 years through 79 years of age. However, you may need to be screened more often if early forms of precancerous polyps or small growths are found. Skin Cancer  Check your skin from head to toe regularly.  Tell your health care provider about any new moles or changes in moles, especially if there is a change in a mole's shape or color.  Also tell your health care provider if you have a mole that is larger than the size of a pencil eraser.  Always use sunscreen. Apply sunscreen liberally and repeatedly throughout the day.  Protect yourself by wearing long sleeves, pants, a wide-brimmed hat, and sunglasses whenever you are outside. Heart disease, diabetes, and high blood pressure  High blood pressure causes heart disease and increases the risk of stroke. High blood pressure is more likely to develop in: ? People who have blood pressure in the high end of the normal range (130-139/85-89 mm Hg). ? People who are overweight or obese. ? People who are African American.  If you are 48-83 years of age, have your blood pressure checked every 3-5 years. If you are 28 years of age or older, have your blood pressure checked every year. You should have your blood pressure measured twice-once when you are at a hospital or clinic, and once when you are not at a hospital or clinic. Record the average of the two measurements. To check your blood pressure when you are not at a hospital or clinic, you can use: ? An automated blood pressure  machine at a pharmacy. ? A home blood pressure monitor.  If you are between 48 years and 35 years old, ask your health care provider if you should take aspirin to prevent strokes.  Have regular diabetes screenings. This involves taking a blood sample to check your fasting blood sugar level. ? If you are at a normal weight and have a low risk for diabetes,  have this test once every three years after 80 years of age. ? If you are overweight and have a high risk for diabetes, consider being tested at a younger age or more often. Preventing infection Hepatitis B  If you have a higher risk for hepatitis B, you should be screened for this virus. You are considered at high risk for hepatitis B if: ? You were born in a country where hepatitis B is common. Ask your health care provider which countries are considered high risk. ? Your parents were born in a high-risk country, and you have not been immunized against hepatitis B (hepatitis B vaccine). ? You have HIV or AIDS. ? You use needles to inject street drugs. ? You live with someone who has hepatitis B. ? You have had sex with someone who has hepatitis B. ? You get hemodialysis treatment. ? You take certain medicines for conditions, including cancer, organ transplantation, and autoimmune conditions. Hepatitis C  Blood testing is recommended for: ? Everyone born from [birth year] through 1966. ? Anyone with known risk factors for hepatitis C. Sexually transmitted infections (STIs)  You should be screened for sexually transmitted infections (STIs) including gonorrhea and chlamydia if: ? You are sexually active and are younger than 79 years of age. ? You are older than 79 years of age and your health care provider tells you that you are at risk for this type of infection. ? Your sexual activity has changed since you were last screened and you are at an increased risk for chlamydia or gonorrhea. Ask your health care provider if you are at risk.  If you do not have HIV, but are at risk, it may be recommended that you take a prescription medicine daily to prevent HIV infection. This is called pre-exposure prophylaxis (PrEP). You are considered at risk if: ? You are sexually active and do not regularly use condoms or know the HIV status of your partner(s). ? You take drugs by injection. ? You are sexually  active with a partner who has HIV. Talk with your health care provider about whether you are at high risk of being infected with HIV. If you choose to begin PrEP, you should first be tested for HIV. You should then be tested every 3 months for as long as you are taking PrEP. Pregnancy  If you are premenopausal and you may become pregnant, ask your health care provider about preconception counseling.  If you may become pregnant, take 400 to 800 micrograms (mcg) of folic acid every day.  If you want to prevent pregnancy, talk to your health care provider about birth control (contraception). Osteoporosis and menopause  Osteoporosis is a disease in which the bones lose minerals and strength with aging. This can result in serious bone fractures. Your risk for osteoporosis can be identified using a bone density scan.  If you are 75 years of age or older, or if you are at risk for osteoporosis and fractures, ask your health care provider if you should be screened.  Ask your health care provider whether you should take a calcium or vitamin  D supplement to lower your risk for osteoporosis.  Menopause may have certain physical symptoms and risks.  Hormone replacement therapy may reduce some of these symptoms and risks. Talk to your health care provider about whether hormone replacement therapy is right for you. Follow these instructions at home:  Schedule regular health, dental, and eye exams.  Stay current with your immunizations.  Do not use any tobacco products including cigarettes, chewing tobacco, or electronic cigarettes.  If you are pregnant, do not drink alcohol.  If you are breastfeeding, limit how much and how often you drink alcohol.  Limit alcohol intake to no more than 1 drink per day for nonpregnant women. One drink equals 12 ounces of beer, 5 ounces of wine, or 1 ounces of hard liquor.  Do not use street drugs.  Do not share needles.  Ask your health care provider for  help if you need support or information about quitting drugs.  Tell your health care provider if you often feel depressed.  Tell your health care provider if you have ever been abused or do not feel safe at home. This information is not intended to replace advice given to you by your health care provider. Make sure you discuss any questions you have with your health care provider. Document Released: 08/29/2010 Document Revised: 07/22/2015 Document Reviewed: 11/17/2014 Elsevier Interactive Patient Education  2019 [employer].

## 2018-03-13 NOTE — Telephone Encounter (Signed)
During AWV, pt. expressed concern over liver function based on what she has been reading recently, and wanted to speak with new PCP regarding getting labs. Pt. transferring care from [place] to [place] as pt. stated she would be more comfortable with a female provider at this time. Pt. concerned about getting PAP smear, routed to Dr. Hassan Rowan to advise in light of age. It appears pt. Is due for DEXA scan per last report on 07/2015. Order left pended for provider approval. Routed to Dr. Hassan Rowan to advise on all of the above, scheduled for [facility] on 1/17 with Dr. Hassan Rowan.

## 2018-03-13 NOTE — Telephone Encounter (Signed)
Her last liver function was completely normal..Marland KitchenMarland Kitchen

## 2018-03-15 ENCOUNTER — Ambulatory Visit (INDEPENDENT_AMBULATORY_CARE_PROVIDER_SITE_OTHER): Payer: Medicare Other | Admitting: Family Medicine

## 2018-03-15 ENCOUNTER — Ambulatory Visit (INDEPENDENT_AMBULATORY_CARE_PROVIDER_SITE_OTHER): Payer: Medicare Other

## 2018-03-15 ENCOUNTER — Encounter: Payer: Self-pay | Admitting: Family Medicine

## 2018-03-15 VITALS — BP 140/78 | HR 79 | Temp 97.8°F | Wt 111.8 lb

## 2018-03-15 DIAGNOSIS — R079 Chest pain, unspecified: Secondary | ICD-10-CM

## 2018-03-15 DIAGNOSIS — I10 Essential (primary) hypertension: Secondary | ICD-10-CM | POA: Diagnosis not present

## 2018-03-15 DIAGNOSIS — R05 Cough: Secondary | ICD-10-CM | POA: Diagnosis not present

## 2018-03-15 NOTE — Progress Notes (Signed)
Cassidy PeakChristine K Bennett DOB: 1940/03/03 Encounter date: 03/15/2018  This is a 78 y.o. female who presents with Chief Complaint  Patient presents with  . Transitions Of Care    history of TB, complains of thick mucus, pt states BP has been high at home, hot flash,    History of present illness:  Would like to have xray again due to hx of TB. Has some mucous in throat every morning. Sometimes coughs it up, but sometimes harder to get up. Might be sinus related. Also with some pain left ear. Takes advil which does help, but then sx come back next day. Has been going on for a month. No fevers, night sweats, weight loss. Has had hot flashes coming and going since menopause.   Husband explains that [place] but that on xray wasn't noted until she came here. Was treated here for 3 years and supposedly cleared up after treatment.   Blood pressure has been high at home 150-160 at home. Wondering about cuff not working properly.     No Known Allergies Current Meds  Medication Sig  . Ascorbic Acid (VITAMIN C PO) Take by mouth.  Marland Kitchen. aspirin 81 MG chewable tablet Chew by mouth daily.  Marland Kitchen. atenolol (TENORMIN) 25 MG tablet Take 1 tablet (25 mg total) by mouth daily.  Marland Kitchen. CALCIUM PO Take 1 tablet by mouth daily.  . Cholecalciferol (VITAMIN D PO) Take by mouth.  Marland Kitchen. ibuprofen (ADVIL,MOTRIN) 200 MG tablet Take 200 mg by mouth every 6 (six) hours as needed.  . loratadine (CLARITIN) 10 MG tablet Take 10 mg by mouth daily as needed.   . magnesium 30 MG tablet Take 30 mg by mouth 2 (two) times daily.  . Multiple Vitamins-Calcium (ONE-A-DAY WOMENS PO) Take 1 tablet by mouth daily.  . naproxen (NAPROSYN) 500 MG tablet TAKE ONE TABLET BY MOUTH TWICE DAILY with a meal  . NON FORMULARY Vitamin B  . polyethylene glycol (MIRALAX / GLYCOLAX) packet Take 17 g by mouth daily as needed.  Marland Kitchen. VITAMIN A PO Take by mouth.  Marland Kitchen. VITAMIN E PO Take by mouth.    Review of Systems  Constitutional:  Negative for chills, fatigue and fever.  Respiratory: Negative for cough, chest tightness, shortness of breath and wheezing.   Cardiovascular: Positive for chest pain (noted a few days ago when going upstairs; brief second. Then happened a month ago. Split second. Does go up and down steps multiple times every day without symptoms). Negative for palpitations and leg swelling.    Objective:  BP 140/78 (BP Location: Left Arm, Patient Position: Sitting, Cuff Size: Normal)   Pulse 79   Temp 97.8 F (36.6 C) (Oral)   Wt 111 lb 12.8 oz (50.7 kg)   SpO2 96%   BMI 20.45 kg/m   Weight: 111 lb 12.8 oz (50.7 kg)   BP Readings from Last 3 Encounters:  03/15/18 140/78  03/13/18 (!) 166/80  10/04/17 120/76   Wt Readings from Last 3 Encounters:  03/15/18 111 lb 12.8 oz (50.7 kg)  03/13/18 118 lb (53.5 kg)  10/04/17 117 lb (53.1 kg)    Physical Exam Constitutional:      General: She is not in acute distress.    Appearance: She is well-developed.  Cardiovascular:     Rate and Rhythm: Normal rate and regular rhythm.     Heart sounds: Normal heart sounds. No murmur. No friction rub.  Pulmonary:     Effort: Pulmonary effort  is normal. No respiratory distress.     Breath sounds: Examination of the right-lower field reveals rales. Examination of the left-lower field reveals rales. Rales present. No wheezing.     Comments: Minimal rales bilat lung bases. Suspect atelectasis. Musculoskeletal:     Right lower leg: No edema.     Left lower leg: No edema.  Neurological:     Mental Status: She is alert and oriented to person, place, and time.  Psychiatric:        Behavior: Behavior normal.     Assessment/Plan 1. Chest pain, unspecified type Will get CXR due to concerns and exam. EKG appears stable from previous 2 years ago.    - DG Chest 2 View; Future - EKG 12-Lead   2. HTN: please continue to check bp at home and return in 3 weeks time to recheck in office. Numbers here today are ok.  Bring cuff in to check against ours at next visit.   Return pending xray.    Theodis Shove, MD

## 2018-03-15 NOTE — Telephone Encounter (Signed)
You can hold this until her CXR results come back and complete in single phone call.  I signed the pended DEXA. We didn't discuss pap today, but if she didn't have a couple of normal ones completed in the 10 years prior to age 78, we should do a final one. She can schedule separate appointment for this (unless she sees obgyn; they can do it)  Liver function was normal on recent bloodwork. Get specifics of what she was reading. I don't see that she had Hep C screening ever? So if reading about this we could certainly order. I didn't order bloodwork today but may do so pending xray results.

## 2018-03-16 ENCOUNTER — Encounter: Payer: Self-pay | Admitting: Family Medicine

## 2018-03-18 NOTE — Telephone Encounter (Signed)
Author phoned pt. to relay CXR results and PCP recommendations.Pt. stated she still has a cough, but seems to be better. Pt. Stated she is no longer concerned with getting her PAP smear done, and she will wait to hear from imaging regarding scheduling her DEXA scan. Pt. states she is open to getting her hep C checked, having cited in AWV that she saw on TV something about her tongue being in poor health and thought she needed her liver checked. Hep C order left pended for PCP review.

## 2018-03-20 NOTE — Telephone Encounter (Signed)
Signed. Thanks for the follow up.

## 2018-04-05 ENCOUNTER — Telehealth: Payer: Self-pay | Admitting: *Deleted

## 2018-04-05 DIAGNOSIS — M25562 Pain in left knee: Secondary | ICD-10-CM | POA: Diagnosis not present

## 2018-04-05 DIAGNOSIS — M1712 Unilateral primary osteoarthritis, left knee: Secondary | ICD-10-CM | POA: Diagnosis not present

## 2018-04-05 NOTE — Telephone Encounter (Signed)
Prolia injection due after 04/27/18 Waiting on insurance verification

## 2018-04-22 ENCOUNTER — Telehealth: Payer: Self-pay | Admitting: Family Medicine

## 2018-04-22 NOTE — Telephone Encounter (Signed)
Patient is having surgery on 05/20/18.  Patient needs a letter of Medical Clearance for her to be able to have said surgery.   [phone] - Home- leave message when complete

## 2018-04-22 NOTE — Telephone Encounter (Signed)
Please advise. Does patient need an appointment for pre-opt?

## 2018-04-22 NOTE — Telephone Encounter (Signed)
Spoke with patient. Appointment has been scheduled for next Wednesday.

## 2018-04-22 NOTE — Telephone Encounter (Signed)
Yes she will need preoperative visit

## 2018-04-24 ENCOUNTER — Encounter: Payer: Self-pay | Admitting: Student

## 2018-04-24 NOTE — H&P (Signed)
TOTAL KNEE ADMISSION H&P  Patient is being admitted for left total knee arthroplasty.  Subjective:  Chief Complaint:left knee pain.  HPI: Cassidy Bennett, 78 y.o. female, has a history of pain and functional disability in the left knee due to arthritis and has failed non-surgical conservative treatments for greater than 12 weeks to includecorticosteriod injections, viscosupplementation injections and activity modification.  Onset of symptoms was gradual, starting 5 years ago with gradually worsening course since that time. The patient noted prior procedures on the knee to include  arthroscopy on the left knee(s).  Patient currently rates pain in the left knee(s) at 9 out of 10 with activity. Patient has worsening of pain with activity and weight bearing, pain that interferes with activities of daily living and crepitus.  Patient has evidence of essentially bone-on-bone arthritis in the medial compartment with significant patellofemoral compartment narrowing by imaging studies. There is no active infection.  There are no active problems to display for this patient.  History reviewed. No pertinent past medical history.  History reviewed. No pertinent surgical history.  No current facility-administered medications for this encounter.    No current outpatient medications on file.   Allergies not on file  Social History   Tobacco Use  . Smoking status: Not on file  Substance Use Topics  . Alcohol use: Not on file    History reviewed. No pertinent family history.   Review of Systems  Constitutional: Negative for chills and fever.  HENT: Negative for congestion, sore throat and tinnitus.   Eyes: Negative for double vision, photophobia and pain.  Respiratory: Negative for cough, shortness of breath and wheezing.   Cardiovascular: Negative for chest pain, palpitations and orthopnea.  Gastrointestinal: Negative for heartburn, nausea and vomiting.  Genitourinary: Negative for dysuria,  frequency and urgency.  Musculoskeletal: Positive for joint pain.  Neurological: Negative for dizziness, weakness and headaches.    Objective:  Physical Exam  Well nourished and well developed.  General: Alert and oriented x3, cooperative and pleasant, no acute distress.  Head: normocephalic, atraumatic, neck supple.  Eyes: EOMI.  Respiratory: breath sounds clear in all fields, no wheezing, rales, or rhonchi. Cardiovascular: Regular rate and rhythm, no murmurs, gallops or rubs.  Abdomen: non-tender to palpation and soft, normoactive bowel sounds. Musculoskeletal:  Left Knee Exam: Minimal varus deformity. No effusion. No Swelling.Range of motion is 0-130 degrees. No crepitus on range of motion of the knee. Positive medial joint line tendernessNo lateral joint line tenderness. Stable knee.  Calves soft and nontender. Motor function intact in LE. Strength 5/5 LE bilaterally. Neuro: Distal pulses 2+. Sensation to light touch intact in LE.  Vital signs in last 24 hours: Blood pressure: 160/84 mmHg Pulse: 72 bpm  Labs:   There is no height or weight on file to calculate BMI.   Imaging Review Plain radiographs demonstrate moderate degenerative joint disease of the left knee(s). The overall alignment isneutral. The bone quality appears to be adequate for age and reported activity level.      Assessment/Plan:  End stage arthritis, left knee   The patient history, physical examination, clinical judgment of the provider and imaging studies are consistent with end stage degenerative joint disease of the left knee(s) and total knee arthroplasty is deemed medically necessary. The treatment options including medical management, injection therapy arthroscopy and arthroplasty were discussed at length. The risks and benefits of total knee arthroplasty were presented and reviewed. The risks due to aseptic loosening, infection, stiffness, patella tracking problems, thromboembolic complications  and  other imponderables were discussed. The patient acknowledged the explanation, agreed to proceed with the plan and consent was signed. Patient is being admitted for inpatient treatment for surgery, pain control, PT, OT, prophylactic antibiotics, VTE prophylaxis, progressive ambulation and ADL's and discharge planning. The patient is planning to be discharged home.    Anticipated LOS equal to or greater than 2 midnights due to - Age 47 and older with one or more of the following:  - Obesity  - Expected need for hospital services (PT, OT, Nursing) required for safe  discharge  - Anticipated need for postoperative skilled nursing care or inpatient rehab  - Active co-morbidities: None OR   - Unanticipated findings during/Post Surgery: None  - Patient is a high risk of re-admission due to: None   Therapy Plans: outpatient therapy at [facility] in [place] Disposition: Home with spouse Planned DVT Prophylaxis: Aspirin 325 mg BID DME needed: Dan Humphreys, 3-in-1 PCP: Theodis Shove, MD TXA: IV Allergies: NKDA Anesthesia Concerns: None BMI: 21.7  - Patient was instructed on what medications to stop prior to surgery. - Follow-up visit in 2 weeks with Dr. Lequita Halt - Begin physical therapy following surgery - Pre-operative lab work as pre-surgical testing - Prescriptions will be provided in hospital at time of discharge  Arther Abbott, PA-C Orthopedic Surgery [facility] [place]

## 2018-04-30 DIAGNOSIS — M25562 Pain in left knee: Secondary | ICD-10-CM | POA: Diagnosis not present

## 2018-05-01 ENCOUNTER — Encounter: Payer: Self-pay | Admitting: Family Medicine

## 2018-05-01 ENCOUNTER — Ambulatory Visit (INDEPENDENT_AMBULATORY_CARE_PROVIDER_SITE_OTHER): Payer: Medicare Other | Admitting: Family Medicine

## 2018-05-01 VITALS — BP 140/80 | HR 72 | Temp 97.8°F | Wt 118.1 lb

## 2018-05-01 DIAGNOSIS — I10 Essential (primary) hypertension: Secondary | ICD-10-CM

## 2018-05-01 DIAGNOSIS — H61891 Other specified disorders of right external ear: Secondary | ICD-10-CM | POA: Diagnosis not present

## 2018-05-01 DIAGNOSIS — M1990 Unspecified osteoarthritis, unspecified site: Secondary | ICD-10-CM

## 2018-05-01 DIAGNOSIS — Z01818 Encounter for other preprocedural examination: Secondary | ICD-10-CM

## 2018-05-01 DIAGNOSIS — E785 Hyperlipidemia, unspecified: Secondary | ICD-10-CM

## 2018-05-01 LAB — CBC WITH DIFFERENTIAL/PLATELET
BASOS ABS: 0.1 10*3/uL (ref 0.0–0.1)
Basophils Relative: 0.8 % (ref 0.0–3.0)
Eosinophils Absolute: 0.1 10*3/uL (ref 0.0–0.7)
Eosinophils Relative: 1.3 % (ref 0.0–5.0)
HEMATOCRIT: 41.3 % (ref 36.0–46.0)
HEMOGLOBIN: 14 g/dL (ref 12.0–15.0)
LYMPHS PCT: 30 % (ref 12.0–46.0)
Lymphs Abs: 2.2 10*3/uL (ref 0.7–4.0)
MCHC: 33.9 g/dL (ref 30.0–36.0)
MCV: 81.5 fl (ref 78.0–100.0)
MONOS PCT: 5.1 % (ref 3.0–12.0)
Monocytes Absolute: 0.4 10*3/uL (ref 0.1–1.0)
NEUTROS ABS: 4.6 10*3/uL (ref 1.4–7.7)
Neutrophils Relative %: 62.8 % (ref 43.0–77.0)
Platelets: 197 10*3/uL (ref 150.0–400.0)
RBC: 5.06 Mil/uL (ref 3.87–5.11)
RDW: 15 % (ref 11.5–15.5)
WBC: 7.3 10*3/uL (ref 4.0–10.5)

## 2018-05-01 LAB — LIPID PANEL
CHOLESTEROL: 228 mg/dL — AB (ref 0–200)
HDL: 58.9 mg/dL (ref >39.00)
LDL Cholesterol: 130 mg/dL — ABNORMAL HIGH (ref 0–99)
NonHDL: 169.09
TRIGLYCERIDES: 196 mg/dL — AB (ref 0.0–149.0)
Total CHOL/HDL Ratio: 4
VLDL: 39.2 mg/dL (ref 0.0–40.0)

## 2018-05-01 LAB — URINALYSIS
Bilirubin Urine: NEGATIVE
Hgb urine dipstick: NEGATIVE
Ketones, ur: NEGATIVE
LEUKOCYTE UA: NEGATIVE
NITRITE: NEGATIVE
Specific Gravity, Urine: 1.02 (ref 1.000–1.030)
Total Protein, Urine: NEGATIVE
UROBILINOGEN UA: 0.2 (ref 0.0–1.0)
Urine Glucose: NEGATIVE
pH: 7 (ref 5.0–8.0)

## 2018-05-01 LAB — COMPREHENSIVE METABOLIC PANEL
ALBUMIN: 4.7 g/dL (ref 3.5–5.2)
ALT: 27 U/L (ref 0–35)
AST: 24 U/L (ref 0–37)
Alkaline Phosphatase: 49 U/L (ref 39–117)
BILIRUBIN TOTAL: 0.6 mg/dL (ref 0.2–1.2)
BUN: 16 mg/dL (ref 6–23)
CALCIUM: 9.7 mg/dL (ref 8.4–10.5)
CHLORIDE: 102 meq/L (ref 96–112)
CO2: 28 meq/L (ref 19–32)
CREATININE: 0.62 mg/dL (ref 0.40–1.20)
GFR: 93.02 mL/min (ref >60.00)
Glucose, Bld: 102 mg/dL — ABNORMAL HIGH (ref 70–99)
Potassium: 4.3 mEq/L (ref 3.5–5.1)
Sodium: 139 mEq/L (ref 135–145)
Total Protein: 8 g/dL (ref 6.0–8.3)

## 2018-05-01 MED ORDER — NEOMYCIN-POLYMYXIN-HC 3.5-10000-1 OT SOLN: 3.0000 [drp] | Freq: Three times a day (TID) | OTIC | 0 refills | Status: AC

## 2018-05-01 NOTE — Progress Notes (Signed)
Cassidy Bennett Date of Birth:  1940/10/06  Had endoscopy 5 years ago in left knee; has been getting shots since then but this time shot didn't work. Feels well overall except for knee. Pain is daily and getting more severe. Activity level is limited significantly due to pain.   This patient presents to the office today for a preoperative consultation at the request of surgeon, Dr. Lequita Halt, who plans on performing TKA left knee on March 23 at.    Planned anesthesia: general  Known anesthesia problems: none with recent endoscopy, but had excessive vomiting with D and C.   Bleeding risk: none Personal or FH of DVT/PE: no personal or family history  Patient Active Problem List   Diagnosis Date Noted  . Closed displaced fracture of proximal phalanx of right great toe 01/03/2017  . Rotator cuff syndrome of right shoulder 01/03/2017  . Hyperlipidemia 09/05/2016  . Essential hypertension 08/27/2015  . Urine abnormality 03/29/2015  . Routine general medical examination at a health care facility 06/26/2013  . History of TB (tuberculosis) 06/17/2012  . ALLERGIC RHINITIS 01/22/2008  . Osteoporosis 12/13/2006  . Osteoarthritis 10/11/2006   Past Surgical History:  Procedure Laterality Date  . CATARACT EXTRACTION  2018  . DILATION AND CURETTAGE OF UTERUS  2000  . KNEE ARTHROSCOPY  2014   left  . LUMBAR DISC SURGERY    . SHOULDER SURGERY     right    No Known Allergies Current Meds  Medication Sig  . Ascorbic Acid (VITAMIN C PO) Take by mouth.  Marland Kitchen aspirin 81 MG chewable tablet Chew by mouth daily.  Marland Kitchen atenolol (TENORMIN) 25 MG tablet Take 1 tablet (25 mg total) by mouth daily.  Marland Kitchen CALCIUM PO Take 1 tablet by mouth daily.  . Cholecalciferol (VITAMIN D PO) Take by mouth.  Marland Kitchen ibuprofen (ADVIL,MOTRIN) 200 MG tablet Take 200 mg by mouth every 6 (six) hours as needed.  . loratadine (CLARITIN) 10 MG tablet Take 10 mg by mouth daily as needed.   . magnesium 30 MG tablet Take 30 mg by mouth 2  (two) times daily.  . Multiple Vitamins-Calcium (ONE-A-DAY WOMENS PO) Take 1 tablet by mouth daily.  . naproxen (NAPROSYN) 500 MG tablet TAKE ONE TABLET BY MOUTH TWICE DAILY with a meal  . NON FORMULARY Vitamin B  . polyethylene glycol (MIRALAX / GLYCOLAX) packet Take 17 g by mouth daily as needed.  Marland Kitchen VITAMIN A PO Take by mouth.  Marland Kitchen VITAMIN E PO Take by mouth.    Social History   Tobacco Use  . Smoking status: Never Smoker  . Smokeless tobacco: Never Used  Substance Use Topics  . Alcohol use: Yes    Alcohol/week: 5.0 standard drinks    Types: 5 Glasses of wine per week   Family History  Problem Relation Age of Onset  . Other Father        malaria  . Cancer Other        stomach    Review of Systems ROS is positive only for pain left knee. She has no cough, shortness of breath, chest pain at rest or with exertion. She has no sick symptoms. No other pains. She has mild right ear canal itching which we will address with drops today.   Recent Labs: CBC:  Lab Results  Component Value Date   WBC 7.3 05/01/2018   HGB 14.0 05/01/2018   HCT 41.3 05/01/2018   MCHC 33.9 05/01/2018   RDW 15.0 05/01/2018  PLT 197.0 05/01/2018   CMP:  Lab Results  Component Value Date   NA 139 05/01/2018   K 4.3 05/01/2018   CL 102 05/01/2018   CO2 28 05/01/2018   GLUCOSE 102 (H) 05/01/2018   GLUCOSE 94 01/09/2006   BUN 16 05/01/2018   CREATININE 0.62 05/01/2018   GFRAA 107 01/14/2008   CALCIUM 9.7 05/01/2018   PROT 8.0 05/01/2018   BILITOT 0.6 05/01/2018   ALKPHOS 49 05/01/2018   ALT 27 05/01/2018   AST 24 05/01/2018    HBA1C: No results found for: LABA1C, EAG  Objective:   BP 140/80 (BP Location: Left Arm, Patient Position: Sitting, Cuff Size: Normal)   Pulse 72   Temp 97.8 F (36.6 C) (Oral)   Wt 118 lb 1.6 oz (53.6 kg)   SpO2 98%   BMI 21.60 kg/m  Weight: 118 lb 1.6 oz (53.6 kg)  Physical Exam Constitutional:      General: She is not in acute distress.    Appearance:  She is well-developed.  HENT:     Head: Normocephalic and atraumatic.     Right Ear: External ear normal.     Left Ear: Ear canal and external ear normal.     Ears:     Comments: There is some scaling and slight erythema right ear canal    Mouth/Throat:     Pharynx: No oropharyngeal exudate.  Eyes:     Conjunctiva/sclera: Conjunctivae normal.     Pupils: Pupils are equal, round, and reactive to light.  Neck:     Musculoskeletal: Normal range of motion and neck supple.     Thyroid: No thyromegaly.  Cardiovascular:     Rate and Rhythm: Normal rate and regular rhythm.     Heart sounds: Normal heart sounds. No murmur. No friction rub. No gallop.   Pulmonary:     Effort: Pulmonary effort is normal.     Breath sounds: Normal breath sounds.  Abdominal:     General: Bowel sounds are normal. There is no distension.     Palpations: Abdomen is soft. There is no mass.     Tenderness: There is no abdominal tenderness. There is no guarding.     Hernia: No hernia is present.  Musculoskeletal: Normal range of motion.        General: No tenderness or deformity.     Comments: Difficulty with walking due to pain in knee.   Lymphadenopathy:     Cervical: No cervical adenopathy.  Skin:    General: Skin is warm and dry.     Findings: No rash.  Neurological:     Mental Status: She is alert and oriented to person, place, and time.     Deep Tendon Reflexes: Reflexes normal.     Reflex Scores:      Tricep reflexes are 2+ on the right side and 2+ on the left side.      Bicep reflexes are 2+ on the right side and 2+ on the left side.      Brachioradialis reflexes are 2+ on the right side and 2+ on the left side.      Patellar reflexes are 2+ on the right side and 2+ on the left side. Psychiatric:        Speech: Speech normal.        Behavior: Behavior normal.        Thought Content: Thought content normal.      EKG Interpretation: none completed today. She had stable EKG  from Jan 2020 which was  reviewed today.  Lab Review will review once completed and attach to presurgical paperwork.    Assessment:   Amijah was seen today for surgical clearance.  Diagnoses and all orders for this visit:  Essential hypertension- stable. I am not going to change medications today, but we will continue to monitor. She has some pain and with upcoming surgery I expect some fluctuation. We will continue to assess and have her f/u after she has recovered from surgery. -     CBC with Differential/Platelet; Future -     Comprehensive metabolic panel; Future  Osteoarthritis, unspecified osteoarthritis type, unspecified site  Hyperlipidemia, unspecified hyperlipidemia type -     Comprehensive metabolic panel; Future -     Lipid panel; Future  Preoperative clearance -     Urinalysis  Ear canal dryness, right - cortisporin drops as directed.  Other orders -     neomycin-polymyxin-hydrocortisone (CORTISPORIN) OTIC solution; Place 3 drops into the right ear 3 (three) times daily for 7 days.   78 y.o.patient  approved for Surgery     Plan:   1. Preoperative workup as follows: baseline bloodwork; make sure no anemia prior to surgery and will check renal function (esp since taking increased anti-inflammatories) 2. Change in medication regimen before surgery: stop ASA 1 week prior 3. No contraindications to planned surgery  Note electronically signed by provider.  Theodis Shove, MD

## 2018-05-03 NOTE — Telephone Encounter (Signed)
Prolia injection available for patient at no cost. Appointment scheduled Next Prolia injection due 11/04/2018

## 2018-05-08 ENCOUNTER — Ambulatory Visit (INDEPENDENT_AMBULATORY_CARE_PROVIDER_SITE_OTHER): Payer: Medicare Other | Admitting: *Deleted

## 2018-05-08 ENCOUNTER — Other Ambulatory Visit: Payer: Self-pay

## 2018-05-08 DIAGNOSIS — M1990 Unspecified osteoarthritis, unspecified site: Secondary | ICD-10-CM

## 2018-05-08 MED ORDER — DENOSUMAB 60 MG/ML ~~LOC~~ SOSY
60.0000 mg | PREFILLED_SYRINGE | Freq: Once | SUBCUTANEOUS | Status: AC
  Administered 2018-05-08: 60 mg via SUBCUTANEOUS

## 2018-05-08 NOTE — Progress Notes (Signed)
Per orders of Dr. Koberlein, injection of Prolia 60mg given by Kaylany Tesoriero A. Patient tolerated injection well. 

## 2018-05-09 NOTE — Patient Instructions (Addendum)
Cassidy Bennett  05/09/2018   Your procedure is scheduled on: 05-20-18   Report to [facility] Main  Entrance                Report to admitting at       0800 AM    Call this number if you have problems the morning of surgery [phone]   Remember: Do not eat food  Or drink liquids  :After Midnight.  _____________________________________________________________________    BRUSH YOUR TEETH MORNING OF SURGERY AND RINSE YOUR MOUTH OUT, NO CHEWING GUM CANDY OR MINTS.     Take these medicines the morning of surgery with A SIP OF WATER: atenelol                                You may not have any metal on your body including hair pins and              piercings  Do not wear jewelry, make-up, lotions, powders or perfumes, deodorant             Do not wear nail polish.  Do not shave  48 hours prior to surgery.     Do not bring valuables to the hospital. [facility] IS NOT             RESPONSIBLE   FOR VALUABLES.  Contacts, dentures or bridgework may not be worn into surgery.  Leave suitcase in the car. After surgery it may be brought to your room.                 Please read over the following fact sheets you were given: _____________________________________________________________________             [facility] - Preparing for Surgery Before surgery, you can play an important role.  Because skin is not sterile, your skin needs to be as free of germs as possible.  You can reduce the number of germs on your skin by washing with CHG (chlorahexidine gluconate) soap before surgery.  CHG is an antiseptic cleaner which kills germs and bonds with the skin to continue killing germs even after washing. Please DO NOT use if you have an allergy to CHG or antibacterial soaps.  If your skin becomes reddened/irritated stop using the CHG and inform your nurse when you arrive at Short Stay. Do not shave (including legs and underarms) for at least 48 hours prior to the  first CHG shower.  You may shave your face/neck. Please follow these instructions carefully:  1.  Shower with CHG Soap the night before surgery and the  morning of Surgery.  2.  If you choose to wash your hair, wash your hair first as usual with your  normal  shampoo.  3.  After you shampoo, rinse your hair and body thoroughly to remove the  shampoo.                           4.  Use CHG as you would any other liquid soap.  You can apply chg directly  to the skin and wash                       Gently with a scrungie or clean washcloth.  5.  Apply the CHG  Soap to your body ONLY FROM THE NECK DOWN.   Do not use on face/ open                           Wound or open sores. Avoid contact with eyes, ears mouth and genitals (private parts).                       Wash face,  Genitals (private parts) with your normal soap.             6.  Wash thoroughly, paying special attention to the area where your surgery  will be performed.  7.  Thoroughly rinse your body with warm water from the neck down.  8.  DO NOT shower/wash with your normal soap after using and rinsing off  the CHG Soap.                9.  Pat yourself dry with a clean towel.            10.  Wear clean pajamas.            11.  Place clean sheets on your bed the night of your first shower and do not  sleep with pets. Day of Surgery : Do not apply any lotions/deodorants the morning of surgery.  Please wear clean clothes to the hospital/surgery center.  FAILURE TO FOLLOW THESE INSTRUCTIONS MAY RESULT IN THE CANCELLATION OF YOUR SURGERY PATIENT SIGNATURE_________________________________  NURSE SIGNATURE__________________________________  ________________________________________________________________________  WHAT IS A BLOOD TRANSFUSION? Blood Transfusion Information  A transfusion is the replacement of blood or some of its parts. Blood is made up of multiple cells which provide different functions.  Red blood cells carry oxygen and are  used for blood loss replacement.  White blood cells fight against infection.  Platelets control bleeding.  Plasma helps clot blood.  Other blood products are available for specialized needs, such as hemophilia or other clotting disorders. BEFORE THE TRANSFUSION  Who gives blood for transfusions?   Healthy volunteers who are fully evaluated to make sure their blood is safe. This is blood bank blood. Transfusion therapy is the safest it has ever been in the practice of medicine. Before blood is taken from a donor, a complete history is taken to make sure that person has no history of diseases nor engages in risky social behavior (examples are intravenous drug use or sexual activity with multiple partners). The donor's travel history is screened to minimize risk of transmitting infections, such as malaria. The donated blood is tested for signs of infectious diseases, such as HIV and hepatitis. The blood is then tested to be sure it is compatible with you in order to minimize the chance of a transfusion reaction. If you or a relative donates blood, this is often done in anticipation of surgery and is not appropriate for emergency situations. It takes many days to process the donated blood. RISKS AND COMPLICATIONS Although transfusion therapy is very safe and saves many lives, the main dangers of transfusion include:   Getting an infectious disease.  Developing a transfusion reaction. This is an allergic reaction to something in the blood you were given. Every precaution is taken to prevent this. The decision to have a blood transfusion has been considered carefully by your caregiver before blood is given. Blood is not given unless the benefits outweigh the risks. AFTER THE TRANSFUSION  Right after receiving a blood transfusion, you  will usually feel much better and more energetic. This is especially true if your red blood cells have gotten low (anemic). The transfusion raises the level of the red  blood cells which carry oxygen, and this usually causes an energy increase.  The nurse administering the transfusion will monitor you carefully for complications. HOME CARE INSTRUCTIONS  No special instructions are needed after a transfusion. You may find your energy is better. Speak with your caregiver about any limitations on activity for underlying diseases you may have. SEEK MEDICAL CARE IF:   Your condition is not improving after your transfusion.  You develop redness or irritation at the intravenous (IV) site. SEEK IMMEDIATE MEDICAL CARE IF:  Any of the following symptoms occur over the next 12 hours:  Shaking chills.  You have a temperature by mouth above 102 F (38.9 C), not controlled by medicine.  Chest, back, or muscle pain.  People around you feel you are not acting correctly or are confused.  Shortness of breath or difficulty breathing.  Dizziness and fainting.  You get a rash or develop hives.  You have a decrease in urine output.  Your urine turns a dark color or changes to pink, red, or brown. Any of the following symptoms occur over the next 10 days:  You have a temperature by mouth above 102 F (38.9 C), not controlled by medicine.  Shortness of breath.  Weakness after normal activity.  The white part of the eye turns yellow (jaundice).  You have a decrease in the amount of urine or are urinating less often.  Your urine turns a dark color or changes to pink, red, or brown. Document Released: 02/11/2000 Document Revised: 05/08/2011 Document Reviewed: 09/30/2007 ExitCare Patient Information 2014 [place].  _______________________________________________________________________  Incentive Spirometer  An incentive spirometer is a tool that can help keep your lungs clear and active. This tool measures how well you are filling your lungs with each breath. Taking long deep breaths may help reverse or decrease the chance of developing breathing  (pulmonary) problems (especially infection) following:  A long period of time when you are unable to move or be active. BEFORE THE PROCEDURE   If the spirometer includes an indicator to show your best effort, your nurse or respiratory therapist will set it to a desired goal.  If possible, sit up straight or lean slightly forward. Try not to slouch.  Hold the incentive spirometer in an upright position. INSTRUCTIONS FOR USE  1. Sit on the edge of your bed if possible, or sit up as far as you can in bed or on a chair. 2. Hold the incentive spirometer in an upright position. 3. Breathe out normally. 4. Place the mouthpiece in your mouth and seal your lips tightly around it. 5. Breathe in slowly and as deeply as possible, raising the piston or the ball toward the top of the column. 6. Hold your breath for 3-5 seconds or for as long as possible. Allow the piston or ball to fall to the bottom of the column. 7. Remove the mouthpiece from your mouth and breathe out normally. 8. Rest for a few seconds and repeat Steps 1 through 7 at least 10 times every 1-2 hours when you are awake. Take your time and take a few normal breaths between deep breaths. 9. The spirometer may include an indicator to show your best effort. Use the indicator as a goal to work toward during each repetition. 10. After each set of 10 deep breaths, practice  coughing to be sure your lungs are clear. If you have an incision (the cut made at the time of surgery), support your incision when coughing by placing a pillow or rolled up towels firmly against it. Once you are able to get out of bed, walk around indoors and cough well. You may stop using the incentive spirometer when instructed by your caregiver.  RISKS AND COMPLICATIONS  Take your time so you do not get dizzy or light-headed.  If you are in pain, you may need to take or ask for pain medication before doing incentive spirometry. It is harder to take a deep breath if you  are having pain. AFTER USE  Rest and breathe slowly and easily.  It can be helpful to keep track of a log of your progress. Your caregiver can provide you with a simple table to help with this. If you are using the spirometer at home, follow these instructions: [place] IF:   You are having difficultly using the spirometer.  You have trouble using the spirometer as often as instructed.  Your pain medication is not giving enough relief while using the spirometer.  You develop fever of 100.5 F (38.1 C) or higher. SEEK IMMEDIATE MEDICAL CARE IF:   You cough up bloody sputum that had not been present before.  You develop fever of 102 F (38.9 C) or greater.  You develop worsening pain at or near the incision site. MAKE SURE YOU:   Understand these instructions.  Will watch your condition.  Will get help right away if you are not doing well or get worse. Document Released: 06/26/2006 Document Revised: 05/08/2011 Document Reviewed: 08/27/2006 [facility] Patient Information 2014 [place], [place].   ________________________________________________________________________

## 2018-05-14 ENCOUNTER — Encounter (HOSPITAL_COMMUNITY): Payer: Self-pay

## 2018-05-14 ENCOUNTER — Other Ambulatory Visit: Payer: Self-pay

## 2018-05-14 ENCOUNTER — Encounter: Payer: Self-pay | Admitting: Family Medicine

## 2018-05-14 ENCOUNTER — Encounter (HOSPITAL_COMMUNITY)
Admission: RE | Admit: 2018-05-14 | Discharge: 2018-05-14 | Disposition: A | Discharge status: Home / Self Care | Payer: Medicare Other | Source: Ambulatory Visit | Attending: Orthopedic Surgery | Admitting: Orthopedic Surgery

## 2018-05-14 DIAGNOSIS — Z01812 Encounter for preprocedural laboratory examination: Secondary | ICD-10-CM | POA: Insufficient documentation

## 2018-05-14 HISTORY — DX: Other complications of anesthesia, initial encounter: T88.59XA

## 2018-05-14 HISTORY — DX: Other specified postprocedural states: R11.2

## 2018-05-14 HISTORY — DX: Adverse effect of unspecified anesthetic, initial encounter: T41.45XA

## 2018-05-14 HISTORY — DX: Other specified postprocedural states: Z98.890

## 2018-05-14 LAB — COMPREHENSIVE METABOLIC PANEL
ALT: 39 U/L (ref 0–44)
ANION GAP: 8 (ref 5–15)
AST: 32 U/L (ref 15–41)
Albumin: 4.6 g/dL (ref 3.5–5.0)
Alkaline Phosphatase: 46 U/L (ref 38–126)
BUN: 19 mg/dL (ref 8–23)
CO2: 26 mmol/L (ref 22–32)
Calcium: 9.3 mg/dL (ref 8.9–10.3)
Chloride: 105 mmol/L (ref 98–111)
Creatinine, Ser: 0.6 mg/dL (ref 0.44–1.00)
GFR calc Af Amer: >60 mL/min (ref >60)
GFR calc non Af Amer: >60 mL/min (ref >60)
GLUCOSE: 109 mg/dL — AB (ref 70–99)
Potassium: 4.7 mmol/L (ref 3.5–5.1)
Sodium: 139 mmol/L (ref 135–145)
Total Bilirubin: 0.8 mg/dL (ref 0.3–1.2)
Total Protein: 8.1 g/dL (ref 6.5–8.1)

## 2018-05-14 LAB — CBC
HCT: 40.3 % (ref 36.0–46.0)
Hemoglobin: 12.8 g/dL (ref 12.0–15.0)
MCH: 27.2 pg (ref 26.0–34.0)
MCHC: 31.8 g/dL (ref 30.0–36.0)
MCV: 85.6 fL (ref 80.0–100.0)
PLATELETS: 177 10*3/uL (ref 150–400)
RBC: 4.71 MIL/uL (ref 3.87–5.11)
RDW: 13.5 % (ref 11.5–15.5)
WBC: 6.6 10*3/uL (ref 4.0–10.5)
nRBC: 0 % (ref 0.0–0.2)

## 2018-05-14 LAB — PROTIME-INR
INR: 0.9 (ref 0.8–1.2)
Prothrombin Time: 12.5 seconds (ref 11.4–15.2)

## 2018-05-14 LAB — APTT: aPTT: 28 seconds (ref 24–36)

## 2018-05-14 LAB — ABO/RH: ABO/RH(D): AB POS

## 2018-05-14 LAB — SURGICAL PCR SCREEN
MRSA, PCR: NEGATIVE
Staphylococcus aureus: NEGATIVE

## 2018-05-14 NOTE — Progress Notes (Signed)
Clearance 05-01-18 epic Dr. Hassan Rowan  EKG 03-15-18 epic  cxr 03-15-18 epic

## 2018-05-20 ENCOUNTER — Inpatient Hospital Stay (HOSPITAL_COMMUNITY)
Admission: RE | Admit: 2018-05-20 | Payer: Medicare Other | Source: Other Acute Inpatient Hospital | Admitting: Orthopedic Surgery

## 2018-05-20 ENCOUNTER — Encounter (HOSPITAL_COMMUNITY): Admission: RE | Payer: Self-pay | Source: Other Acute Inpatient Hospital

## 2018-05-20 LAB — TYPE AND SCREEN
ABO/RH(D): AB POS
ANTIBODY SCREEN: NEGATIVE

## 2018-05-20 SURGERY — ARTHROPLASTY, KNEE, TOTAL
Anesthesia: Choice | Laterality: Left

## 2018-05-27 DIAGNOSIS — M1712 Unilateral primary osteoarthritis, left knee: Secondary | ICD-10-CM | POA: Diagnosis not present

## 2018-06-04 DIAGNOSIS — M1712 Unilateral primary osteoarthritis, left knee: Secondary | ICD-10-CM | POA: Diagnosis not present

## 2018-06-11 DIAGNOSIS — M1712 Unilateral primary osteoarthritis, left knee: Secondary | ICD-10-CM | POA: Diagnosis not present

## 2018-07-24 NOTE — H&P (Signed)
TOTAL KNEE ADMISSION H&P  Patient is being admitted for left total knee arthroplasty.  Subjective:  Chief Complaint:left knee pain.  HPI: Cassidy Bennett, 78 y.o. female, has a history of pain and functional disability in the left knee due to arthritis and has failed non-surgical conservative treatments for greater than 12 weeks to includecorticosteriod injections, viscosupplementation injections and activity modification.  Onset of symptoms was gradual, starting several years ago with gradually worsening course since that time. The patient noted prior procedures on the knee to include  arthroscopy on the left knee(s).  Patient currently rates pain in the left knee(s) at 6 out of 10 with activity. Patient has worsening of pain with activity and weight bearing, pain that interferes with activities of daily living and crepitus.  Patient has evidence of essentially bone-on-bone within the medial compartment with significant patellofemoral narrowing as well by imaging studies. There is no active infection.  Patient Active Problem List   Diagnosis Date Noted  . Closed displaced fracture of proximal phalanx of right great toe 01/03/2017  . Rotator cuff syndrome of right shoulder 01/03/2017  . Hyperlipidemia 09/05/2016  . Essential hypertension 08/27/2015  . Urine abnormality 03/29/2015  . Routine general medical examination at a health care facility 06/26/2013  . History of TB (tuberculosis) 06/17/2012  . ALLERGIC RHINITIS 01/22/2008  . Osteoporosis 12/13/2006  . Osteoarthritis 10/11/2006   Past Medical History:  Diagnosis Date  . Allergy   . Complication of anesthesia   . History of TB (tuberculosis)    treated 45 plus years ago last cxr 03-15-18  . Hypercholesteremia   . Hypertension   . Osteoarthritis   . PONV (postoperative nausea and vomiting)   . Post-menopausal     Past Surgical History:  Procedure Laterality Date  . CATARACT EXTRACTION  2018   right eye  . DILATION AND  CURETTAGE OF UTERUS  2000  . KNEE ARTHROSCOPY  2014   left  . LUMBAR DISC SURGERY    . SHOULDER SURGERY     right    No current facility-administered medications for this encounter.    Current Outpatient Medications  Medication Sig Dispense Refill Last Dose  . Ascorbic Acid (VITAMIN C PO) Take by mouth.   Taking  . aspirin 81 MG chewable tablet Chew by mouth daily.   Taking  . atenolol (TENORMIN) 25 MG tablet Take 1 tablet (25 mg total) by mouth daily. 90 tablet 1 Taking  . atenolol (TENORMIN) 25 MG tablet Take 25 mg by mouth daily.     . Calcium Carb-Cholecalciferol (CALCIUM 600 + D PO) Take 1 tablet by mouth daily.     Marland Kitchen CALCIUM PO Take 1 tablet by mouth daily.   Taking  . Cholecalciferol (VITAMIN D PO) Take by mouth.   Taking  . Ergocalciferol (VITAMIN D2) 10 MCG (400 UNIT) TABS Take 400 Units by mouth daily.     Marland Kitchen ibuprofen (ADVIL,MOTRIN) 200 MG tablet Take 200 mg by mouth every 6 (six) hours as needed.   Taking  . ibuprofen (ADVIL,MOTRIN) 200 MG tablet Take 400 mg by mouth every 6 (six) hours as needed for moderate pain.     Marland Kitchen loratadine (CLARITIN) 10 MG tablet Take 10 mg by mouth daily as needed.    Taking  . loratadine (CLARITIN) 10 MG tablet Take 10 mg by mouth daily as needed for allergies.     . magnesium 30 MG tablet Take 30 mg by mouth 2 (two) times daily.   Taking  .  Multiple Vitamins-Calcium (ONE-A-DAY WOMENS PO) Take 1 tablet by mouth daily.   Taking  . naproxen (NAPROSYN) 500 MG tablet TAKE ONE TABLET BY MOUTH TWICE DAILY with a meal 60 tablet 1 Taking  . NON FORMULARY Vitamin B   Taking  . polyethylene glycol (MIRALAX / GLYCOLAX) packet Take 17 g by mouth daily as needed.   Taking  . vitamin A 1610925000 UNIT capsule Take 25,000 Units by mouth daily.     Marland Kitchen. VITAMIN A PO Take by mouth.   Taking  . vitamin B-12 (CYANOCOBALAMIN) 1000 MCG tablet Take 1,000 mcg by mouth daily.     . vitamin E 400 UNIT capsule Take 400 Units by mouth daily.     Marland Kitchen. VITAMIN E PO Take by mouth.    Taking   No Known Allergies  Social History   Tobacco Use  . Smoking status: Never Smoker  . Smokeless tobacco: Never Used  Substance Use Topics  . Alcohol use: Yes    Alcohol/week: 5.0 standard drinks    Types: 5 Glasses of wine per week    Family History  Problem Relation Age of Onset  . Other Father        malaria  . Cancer Other        stomach     Review of Systems  Constitutional: Negative for chills and fever.  HENT: Negative for congestion, sore throat and tinnitus.   Eyes: Negative for double vision, photophobia and pain.  Respiratory: Negative for cough, shortness of breath and wheezing.   Cardiovascular: Negative for chest pain, palpitations and orthopnea.  Gastrointestinal: Negative for heartburn, nausea and vomiting.  Genitourinary: Negative for dysuria, frequency and urgency.  Musculoskeletal: Positive for joint pain.  Neurological: Negative for dizziness, weakness and headaches.    Objective:  Physical Exam  Well nourished and well developed.  General: Alert and oriented x3, cooperative and pleasant, no acute distress.  Head: normocephalic, atraumatic, neck supple.  Eyes: EOMI.  Respiratory: breath sounds clear in all fields, no wheezing, rales, or rhonchi. Cardiovascular: Regular rate and rhythm, no murmurs, gallops or rubs.  Abdomen: non-tender to palpation and soft, normoactive bowel sounds. Musculoskeletal:  Left Knee Exam: Minimal varus deformity. No effusion. No Swelling.Range of motion is 0-130 degrees. No crepitus on range of motion of the knee. Positive medial joint line tendernessNo lateral joint line tenderness. Stable knee.  Calves soft and nontender. Motor function intact in LE. Strength 5/5 LE bilaterally. Neuro: Distal pulses 2+. Sensation to light touch intact in LE.  Vital signs in last 24 hours: Blood pressure: 160/92 mmHg  Labs:   Estimated body mass index is 21.22 kg/m as calculated from the following:   Height as of 05/14/18:  5\' 2"  (1.575 m).   Weight as of 05/14/18: 52.6 kg.   Imaging Review Plain radiographs demonstrate severe degenerative joint disease of the left knee(s). The overall alignment isneutral. The bone quality appears to be adequate for age and reported activity level.      Assessment/Plan:  End stage arthritis, left knee   The patient history, physical examination, clinical judgment of the provider and imaging studies are consistent with end stage degenerative joint disease of the left knee(s) and total knee arthroplasty is deemed medically necessary. The treatment options including medical management, injection therapy arthroscopy and arthroplasty were discussed at length. The risks and benefits of total knee arthroplasty were presented and reviewed. The risks due to aseptic loosening, infection, stiffness, patella tracking problems, thromboembolic complications and other imponderables  were discussed. The patient acknowledged the explanation, agreed to proceed with the plan and consent was signed. Patient is being admitted for inpatient treatment for surgery, pain control, PT, OT, prophylactic antibiotics, VTE prophylaxis, progressive ambulation and ADL's and discharge planning. The patient is planning to be discharged home.    Anticipated LOS equal to or greater than 2 midnights due to - Age 25 and older with one or more of the following:  - Obesity  - Expected need for hospital services (PT, OT, Nursing) required for safe  discharge  - Anticipated need for postoperative skilled nursing care or inpatient rehab  - Active co-morbidities: None OR   - Unanticipated findings during/Post Surgery: None  - Patient is a high risk of re-admission due to: None   Therapy Plans: outpatient therapy at [facility] in [place] Disposition: Home with spouse Planned DVT Prophylaxis: Aspirin 325 mg BID DME needed: Dan Humphreys, 3-in-1 PCP: Theodis Shove, MD TXA: IV Allergies: NKDA Anesthesia Concerns: Nausea/vomiting  BMI: 21.7  - Patient was instructed on what medications to stop prior to surgery. - Follow-up visit in 2 weeks with Dr. Lequita Halt - Begin physical therapy following surgery - Pre-operative lab work as pre-surgical testing - Prescriptions will be provided in hospital at time of discharge  Arther Abbott, PA-C Orthopedic Surgery [facility] [place]

## 2018-08-02 ENCOUNTER — Ambulatory Visit: Payer: Medicare Other | Admitting: Family Medicine

## 2018-08-06 ENCOUNTER — Encounter (HOSPITAL_COMMUNITY): Payer: Self-pay

## 2018-08-06 NOTE — Progress Notes (Signed)
Medical clearance 05/01/2018 Dr. Ethlyn Gallery in epic.

## 2018-08-06 NOTE — Patient Instructions (Addendum)
DUE TO COVID-19 NO VISITORS ARE ALLOWED IN THE HOSPITAL AT THIS TIME          (Must self quarantine after testing. Follow instructions on handout.)  Pt. Had Covid-19 test 08-07-18   Your procedure is scheduled on: Monday, August 12, 2018     Report to [facility] Main  Entrance    Report to admitting at 10:25 AM   Call this number if you have problems the morning of surgery [phone]   Do not eat food:After Midnight.   May have liquids until 4:30AM day of surgery   CLEAR LIQUID DIET  Foods Allowed                                                                     Foods Excluded  Water, Black Coffee and tea, regular and decaf                             liquids that you cannot  Plain Jell-O in any flavor                                             see through such as: Fruit ices (not with fruit pulp)                                     milk, soups, orange juice  Iced Popsicles                                    All solid food Carbonated beverages, regular and diet                                    Cranberry, grape and apple juices Sports drinks like Gatorade Lightly seasoned clear broth or consume(fat free) Sugar, honey syrup  Sample Menu Breakfast                                Lunch                                     Supper Cranberry juice                    Beef broth                            Chicken broth Jell-O                                     Grape juice  Apple juice Coffee or tea                        Jell-O                                      Popsicle                                                Coffee or tea                        Coffee or tea   Complete one Ensure drink the morning of surgery at 4:30AM the day of surgery.   Brush your teeth the morning of surgery.   Do NOT smoke after Midnight   Take these medicines the morning of surgery with A SIP OF WATER: Atenolol                               You may not have  any metal on your body including hair pins, jewelry, and body piercings             Do not wear make-up, lotions, powders, perfumes/cologne, or deodorant             Do not wear nail polish.  Do not shave  48 hours prior to surgery.                Do not bring valuables to the hospital. [facility] IS NOT             RESPONSIBLE   FOR VALUABLES.   Contacts, dentures or bridgework may not be worn into surgery.   Bring small overnight bag day of surgery.    Special Instructions: Bring a copy of your healthcare power of attorney and living will documents         the day of surgery if you haven't scanned them in before.              Please read over the following fact sheets you were given:  [facility] - Preparing for Surgery Before surgery, you can play an important role.  Because skin is not sterile, your skin needs to be as free of germs as possible.  You can reduce the number of germs on your skin by washing with CHG (chlorahexidine gluconate) soap before surgery.  CHG is an antiseptic cleaner which kills germs and bonds with the skin to continue killing germs even after washing. Please DO NOT use if you have an allergy to CHG or antibacterial soaps.  If your skin becomes reddened/irritated stop using the CHG and inform your nurse when you arrive at Short Stay. Do not shave (including legs and underarms) for at least 48 hours prior to the first CHG shower.  You may shave your face/neck.  Please follow these instructions carefully:  1.  Shower with CHG Soap the night before surgery and the  morning of surgery.  2.  If you choose to wash your hair, wash your hair first as usual with your normal  shampoo.  3.  After you shampoo, rinse your hair and body thoroughly to remove the shampoo.  4.  Use CHG as you would any other liquid soap.  You can apply chg directly to the skin and wash.  Gently with a scrungie or clean washcloth.  5.  Apply the CHG Soap to your body  ONLY FROM THE NECK DOWN.   Do   not use on face/ open                           Wound or open sores. Avoid contact with eyes, ears mouth and   genitals (private parts).                       Wash face,  Genitals (private parts) with your normal soap.             6.  Wash thoroughly, paying special attention to the area where your    surgery  will be performed.  7.  Thoroughly rinse your body with warm water from the neck down.  8.  DO NOT shower/wash with your normal soap after using and rinsing off the CHG Soap.                9.  Pat yourself dry with a clean towel.            10.  Wear clean pajamas.            11.  Place clean sheets on your bed the night of your first shower and do not  sleep with pets. Day of Surgery : Do not apply any lotions/deodorants the morning of surgery.  Please wear clean clothes to the hospital/surgery center.  FAILURE TO FOLLOW THESE INSTRUCTIONS MAY RESULT IN THE CANCELLATION OF YOUR SURGERY  PATIENT SIGNATURE_________________________________  NURSE SIGNATURE__________________________________  ________________________________________________________________________   Cassidy Bennett  An incentive spirometer is a tool that can help keep your lungs clear and active. This tool measures how well you are filling your lungs with each breath. Taking long deep breaths may help reverse or decrease the chance of developing breathing (pulmonary) problems (especially infection) following:  A long period of time when you are unable to move or be active. BEFORE THE PROCEDURE   If the spirometer includes an indicator to show your best effort, your nurse or respiratory therapist will set it to a desired goal.  If possible, sit up straight or lean slightly forward. Try not to slouch.  Hold the incentive spirometer in an upright position. INSTRUCTIONS FOR USE  1. Sit on the edge of your bed if possible, or sit up as far as you can in bed or on a chair. 2. Hold  the incentive spirometer in an upright position. 3. Breathe out normally. 4. Place the mouthpiece in your mouth and seal your lips tightly around it. 5. Breathe in slowly and as deeply as possible, raising the piston or the ball toward the top of the column. 6. Hold your breath for 3-5 seconds or for as long as possible. Allow the piston or ball to fall to the bottom of the column. 7. Remove the mouthpiece from your mouth and breathe out normally. 8. Rest for a few seconds and repeat Steps 1 through 7 at least 10 times every 1-2 hours when you are awake. Take your time and take a few normal breaths between deep breaths. 9. The spirometer may include an indicator to show your best effort. Use the indicator as a goal to work toward during each  repetition. 10. After each set of 10 deep breaths, practice coughing to be sure your lungs are clear. If you have an incision (the cut made at the time of surgery), support your incision when coughing by placing a pillow or rolled up towels firmly against it. Once you are able to get out of bed, walk around indoors and cough well. You may stop using the incentive spirometer when instructed by your caregiver.  RISKS AND COMPLICATIONS  Take your time so you do not get dizzy or light-headed.  If you are in pain, you may need to take or ask for pain medication before doing incentive spirometry. It is harder to take a deep breath if you are having pain. AFTER USE  Rest and breathe slowly and easily.  It can be helpful to keep track of a log of your progress. Your caregiver can provide you with a simple table to help with this. If you are using the spirometer at home, follow these instructions: [place] IF:   You are having difficultly using the spirometer.  You have trouble using the spirometer as often as instructed.  Your pain medication is not giving enough relief while using the spirometer.  You develop fever of 100.5 F (38.1 C) or  higher. SEEK IMMEDIATE MEDICAL CARE IF:   You cough up bloody sputum that had not been present before.  You develop fever of 102 F (38.9 C) or greater.  You develop worsening pain at or near the incision site. MAKE SURE YOU:   Understand these instructions.  Will watch your condition.  Will get help right away if you are not doing well or get worse. Document Released: 06/26/2006 Document Revised: 05/08/2011 Document Reviewed: 08/27/2006 [employer] Patient Information 2014 [employer], [place].   ________________________________________________________________________  WHAT IS A BLOOD TRANSFUSION? Blood Transfusion Information  A transfusion is the replacement of blood or some of its parts. Blood is made up of multiple cells which provide different functions.  Red blood cells carry oxygen and are used for blood loss replacement.  White blood cells fight against infection.  Platelets control bleeding.  Plasma helps clot blood.  Other blood products are available for specialized needs, such as hemophilia or other clotting disorders. BEFORE THE TRANSFUSION  Who gives blood for transfusions?   Healthy volunteers who are fully evaluated to make sure their blood is safe. This is blood bank blood. Transfusion therapy is the safest it has ever been in the practice of medicine. Before blood is taken from a donor, a complete history is taken to make sure that person has no history of diseases nor engages in risky social behavior (examples are intravenous drug use or sexual activity with multiple partners). The donor's travel history is screened to minimize risk of transmitting infections, such as malaria. The donated blood is tested for signs of infectious diseases, such as HIV and hepatitis. The blood is then tested to be sure it is compatible with you in order to minimize the chance of a transfusion reaction. If you or a relative donates blood, this is often done in anticipation of surgery  and is not appropriate for emergency situations. It takes many days to process the donated blood. RISKS AND COMPLICATIONS Although transfusion therapy is very safe and saves many lives, the main dangers of transfusion include:   Getting an infectious disease.  Developing a transfusion reaction. This is an allergic reaction to something in the blood you were given. Every precaution is taken to prevent this.  The decision to have a blood transfusion has been considered carefully by your caregiver before blood is given. Blood is not given unless the benefits outweigh the risks. AFTER THE TRANSFUSION  Right after receiving a blood transfusion, you will usually feel much better and more energetic. This is especially true if your red blood cells have gotten low (anemic). The transfusion raises the level of the red blood cells which carry oxygen, and this usually causes an energy increase.  The nurse administering the transfusion will monitor you carefully for complications. HOME CARE INSTRUCTIONS  No special instructions are needed after a transfusion. You may find your energy is better. Speak with your caregiver about any limitations on activity for underlying diseases you may have. SEEK MEDICAL CARE IF:   Your condition is not improving after your transfusion.  You develop redness or irritation at the intravenous (IV) site. SEEK IMMEDIATE MEDICAL CARE IF:  Any of the following symptoms occur over the next 12 hours:  Shaking chills.  You have a temperature by mouth above 102 F (38.9 C), not controlled by medicine.  Chest, back, or muscle pain.  People around you feel you are not acting correctly or are confused.  Shortness of breath or difficulty breathing.  Dizziness and fainting.  You get a rash or develop hives.  You have a decrease in urine output.  Your urine turns a dark color or changes to pink, red, or brown. Any of the following symptoms occur over the next 10  days:  You have a temperature by mouth above 102 F (38.9 C), not controlled by medicine.  Shortness of breath.  Weakness after normal activity.  The white part of the eye turns yellow (jaundice).  You have a decrease in the amount of urine or are urinating less often.  Your urine turns a dark color or changes to pink, red, or brown. Document Released: 02/11/2000 Document Revised: 05/08/2011 Document Reviewed: 09/30/2007 [facility] Patient Information 2014 [place], [place].  _______________________________________________________________________

## 2018-08-07 ENCOUNTER — Encounter (HOSPITAL_COMMUNITY)
Admission: RE | Admit: 2018-08-07 | Discharge: 2018-08-07 | Disposition: A | Discharge status: Home / Self Care | Payer: Medicare Other | Source: Ambulatory Visit | Attending: Orthopedic Surgery | Admitting: Orthopedic Surgery

## 2018-08-07 ENCOUNTER — Other Ambulatory Visit: Payer: Self-pay

## 2018-08-07 ENCOUNTER — Other Ambulatory Visit (HOSPITAL_COMMUNITY)
Admission: RE | Admit: 2018-08-07 | Discharge: 2018-08-07 | Disposition: A | Discharge status: Home / Self Care | Payer: Medicare Other | Source: Ambulatory Visit | Attending: Orthopedic Surgery | Admitting: Orthopedic Surgery

## 2018-08-07 ENCOUNTER — Encounter (HOSPITAL_COMMUNITY): Payer: Self-pay

## 2018-08-07 DIAGNOSIS — M1712 Unilateral primary osteoarthritis, left knee: Secondary | ICD-10-CM | POA: Diagnosis not present

## 2018-08-07 DIAGNOSIS — Z1159 Encounter for screening for other viral diseases: Secondary | ICD-10-CM | POA: Diagnosis not present

## 2018-08-07 DIAGNOSIS — Z01812 Encounter for preprocedural laboratory examination: Secondary | ICD-10-CM | POA: Insufficient documentation

## 2018-08-07 HISTORY — DX: Respiratory tuberculosis unspecified: A15.9

## 2018-08-07 HISTORY — DX: Age-related osteoporosis without current pathological fracture: M81.0

## 2018-08-07 LAB — COMPREHENSIVE METABOLIC PANEL
ALT: 24 U/L (ref 0–44)
AST: 25 U/L (ref 15–41)
Albumin: 4.5 g/dL (ref 3.5–5.0)
Alkaline Phosphatase: 41 U/L (ref 38–126)
Anion gap: 8 (ref 5–15)
BUN: 13 mg/dL (ref 8–23)
CO2: 26 mmol/L (ref 22–32)
Calcium: 9.2 mg/dL (ref 8.9–10.3)
Chloride: 105 mmol/L (ref 98–111)
Creatinine, Ser: 0.54 mg/dL (ref 0.44–1.00)
GFR calc Af Amer: >60 mL/min (ref >60)
GFR calc non Af Amer: >60 mL/min (ref >60)
Glucose, Bld: 103 mg/dL — ABNORMAL HIGH (ref 70–99)
Potassium: 4.1 mmol/L (ref 3.5–5.1)
Sodium: 139 mmol/L (ref 135–145)
Total Bilirubin: 0.8 mg/dL (ref 0.3–1.2)
Total Protein: 8 g/dL (ref 6.5–8.1)

## 2018-08-07 LAB — PROTIME-INR
INR: 0.9 (ref 0.8–1.2)
Prothrombin Time: 12 seconds (ref 11.4–15.2)

## 2018-08-07 LAB — CBC
HCT: 43.9 % (ref 36.0–46.0)
Hemoglobin: 13.9 g/dL (ref 12.0–15.0)
MCH: 27.1 pg (ref 26.0–34.0)
MCHC: 31.7 g/dL (ref 30.0–36.0)
MCV: 85.6 fL (ref 80.0–100.0)
Platelets: 194 10*3/uL (ref 150–400)
RBC: 5.13 MIL/uL — ABNORMAL HIGH (ref 3.87–5.11)
RDW: 14 % (ref 11.5–15.5)
WBC: 5.4 10*3/uL (ref 4.0–10.5)
nRBC: 0 % (ref 0.0–0.2)

## 2018-08-07 LAB — SURGICAL PCR SCREEN
MRSA, PCR: NEGATIVE
Staphylococcus aureus: NEGATIVE

## 2018-08-07 LAB — APTT: aPTT: 27 seconds (ref 24–36)

## 2018-08-07 NOTE — Progress Notes (Signed)
ekg 03-15-18 epic cxr 03-15-18 epic

## 2018-08-08 LAB — NOVEL CORONAVIRUS, NAA (HOSP ORDER, SEND-OUT TO REF LAB; TAT 18-24 HRS): SARS-CoV-2, NAA: NOT DETECTED

## 2018-08-08 NOTE — Anesthesia Preprocedure Evaluation (Addendum)
Anesthesia Evaluation  Patient identified by MRN, date of birth, ID band Patient awake    Reviewed: Allergy & Precautions, NPO status , Patient's Chart, lab work & pertinent test results  History of Anesthesia Complications (+) PONV and history of anesthetic complications  Airway Mallampati: II  TM Distance: >3 FB Neck ROM: Full    Dental  (+) Dental Advisory Given   Pulmonary   Hx TB s/p treatment ~45 years ago    breath sounds clear to auscultation       Cardiovascular hypertension, Pt. on medications and Pt. on home beta blockers  Rhythm:Regular Rate:Normal     Neuro/Psych negative neurological ROS  negative psych ROS   GI/Hepatic negative GI ROS, Neg liver ROS,   Endo/Other  negative endocrine ROS  Renal/GU negative Renal ROS     Musculoskeletal  (+) Arthritis ,   Abdominal   Peds  Hematology negative hematology ROS (+)   Anesthesia Other Findings   Reproductive/Obstetrics                           Anesthesia Physical Anesthesia Plan  ASA: II  Anesthesia Plan: Spinal   Post-op Pain Management:  Regional for Post-op pain   Induction:   PONV Risk Score and Plan: 3 and Treatment may vary due to age or medical condition and Propofol infusion  Airway Management Planned: Natural Airway and Simple Face Mask  Additional Equipment: None  Intra-op Plan:   Post-operative Plan:   Informed Consent: I have reviewed the patients History and Physical, chart, labs and discussed the procedure including the risks, benefits and alternatives for the proposed anesthesia with the patient or authorized representative who has indicated his/her understanding and acceptance.       Plan Discussed with: CRNA and Anesthesiologist  Anesthesia Plan Comments: (Labs reviewed, platelets acceptable. Discussed risks and benefits of spinal, including spinal/epidural hematoma, infection, failed block,  and PDPH. Patient expressed understanding and wished to proceed. )      Anesthesia Quick Evaluation

## 2018-08-08 NOTE — Progress Notes (Signed)
Anesthesia Chart Review   Case: [id number] Date/Time: 08/12/18 1240   Procedure: TOTAL KNEE ARTHROPLASTY (Left )   Anesthesia type: Choice   Pre-op diagnosis: Left knee osteoarthritis   Location: [facility] ROOM 10 / [facility]   Surgeon: Ollen GrossAluisio, Frank, MD      DISCUSSION: 78 yo never smoker with h/o PONV, HTN, left knee OA scheduled for above procedure.    Pt seen by PCP, Dr. Theodis ShoveJunell Koberlein, 05/01/2018 for preoperative evaluation.  Per OV note, "1. Preoperative workup as follows: baseline bloodwork; make sure no anemia prior to surgery and will check renal function (esp since taking increased anti-inflammatories) 2. Change in medication regimen before surgery: stop ASA 1 week prior 3. No contraindications to planned surgery"  Anticipate pt can proceed with planned procedure barring acute status change.  VS: BP (!) 180/93 (BP Location: Left Arm) Comment: patient had not taken BP med this morning  Pulse 66   Temp 36.6 C (Oral)   Resp 18   Ht 5\' 2"  (1.575 m)   Wt 53.1 kg   SpO2 100%   BMI 21.40 kg/m   PROVIDERS: Wynn BankerKoberlein, Junell C, MD is PCP    LABS: Labs reviewed: Acceptable for surgery. (all labs ordered are listed, but only abnormal results are displayed)  Labs Reviewed  CBC - Abnormal; Notable for the following components:      Result Value   RBC 5.13 (*)    All other components within normal limits  COMPREHENSIVE METABOLIC PANEL - Abnormal; Notable for the following components:   Glucose, Bld 103 (*)    All other components within normal limits  SURGICAL PCR SCREEN  APTT  PROTIME-INR  TYPE AND SCREEN     IMAGES: Chest Xray 03/15/2018 FINDINGS: There is mild bilateral chronic interstitial thickening. There is a calcified left lower lobe pulmonary nodule likely reflecting sequela prior granulomatous disease. There is no focal parenchymal opacity. There is no pleural effusion or pneumothorax. The heart and mediastinal contours are unremarkable.  The osseous structures  are unremarkable.  IMPRESSION: No active cardiopulmonary disease.  EKG: 03/15/2018 Rate 66 bpm Sinus rhythm  Left atrial enlargement Appears stable when compared with previous in 2017  CV:  Past Medical History:  Diagnosis Date  . Allergy   . Complication of anesthesia   . History of TB (tuberculosis)    treated 45 plus years ago last cxr 03-15-18  CXR every 5 years  . Hypercholesteremia    denies at preop  . Hypertension   . Osteoarthritis   . Osteoporosis   . PONV (postoperative nausea and vomiting)   . Post-menopausal   . Tuberculosis     Past Surgical History:  Procedure Laterality Date  . CATARACT EXTRACTION  2018   right eye  . COLONOSCOPY  2015  . DILATION AND CURETTAGE OF UTERUS  2000  . KNEE ARTHROSCOPY  2014   left  . LUMBAR DISC SURGERY    . SHOULDER SURGERY     right    MEDICATIONS: . Ascorbic Acid (VITAMIN C PO)  . atenolol (TENORMIN) 25 MG tablet  . b complex vitamins tablet  . Calcium Carb-Cholecalciferol (CALCIUM 600 + D PO)  . Cholecalciferol (VITAMIN D PO)  . ibuprofen (ADVIL,MOTRIN) 200 MG tablet  . loratadine (CLARITIN) 10 MG tablet  . magnesium 30 MG tablet  . naproxen (NAPROSYN) 500 MG tablet  . polyethylene glycol (MIRALAX / GLYCOLAX) packet  . Vitamin A 2400 MCG (8000 UT) TABS  . vitamin B-12 (CYANOCOBALAMIN) 1000 MCG  tablet  . vitamin E 400 UNIT capsule   No current facility-administered medications for this encounter.     Maia Plan [facility] Pre-Surgical Testing [phone] 08/08/18 10:10 AM

## 2018-08-09 NOTE — Progress Notes (Signed)
SPOKE W/  _christine Gauss Confirmed surgery time also     SCREENING SYMPTOMS OF COVID 19:   COUGH--no  RUNNY NOSE--- no  SORE THROAT---no  NASAL CONGESTION----no  SNEEZING----no  SHORTNESS OF BREATH---no  DIFFICULTY BREATHING---no  TEMP >100.0 -----no  UNEXPLAINED BODY ACHES------no  CHILLS -------- no  HEADACHES ---------no  LOSS OF SMELL/ TASTE --------no    HAVE YOU OR ANY FAMILY MEMBER TRAVELLED PAST 14 DAYS OUT OF THE   COUNTY---no STATE----no COUNTRY----no  HAVE YOU OR ANY FAMILY MEMBER BEEN EXPOSED TO ANYONE WITH COVID 19? no

## 2018-08-11 MED ORDER — BUPIVACAINE LIPOSOME 1.3 % IJ SUSP
20.0000 mL | INTRAMUSCULAR | Status: DC
  Filled 2018-08-11: qty 20

## 2018-08-12 ENCOUNTER — Inpatient Hospital Stay (HOSPITAL_COMMUNITY): Payer: Medicare Other | Admitting: Physician Assistant

## 2018-08-12 ENCOUNTER — Inpatient Hospital Stay (HOSPITAL_COMMUNITY)
Admission: RE | Admit: 2018-08-12 | Discharge: 2018-08-14 | DRG: 470 | Disposition: A | Discharge status: Home / Self Care | Payer: Medicare Other | Source: Other Acute Inpatient Hospital | Attending: Orthopedic Surgery | Admitting: Orthopedic Surgery

## 2018-08-12 ENCOUNTER — Inpatient Hospital Stay (HOSPITAL_COMMUNITY): Payer: Medicare Other | Admitting: Certified Registered Nurse Anesthetist

## 2018-08-12 ENCOUNTER — Other Ambulatory Visit: Payer: Self-pay

## 2018-08-12 ENCOUNTER — Encounter (HOSPITAL_COMMUNITY)
Admission: RE | Disposition: A | Discharge status: Home / Self Care | Payer: Self-pay | Source: Other Acute Inpatient Hospital | Attending: Orthopedic Surgery

## 2018-08-12 ENCOUNTER — Encounter (HOSPITAL_COMMUNITY): Payer: Self-pay | Admitting: *Deleted

## 2018-08-12 ENCOUNTER — Telehealth (HOSPITAL_COMMUNITY): Payer: Self-pay | Admitting: *Deleted

## 2018-08-12 DIAGNOSIS — M179 Osteoarthritis of knee, unspecified: Secondary | ICD-10-CM

## 2018-08-12 DIAGNOSIS — Z1159 Encounter for screening for other viral diseases: Secondary | ICD-10-CM | POA: Diagnosis not present

## 2018-08-12 DIAGNOSIS — G8918 Other acute postprocedural pain: Secondary | ICD-10-CM | POA: Diagnosis not present

## 2018-08-12 DIAGNOSIS — Z7982 Long term (current) use of aspirin: Secondary | ICD-10-CM | POA: Diagnosis not present

## 2018-08-12 DIAGNOSIS — Z8611 Personal history of tuberculosis: Secondary | ICD-10-CM

## 2018-08-12 DIAGNOSIS — Z791 Long term (current) use of non-steroidal anti-inflammatories (NSAID): Secondary | ICD-10-CM | POA: Diagnosis not present

## 2018-08-12 DIAGNOSIS — M1712 Unilateral primary osteoarthritis, left knee: Secondary | ICD-10-CM | POA: Diagnosis present

## 2018-08-12 DIAGNOSIS — M810 Age-related osteoporosis without current pathological fracture: Secondary | ICD-10-CM | POA: Diagnosis not present

## 2018-08-12 DIAGNOSIS — M1710 Unilateral primary osteoarthritis, unspecified knee: Secondary | ICD-10-CM | POA: Diagnosis present

## 2018-08-12 DIAGNOSIS — Z79899 Other long term (current) drug therapy: Secondary | ICD-10-CM | POA: Diagnosis not present

## 2018-08-12 DIAGNOSIS — M25762 Osteophyte, left knee: Secondary | ICD-10-CM | POA: Diagnosis present

## 2018-08-12 DIAGNOSIS — I10 Essential (primary) hypertension: Secondary | ICD-10-CM | POA: Diagnosis present

## 2018-08-12 HISTORY — PX: TOTAL KNEE ARTHROPLASTY: SHX125

## 2018-08-12 LAB — TYPE AND SCREEN
ABO/RH(D): AB POS
ABO/RH(D): AB POS
Antibody Screen: NEGATIVE
Antibody Screen: NEGATIVE

## 2018-08-12 SURGERY — ARTHROPLASTY, KNEE, TOTAL
Anesthesia: Spinal | Site: Knee | Laterality: Left

## 2018-08-12 MED ORDER — LORATADINE 10 MG PO TABS: 10.0000 mg | ORAL_TABLET | Freq: Every day | ORAL | Status: DC | PRN

## 2018-08-12 MED ORDER — ONDANSETRON HCL 4 MG/2ML IJ SOLN
INTRAMUSCULAR | Status: AC
  Filled 2018-08-12: qty 2

## 2018-08-12 MED ORDER — ROPIVACAINE HCL 7.5 MG/ML IJ SOLN
INTRAMUSCULAR | Status: DC | PRN
  Administered 2018-08-12: 20 mL via PERINEURAL

## 2018-08-12 MED ORDER — OXYCODONE HCL 5 MG/5ML PO SOLN: 5.0000 mg | Freq: Once | ORAL | Status: DC | PRN

## 2018-08-12 MED ORDER — PHENOL 1.4 % MT LIQD: 1.0000 | OROMUCOSAL | Status: DC | PRN

## 2018-08-12 MED ORDER — SODIUM CHLORIDE 0.9 % IR SOLN
Status: DC | PRN
  Administered 2018-08-12: 1000 mL

## 2018-08-12 MED ORDER — FENTANYL CITRATE (PF) 100 MCG/2ML IJ SOLN
50.0000 ug | INTRAMUSCULAR | Status: DC
  Administered 2018-08-12: 50 ug via INTRAVENOUS

## 2018-08-12 MED ORDER — METHOCARBAMOL 500 MG IVPB - SIMPLE MED
500.0000 mg | Freq: Four times a day (QID) | INTRAVENOUS | Status: DC | PRN
  Filled 2018-08-12: qty 50

## 2018-08-12 MED ORDER — OXYCODONE HCL 5 MG PO TABS
5.0000 mg | ORAL_TABLET | ORAL | Status: DC | PRN
  Administered 2018-08-12: 10 mg via ORAL
  Administered 2018-08-12: 5 mg via ORAL
  Filled 2018-08-12: qty 2
  Filled 2018-08-12: qty 1
  Filled 2018-08-12: qty 2

## 2018-08-12 MED ORDER — FENTANYL CITRATE (PF) 100 MCG/2ML IJ SOLN
INTRAMUSCULAR | Status: AC
  Administered 2018-08-12: 50 ug via INTRAVENOUS
  Filled 2018-08-12: qty 2

## 2018-08-12 MED ORDER — ATENOLOL 25 MG PO TABS
25.0000 mg | ORAL_TABLET | Freq: Every day | ORAL | Status: DC
  Administered 2018-08-13 – 2018-08-14 (×2): 25 mg via ORAL
  Filled 2018-08-12 (×3): qty 1

## 2018-08-12 MED ORDER — TRANEXAMIC ACID-NACL 1000-0.7 MG/100ML-% IV SOLN
1000.0000 mg | INTRAVENOUS | Status: AC
  Administered 2018-08-12: 1000 mg via INTRAVENOUS
  Filled 2018-08-12: qty 100

## 2018-08-12 MED ORDER — MORPHINE SULFATE (PF) 2 MG/ML IV SOLN
0.5000 mg | INTRAVENOUS | Status: DC | PRN
  Administered 2018-08-12 – 2018-08-13 (×2): 1 mg via INTRAVENOUS
  Filled 2018-08-12 (×2): qty 1

## 2018-08-12 MED ORDER — CHLORHEXIDINE GLUCONATE 4 % EX LIQD: 60.0000 mL | Freq: Once | CUTANEOUS | Status: DC

## 2018-08-12 MED ORDER — LACTATED RINGERS IV SOLN
INTRAVENOUS | Status: DC
  Administered 2018-08-12: 10:00:00 via INTRAVENOUS

## 2018-08-12 MED ORDER — POVIDONE-IODINE 10 % EX SWAB
2.0000 "application " | Freq: Once | CUTANEOUS | Status: AC
  Administered 2018-08-12: 2 via TOPICAL

## 2018-08-12 MED ORDER — METHOCARBAMOL 500 MG PO TABS
500.0000 mg | ORAL_TABLET | Freq: Four times a day (QID) | ORAL | Status: DC | PRN
  Administered 2018-08-12: 500 mg via ORAL
  Filled 2018-08-12: qty 1

## 2018-08-12 MED ORDER — MIDAZOLAM HCL 5 MG/5ML IJ SOLN
INTRAMUSCULAR | Status: DC | PRN
  Administered 2018-08-12: 0.5 mg via INTRAVENOUS
  Administered 2018-08-12: 1 mg via INTRAVENOUS

## 2018-08-12 MED ORDER — FENTANYL CITRATE (PF) 100 MCG/2ML IJ SOLN: 25.0000 ug | INTRAMUSCULAR | Status: DC | PRN

## 2018-08-12 MED ORDER — ACETAMINOPHEN 500 MG PO TABS
1000.0000 mg | ORAL_TABLET | Freq: Four times a day (QID) | ORAL | Status: DC
  Administered 2018-08-12 (×2): 1000 mg via ORAL
  Filled 2018-08-12 (×3): qty 2

## 2018-08-12 MED ORDER — MENTHOL 3 MG MT LOZG: 1.0000 | LOZENGE | OROMUCOSAL | Status: DC | PRN

## 2018-08-12 MED ORDER — ACETAMINOPHEN 10 MG/ML IV SOLN
1000.0000 mg | Freq: Four times a day (QID) | INTRAVENOUS | Status: DC
  Administered 2018-08-12: 1000 mg via INTRAVENOUS
  Filled 2018-08-12: qty 100

## 2018-08-12 MED ORDER — SODIUM CHLORIDE 0.9 % IV SOLN
INTRAVENOUS | Status: DC
  Administered 2018-08-12 (×2): via INTRAVENOUS

## 2018-08-12 MED ORDER — METOCLOPRAMIDE HCL 5 MG/ML IJ SOLN
5.0000 mg | Freq: Three times a day (TID) | INTRAMUSCULAR | Status: DC | PRN
  Administered 2018-08-12 – 2018-08-13 (×2): 10 mg via INTRAVENOUS
  Filled 2018-08-12 (×2): qty 2

## 2018-08-12 MED ORDER — BUPIVACAINE LIPOSOME 1.3 % IJ SUSP
INTRAMUSCULAR | Status: DC | PRN
  Administered 2018-08-12: 20 mL

## 2018-08-12 MED ORDER — CEFAZOLIN SODIUM-DEXTROSE 2-4 GM/100ML-% IV SOLN
2.0000 g | Freq: Four times a day (QID) | INTRAVENOUS | Status: AC
  Administered 2018-08-12 (×2): 2 g via INTRAVENOUS
  Filled 2018-08-12 (×2): qty 100

## 2018-08-12 MED ORDER — TRANEXAMIC ACID-NACL 1000-0.7 MG/100ML-% IV SOLN
1000.0000 mg | Freq: Once | INTRAVENOUS | Status: AC
  Administered 2018-08-12: 1000 mg via INTRAVENOUS
  Filled 2018-08-12: qty 100

## 2018-08-12 MED ORDER — EPHEDRINE 5 MG/ML INJ
INTRAVENOUS | Status: AC
  Filled 2018-08-12: qty 10

## 2018-08-12 MED ORDER — DEXAMETHASONE SODIUM PHOSPHATE 10 MG/ML IJ SOLN
10.0000 mg | Freq: Once | INTRAMUSCULAR | Status: AC
  Administered 2018-08-13: 10 mg via INTRAVENOUS
  Filled 2018-08-12: qty 1

## 2018-08-12 MED ORDER — ONDANSETRON HCL 4 MG/2ML IJ SOLN: 4.0000 mg | Freq: Once | INTRAMUSCULAR | Status: DC | PRN

## 2018-08-12 MED ORDER — TRAMADOL HCL 50 MG PO TABS
50.0000 mg | ORAL_TABLET | Freq: Four times a day (QID) | ORAL | Status: DC | PRN
  Administered 2018-08-13: 100 mg via ORAL
  Filled 2018-08-12: qty 2

## 2018-08-12 MED ORDER — DOCUSATE SODIUM 100 MG PO CAPS
100.0000 mg | ORAL_CAPSULE | Freq: Two times a day (BID) | ORAL | Status: DC
  Administered 2018-08-12 – 2018-08-14 (×4): 100 mg via ORAL
  Filled 2018-08-12 (×4): qty 1

## 2018-08-12 MED ORDER — MIDAZOLAM HCL 2 MG/2ML IJ SOLN
INTRAMUSCULAR | Status: AC
  Filled 2018-08-12: qty 2

## 2018-08-12 MED ORDER — ONDANSETRON HCL 4 MG/2ML IJ SOLN
INTRAMUSCULAR | Status: DC | PRN
  Administered 2018-08-12: 4 mg via INTRAVENOUS

## 2018-08-12 MED ORDER — DEXAMETHASONE SODIUM PHOSPHATE 10 MG/ML IJ SOLN
8.0000 mg | Freq: Once | INTRAMUSCULAR | Status: AC
  Administered 2018-08-12: 8 mg via INTRAVENOUS

## 2018-08-12 MED ORDER — BUPIVACAINE IN DEXTROSE 0.75-8.25 % IT SOLN
INTRATHECAL | Status: DC | PRN
  Administered 2018-08-12: 1.4 mL via INTRATHECAL

## 2018-08-12 MED ORDER — SODIUM CHLORIDE (PF) 0.9 % IJ SOLN
INTRAMUSCULAR | Status: AC
  Filled 2018-08-12: qty 10

## 2018-08-12 MED ORDER — PROPOFOL 10 MG/ML IV BOLUS
INTRAVENOUS | Status: AC
  Filled 2018-08-12: qty 20

## 2018-08-12 MED ORDER — CEFAZOLIN SODIUM-DEXTROSE 2-4 GM/100ML-% IV SOLN
2.0000 g | INTRAVENOUS | Status: AC
  Administered 2018-08-12: 13:00:00 2 g via INTRAVENOUS
  Filled 2018-08-12: qty 100

## 2018-08-12 MED ORDER — METOCLOPRAMIDE HCL 5 MG PO TABS: 5.0000 mg | ORAL_TABLET | Freq: Three times a day (TID) | ORAL | Status: DC | PRN

## 2018-08-12 MED ORDER — SODIUM CHLORIDE (PF) 0.9 % IJ SOLN
INTRAMUSCULAR | Status: DC | PRN
  Administered 2018-08-12: 60 mL

## 2018-08-12 MED ORDER — SODIUM CHLORIDE (PF) 0.9 % IJ SOLN
INTRAMUSCULAR | Status: AC
  Filled 2018-08-12: qty 50

## 2018-08-12 MED ORDER — ONDANSETRON HCL 4 MG PO TABS: 4.0000 mg | ORAL_TABLET | Freq: Four times a day (QID) | ORAL | Status: DC | PRN

## 2018-08-12 MED ORDER — POLYETHYLENE GLYCOL 3350 17 G PO PACK: 17.0000 g | PACK | Freq: Every day | ORAL | Status: DC | PRN

## 2018-08-12 MED ORDER — OXYCODONE HCL 5 MG PO TABS: 5.0000 mg | ORAL_TABLET | Freq: Once | ORAL | Status: DC | PRN

## 2018-08-12 MED ORDER — ONDANSETRON HCL 4 MG/2ML IJ SOLN
4.0000 mg | Freq: Four times a day (QID) | INTRAMUSCULAR | Status: DC | PRN
  Administered 2018-08-12 – 2018-08-13 (×2): 4 mg via INTRAVENOUS
  Filled 2018-08-12 (×2): qty 2

## 2018-08-12 MED ORDER — GABAPENTIN 100 MG PO CAPS
200.0000 mg | ORAL_CAPSULE | Freq: Three times a day (TID) | ORAL | Status: DC
  Administered 2018-08-13 – 2018-08-14 (×4): 200 mg via ORAL
  Filled 2018-08-12 (×5): qty 2

## 2018-08-12 MED ORDER — DEXAMETHASONE SODIUM PHOSPHATE 10 MG/ML IJ SOLN
INTRAMUSCULAR | Status: AC
  Filled 2018-08-12: qty 1

## 2018-08-12 MED ORDER — PROPOFOL 500 MG/50ML IV EMUL
INTRAVENOUS | Status: DC | PRN
  Administered 2018-08-12: 50 ug/kg/min via INTRAVENOUS

## 2018-08-12 MED ORDER — DIPHENHYDRAMINE HCL 12.5 MG/5ML PO ELIX: 12.5000 mg | ORAL_SOLUTION | ORAL | Status: DC | PRN

## 2018-08-12 MED ORDER — FLEET ENEMA 7-19 GM/118ML RE ENEM: 1.0000 | ENEMA | Freq: Once | RECTAL | Status: DC | PRN

## 2018-08-12 MED ORDER — ASPIRIN EC 325 MG PO TBEC
325.0000 mg | DELAYED_RELEASE_TABLET | Freq: Two times a day (BID) | ORAL | Status: DC
  Administered 2018-08-13 – 2018-08-14 (×3): 325 mg via ORAL
  Filled 2018-08-12 (×3): qty 1

## 2018-08-12 MED ORDER — EPHEDRINE SULFATE-NACL 50-0.9 MG/10ML-% IV SOSY
PREFILLED_SYRINGE | INTRAVENOUS | Status: DC | PRN
  Administered 2018-08-12: 10 mg via INTRAVENOUS
  Administered 2018-08-12: 5 mg via INTRAVENOUS

## 2018-08-12 MED ORDER — BISACODYL 10 MG RE SUPP
10.0000 mg | Freq: Every day | RECTAL | Status: DC | PRN
  Administered 2018-08-14: 10 mg via RECTAL
  Filled 2018-08-12: qty 1

## 2018-08-12 SURGICAL SUPPLY — 56 items
ATTUNE PSFEM LTSZ4 NARCEM KNEE (Femur) ×2 IMPLANT
ATTUNE PSRP INSR SZ4 8 KNEE (Insert) ×1 IMPLANT
ATTUNE PSRP INSR SZ4 8MM KNEE (Insert) ×1 IMPLANT
BAG SPEC THK2 15X12 ZIP CLS (MISCELLANEOUS)
BAG ZIPLOCK 12X15 (MISCELLANEOUS) ×1 IMPLANT
BANDAGE ACE 6X5 VEL STRL LF (GAUZE/BANDAGES/DRESSINGS) ×3 IMPLANT
BASE TIBIAL ROT PLAT SZ 3 KNEE (Knees) ×1 IMPLANT
BLADE SAG 18X100X1.27 (BLADE) ×3 IMPLANT
BLADE SAW SGTL 11.0X1.19X90.0M (BLADE) ×3 IMPLANT
BOWL SMART MIX CTS (DISPOSABLE) ×3 IMPLANT
BSPLAT TIB 3 CMNT ROT PLAT STR (Knees) ×1 IMPLANT
CEMENT HV SMART SET (Cement) ×6 IMPLANT
CLOSURE WOUND 1/2 X4 (GAUZE/BANDAGES/DRESSINGS) ×2
COVER SURGICAL LIGHT HANDLE (MISCELLANEOUS) ×3 IMPLANT
COVER WAND RF STERILE (DRAPES) IMPLANT
CUFF TOURN SGL QUICK 34 (TOURNIQUET CUFF) ×3
CUFF TRNQT CYL 34X4.125X (TOURNIQUET CUFF) ×1 IMPLANT
DECANTER SPIKE VIAL GLASS SM (MISCELLANEOUS) ×6 IMPLANT
DRAPE U-SHAPE 47X51 STRL (DRAPES) ×3 IMPLANT
DRSG ADAPTIC 3X8 NADH LF (GAUZE/BANDAGES/DRESSINGS) ×3 IMPLANT
DRSG PAD ABDOMINAL 8X10 ST (GAUZE/BANDAGES/DRESSINGS) ×3 IMPLANT
DURAPREP 26ML APPLICATOR (WOUND CARE) ×3 IMPLANT
ELECT REM PT RETURN 15FT ADLT (MISCELLANEOUS) ×3 IMPLANT
EVACUATOR 1/8 PVC DRAIN (DRAIN) ×3 IMPLANT
GAUZE SPONGE 4X4 12PLY STRL (GAUZE/BANDAGES/DRESSINGS) ×3 IMPLANT
GLOVE BIO SURGEON STRL SZ7 (GLOVE) ×3 IMPLANT
GLOVE BIO SURGEON STRL SZ8 (GLOVE) ×3 IMPLANT
GLOVE BIOGEL PI IND STRL 7.0 (GLOVE) ×1 IMPLANT
GLOVE BIOGEL PI IND STRL 8 (GLOVE) ×1 IMPLANT
GLOVE BIOGEL PI INDICATOR 7.0 (GLOVE) ×2
GLOVE BIOGEL PI INDICATOR 8 (GLOVE) ×2
GOWN STRL REUS W/TWL LRG LVL3 (GOWN DISPOSABLE) ×9 IMPLANT
HANDPIECE INTERPULSE COAX TIP (DISPOSABLE) ×3
HOLDER FOLEY CATH W/STRAP (MISCELLANEOUS) IMPLANT
IMMOBILIZER KNEE 20 (SOFTGOODS) ×3
IMMOBILIZER KNEE 20 THIGH 36 (SOFTGOODS) ×1 IMPLANT
KIT TURNOVER KIT A (KITS) IMPLANT
MANIFOLD NEPTUNE II (INSTRUMENTS) ×3 IMPLANT
PACK TOTAL KNEE CUSTOM (KITS) ×3 IMPLANT
PADDING CAST COTTON 6X4 STRL (CAST SUPPLIES) ×9 IMPLANT
PATELLA MEDIAL ATTUN 35MM KNEE (Knees) ×2 IMPLANT
PIN STEINMAN FIXATION KNEE (PIN) ×3 IMPLANT
PIN THREADED HEADED SIGMA (PIN) ×2 IMPLANT
PROTECTOR NERVE ULNAR (MISCELLANEOUS) ×3 IMPLANT
SET HNDPC FAN SPRY TIP SCT (DISPOSABLE) ×1 IMPLANT
STRIP CLOSURE SKIN 1/2X4 (GAUZE/BANDAGES/DRESSINGS) ×4 IMPLANT
SUT MNCRL AB 4-0 PS2 18 (SUTURE) ×3 IMPLANT
SUT STRATAFIX 0 PDS 27 VIOLET (SUTURE) ×3
SUT VIC AB 2-0 CT1 27 (SUTURE) ×9
SUT VIC AB 2-0 CT1 TAPERPNT 27 (SUTURE) ×3 IMPLANT
SUTURE STRATFX 0 PDS 27 VIOLET (SUTURE) ×1 IMPLANT
TIBIAL BASE ROT PLAT SZ 3 KNEE (Knees) ×3 IMPLANT
TRAY FOLEY CATH 14FRSI W/METER (CATHETERS) ×3 IMPLANT
WATER STERILE IRR 1000ML POUR (IV SOLUTION) ×6 IMPLANT
WRAP KNEE MAXI GEL POST OP (GAUZE/BANDAGES/DRESSINGS) ×3 IMPLANT
YANKAUER SUCT BULB TIP 10FT TU (MISCELLANEOUS) ×3 IMPLANT

## 2018-08-12 NOTE — Anesthesia Postprocedure Evaluation (Signed)
Anesthesia Post Note  Patient: Cassidy Bennett  Procedure(s) Performed: TOTAL KNEE ARTHROPLASTY (Left Knee)     Patient location during evaluation: PACU Anesthesia Type: Spinal Level of consciousness: awake and alert Pain management: pain level controlled Vital Signs Assessment: post-procedure vital signs reviewed and stable Respiratory status: spontaneous breathing, respiratory function stable and patient connected to nasal cannula oxygen Cardiovascular status: blood pressure returned to baseline and stable Postop Assessment: spinal receding and no apparent nausea or vomiting Anesthetic complications: no    Last Vitals:  Vitals:   08/12/18 1500 08/12/18 1536  BP: (!) 176/83 (!) 175/81  Pulse: 69 67  Resp: 17 16  Temp:  36.4 C  SpO2: 100% 100%    Last Pain:  Vitals:   08/12/18 1536  TempSrc: Oral  PainSc: [place]

## 2018-08-12 NOTE — Evaluation (Signed)
Physical Therapy Evaluation Patient Details Name: Cassidy Bennett MRN: [id number] DOB: Aug 18, 1940 Today's Date: 08/12/2018   History of Present Illness  L TKA  Clinical Impression  Pt is s/p TKA resulting in the deficits listed below (see PT Problem List). +2 mod assist for bed to recliner transfer. Pt had buckling of BLEs in standing. Pt nauseous after transfer, RN aware. Vital signs stable, BP 148/81, SaO2 97%.  Pt will benefit from skilled PT to increase their independence and safety with mobility to allow discharge to the venue listed below.      Follow Up Recommendations Follow surgeon's recommendation for DC plan and follow-up therapies    Equipment Recommendations  Rolling walker with 5" wheels;3in1 (PT)    Recommendations for Other Services       Precautions / Restrictions Precautions Precautions: Knee Restrictions Weight Bearing Restrictions: No Other Position/Activity Restrictions: WBAT      Mobility  Bed Mobility Overal bed mobility: Needs Assistance Bed Mobility: Supine to Sit     Supine to sit: Min assist     General bed mobility comments: assist to raise trunk and support LLE  Transfers Overall transfer level: Needs assistance Equipment used: Rolling walker (2 wheeled) Transfers: Sit to/from [facility] Sit to Stand: +2 safety/equipment;+2 physical assistance;Mod assist Stand pivot transfers: Mod assist;+2 safety/equipment;+2 physical assistance       General transfer comment: assist to rise/steady and to pivot with RW; BLEs buckling; nausea after SPT, BP 148/81  Ambulation/Gait             General Gait Details: deferred 2* buckling in standing  Stairs            Wheelchair Mobility    Modified Rankin (Stroke Patients Only)       Balance Overall balance assessment: Needs assistance   Sitting balance-Leahy Scale: Fair     Standing balance support: Bilateral upper extremity supported Standing  balance-Leahy Scale: Poor                               Pertinent Vitals/Pain Pain Assessment: 0-10 Pain Score: 6  Pain Location: L knee Pain Descriptors / Indicators: Sore Pain Intervention(s): Limited activity within patient's tolerance;Monitored during session;Premedicated before session;Ice applied    Home Living Family/patient expects to be discharged to:: Private residence Living Arrangements: Spouse/significant other Available Help at Discharge: Family;Available 24 hours/day Type of Home: House Home Access: Stairs to enter Entrance Stairs-Rails: None Entrance Stairs-Number of Steps: 1  + 1 Home Layout: Two level;Able to live on main level with bedroom/bathroom Home Equipment: Kasandra Knudsen - single point;Wheelchair - Education officer, community - power      Prior Function Level of Independence: Independent with assistive device(s)         Comments: walked with SPC     Hand Dominance        Extremity/Trunk Assessment   Upper Extremity Assessment Upper Extremity Assessment: Overall WFL for tasks assessed    Lower Extremity Assessment Lower Extremity Assessment: LLE deficits/detail LLE Deficits / Details: L knee AAROM 10-50*; SLR +2/5, knee ext +2/5 LLE Sensation: WNL    Cervical / Trunk Assessment Cervical / Trunk Assessment: Normal  Communication   Communication: No difficulties  Cognition Arousal/Alertness: Awake/alert Behavior During Therapy: WFL for tasks assessed/performed Overall Cognitive Status: Within Functional Limits for tasks assessed  General Comments      Exercises Total Joint Exercises Ankle Circles/Pumps: AROM;Both;10 reps;Supine Heel Slides: AAROM;Left;10 reps;Supine Long Arc Quad: AAROM;Left;5 reps;Seated   Assessment/Plan    PT Assessment Patient needs continued PT services  PT Problem List Decreased strength;Decreased range of motion;Decreased activity tolerance;Pain;Decreased  mobility       PT Treatment Interventions DME instruction;Gait training;Stair training;Functional mobility training;Therapeutic exercise;Therapeutic activities    PT Goals (Current goals can be found in the Care Plan section)  Acute Rehab PT Goals Patient Stated Goal: to walk PT Goal Formulation: With patient Time For Goal Achievement: 08/19/18 Potential to Achieve Goals: Good    Frequency 7X/week   Barriers to discharge        Co-evaluation               AM-PAC PT "6 Clicks" Mobility  Outcome Measure Help needed turning from your back to your side while in a flat bed without using bedrails?: A Little Help needed moving from lying on your back to sitting on the side of a flat bed without using bedrails?: A Lot Help needed moving to and from a bed to a chair (including a wheelchair)?: A Lot Help needed standing up from a chair using your arms (e.g., wheelchair or bedside chair)?: A Lot Help needed to walk in hospital room?: A Lot Help needed climbing 3-5 steps with a railing? : A Lot 6 Click Score: 13    End of Session Equipment Utilized During Treatment: Gait belt Activity Tolerance: Treatment limited secondary to medical complications (Comment)(nausea) Patient left: in chair;with call bell/phone within reach;with chair alarm set Nurse Communication: Mobility status PT Visit Diagnosis: Difficulty in walking, not elsewhere classified (R26.2);Pain;Muscle weakness (generalized) (M62.81) Pain - Right/Left: Left Pain - part of body: Knee    Time: [id number] PT Time Calculation (min) (ACUTE ONLY): 22 min   Charges:   PT Evaluation $PT Eval Low Complexity: 1 Low         Ralene BatheUhlenberg, Maryum Batterson Kistler PT 08/12/2018  Acute Rehabilitation Services Pager [phone] Office [phone]

## 2018-08-12 NOTE — Progress Notes (Signed)
AssistedDr. Brock with left, ultrasound guided, adductor canal block. Side rails up, monitors on throughout procedure. See vital signs in flow sheet. Tolerated Procedure well.  

## 2018-08-12 NOTE — Care Plan (Signed)
Ortho Bundle Case Management Note  Patient Details  Name: BRISEIS AGUILERA MRN: [id number] Date of Birth: 1940/07/19  L TKA scheduled on 08-12-18 DCP:  Home with spouse.  2 story home with 2 ste. MBR is on the 2nd floor. DME:  RW and 3-in-1 ordered through [place] PT:  [facility].  PT eval scheduled on 08-16-18.                   DME Arranged:  Walker rolling, 3-N-1 DME Agency:  [facility]  HH Arranged:  NA HH Agency:  NA  Additional Comments: Please contact me with any questions of if this plan should need to change.  Marianne Sofia, RN,CCM [facility]  [phone] 08/12/2018, 12:33 PM

## 2018-08-12 NOTE — Interval H&P Note (Signed)
History and Physical Interval Note:  08/12/2018 10:17 AM  Cassidy Bennett  has presented today for surgery, with the diagnosis of Left knee osteoarthritis.  The various methods of treatment have been discussed with the patient and family. After consideration of risks, benefits and other options for treatment, the patient has consented to  Procedure(s): TOTAL KNEE ARTHROPLASTY (Left) as a surgical intervention.  The patient's history has been reviewed, patient examined, no change in status, stable for surgery.  I have reviewed the patient's chart and labs.  Questions were answered to the patient's satisfaction.     Pilar Plate Vala Raffo

## 2018-08-12 NOTE — Discharge Instructions (Signed)
° °Dr. Frank Aluisio °Total Joint Specialist °[facility] °[address] °[place], [place] [address] °[phone] ° °TOTAL KNEE REPLACEMENT POSTOPERATIVE DIRECTIONS ° °Knee Rehabilitation, Guidelines Following Surgery  °Results after knee surgery are often greatly improved when you follow the exercise, range of motion and muscle strengthening exercises prescribed by your doctor. Safety measures are also important to protect the knee from further injury. Any time any of these exercises cause you to have increased pain or swelling in your knee joint, decrease the amount until you are comfortable again and slowly increase them. If you have problems or questions, call your caregiver or physical therapist for advice.  ° °HOME CARE INSTRUCTIONS  °• Remove items at home which could result in a fall. This includes throw rugs or furniture in walking pathways.  °· ICE to the affected knee every three hours for 30 minutes at a time and then as needed for pain and swelling.  Continue to use ice on the knee for pain and swelling from surgery. You may notice swelling that will progress down to the foot and ankle.  This is normal after surgery.  Elevate the leg when you are not up walking on it.   °· Continue to use the breathing machine which will help keep your temperature down.  It is common for your temperature to cycle up and down following surgery, especially at night when you are not up moving around and exerting yourself.  The breathing machine keeps your lungs expanded and your temperature down. °· Do not place pillow under knee, focus on keeping the knee straight while resting ° °DIET °You may resume your previous home diet once your are discharged from the hospital. ° °DRESSING / WOUND CARE / SHOWERING °You may change your dressing 3-5 days after surgery.  Then change the dressing every day with sterile gauze.  Please use good hand washing techniques before changing the dressing.  Do not use any lotions  or creams on the incision until instructed by your surgeon. °You may start showering once you are discharged home but do not submerge the incision under water. Just pat the incision dry and apply a dry gauze dressing on daily. °Change the surgical dressing daily and reapply a dry dressing each time. ° °ACTIVITY °Walk with your walker as instructed. °Use walker as long as suggested by your caregivers. °Avoid periods of inactivity such as sitting longer than an hour when not asleep. This helps prevent blood clots.  °You may resume a sexual relationship in one month or when given the OK by your doctor.  °You may return to work once you are cleared by your doctor.  °Do not drive a car for 6 weeks or until released by you surgeon.  °Do not drive while taking narcotics. ° °WEIGHT BEARING °Weight bearing as tolerated with assist device (walker, cane, etc) as directed, use it as long as suggested by your surgeon or therapist, typically at least 4-6 weeks. ° °POSTOPERATIVE CONSTIPATION PROTOCOL °Constipation - defined medically as fewer than three stools per week and severe constipation as less than one stool per week. ° °One of the most common issues patients have following surgery is constipation.  Even if you have a regular bowel pattern at home, your normal regimen is likely to be disrupted due to multiple reasons following surgery.  Combination of anesthesia, postoperative narcotics, change in appetite and fluid intake all can affect your bowels.  In order to avoid complications following surgery, here are some   recommendations in order to help you during your recovery period. ° °Colace (docusate) - Pick up an over-the-counter form of Colace or another stool softener and take twice a day as long as you are requiring postoperative pain medications.  Take with a full glass of water daily.  If you experience loose stools or diarrhea, hold the colace until you stool forms back up.  If your symptoms do not get better within 1  week or if they get worse, check with your doctor. ° °Dulcolax (bisacodyl) - Pick up over-the-counter and take as directed by the product packaging as needed to assist with the movement of your bowels.  Take with a full glass of water.  Use this product as needed if not relieved by Colace only.  ° °MiraLax (polyethylene glycol) - Pick up over-the-counter to have on hand.  MiraLax is a solution that will increase the amount of water in your bowels to assist with bowel movements.  Take as directed and can mix with a glass of water, juice, soda, coffee, or tea.  Take if you go more than two days without a movement. °Do not use MiraLax more than once per day. Call your doctor if you are still constipated or irregular after using this medication for 7 days in a row. ° °If you continue to have problems with postoperative constipation, please contact the office for further assistance and recommendations.  If you experience "the worst abdominal pain ever" or develop nausea or vomiting, please contact the office immediatly for further recommendations for treatment. ° °ITCHING °If you experience itching with your medications, try taking only a single pain pill, or even half a pain pill at a time.  You can also use Benadryl over the counter for itching or also to help with sleep.  ° °TED HOSE STOCKINGS °Wear the elastic stockings on both legs for three weeks following surgery during the day but you may remove then at night for sleeping. ° °MEDICATIONS °See your medication summary on the “After Visit Summary” that the nursing staff will review with you prior to discharge.  You may have some home medications which will be placed on hold until you complete the course of blood thinner medication.  It is important for you to complete the blood thinner medication as prescribed by your surgeon.  Continue your approved medications as instructed at time of discharge. ° °PRECAUTIONS °If you experience chest pain or shortness of breath -  call 911 immediately for transfer to the hospital emergency department.  °If you develop a fever greater that 101 F, purulent drainage from wound, increased redness or drainage from wound, foul odor from the wound/dressing, or calf pain - CONTACT YOUR SURGEON.   °                                                °FOLLOW-UP APPOINTMENTS °Make sure you keep all of your appointments after your operation with your surgeon and caregivers. You should call the office at the above phone number and make an appointment for approximately two weeks after the date of your surgery or on the date instructed by your surgeon outlined in the "After Visit Summary". ° °RANGE OF MOTION AND STRENGTHENING EXERCISES  °Rehabilitation of the knee is important following a knee injury or an operation. After just a few days of immobilization, the muscles of   the thigh which control the knee become weakened and shrink (atrophy). Knee exercises are designed to build up the tone and strength of the thigh muscles and to improve knee motion. Often times heat used for twenty to thirty minutes before working out will loosen up your tissues and help with improving the range of motion but do not use heat for the first two weeks following surgery. These exercises can be done on a training (exercise) mat, on the floor, on a table or on a bed. Use what ever works the best and is most comfortable for you Knee exercises include:  °• Leg Lifts - While your knee is still immobilized in a splint or cast, you can do straight leg raises. Lift the leg to 60 degrees, hold for 3 sec, and slowly lower the leg. Repeat 10-20 times 2-3 times daily. Perform this exercise against resistance later as your knee gets better.  °• Quad and Hamstring Sets - Tighten up the muscle on the front of the thigh (Quad) and hold for 5-10 sec. Repeat this 10-20 times hourly. Hamstring sets are done by pushing the foot backward against an object and holding for 5-10 sec. Repeat as with quad  sets.  °· Leg Slides: Lying on your back, slowly slide your foot toward your buttocks, bending your knee up off the floor (only go as far as is comfortable). Then slowly slide your foot back down until your leg is flat on the floor again. °· Angel Wings: Lying on your back spread your legs to the side as far apart as you can without causing discomfort.  °A rehabilitation program following serious knee injuries can speed recovery and prevent re-injury in the future due to weakened muscles. Contact your doctor or a physical therapist for more information on knee rehabilitation.  ° °IF YOU ARE TRANSFERRED TO A SKILLED REHAB FACILITY °If the patient is transferred to a skilled rehab facility following release from the hospital, a list of the current medications will be sent to the facility for the patient to continue.  When discharged from the skilled rehab facility, please have the facility set up the patient's Home Health Physical Therapy prior to being released. Also, the skilled facility will be responsible for providing the patient with their medications at time of release from the facility to include their pain medication, the muscle relaxants, and their blood thinner medication. If the patient is still at the rehab facility at time of the two week follow up appointment, the skilled rehab facility will also need to assist the patient in arranging follow up appointment in our office and any transportation needs. ° °MAKE SURE YOU:  °• Understand these instructions.  °• Get help right away if you are not doing well or get worse.  ° ° °Pick up stool softner and laxative for home use following surgery while on pain medications. °Do not submerge incision under water. °Please use good hand washing techniques while changing dressing each day. °May shower starting three days after surgery. °Please use a clean towel to pat the incision dry following showers. °Continue to use ice for pain and swelling after surgery. °Do not  use any lotions or creams on the incision until instructed by your surgeon. ° °

## 2018-08-12 NOTE — Anesthesia Procedure Notes (Signed)
Anesthesia Regional Block: Adductor canal block   Pre-Anesthetic Checklist: ,, timeout performed, Correct Patient, Correct Site, Correct Laterality, Correct Procedure, Correct Position, site marked, Risks and benefits discussed,  Surgical consent,  Pre-op evaluation,  At surgeon's request and post-op pain management  Laterality: Left  Prep: chloraprep       Needles:  Injection technique: Single-shot  Needle Type: Echogenic Needle     Needle Length: 10cm  Needle Gauge: 21     Additional Needles:   Narrative:  Start time: 08/12/2018 11:51 AM End time: 08/12/2018 11:55 AM Injection made incrementally with aspirations every 5 mL.  Performed by: Personally  Anesthesiologist: Audry Pili, MD  Additional Notes: No pain on injection. No increased resistance to injection. Injection made in 5cc increments. Good needle visualization. Patient tolerated the procedure well.

## 2018-08-12 NOTE — Transfer of Care (Signed)
Immediate Anesthesia Transfer of Care Note  Patient: Cassidy Bennett  Procedure(s) Performed: TOTAL KNEE ARTHROPLASTY (Left Knee)  Patient Location: PACU  Anesthesia Type:Spinal  Level of Consciousness: awake, alert  and oriented  Airway & Oxygen Therapy: Patient Spontanous Breathing and Patient connected to face mask oxygen  Post-op Assessment: Report given to RN and Post -op Vital signs reviewed and stable  Post vital signs: Reviewed and stable  Last Vitals:  Vitals Value Taken Time  BP    Temp    Pulse    Resp    SpO2      Last Pain:  Vitals:   08/12/18 1159  TempSrc:   PainSc: 0-No pain      Patients Stated Pain Goal: 4 (93/26/71 2458)  Complications: No apparent anesthesia complications

## 2018-08-12 NOTE — Op Note (Signed)
OPERATIVE REPORT-TOTAL KNEE ARTHROPLASTY   Pre-operative diagnosis- Osteoarthritis  Left knee(s)  Post-operative diagnosis- Osteoarthritis Left knee(s)  Procedure-  Left  Total Knee Arthroplasty (Depuy Attune)  Surgeon- Gus RankinFrank V. Elizabeth Haff, MD  Assistant- Arther AbbottKristie Edmisten, PA-C   Anesthesia-  Adductor canal block and spinal  EBL-50 mL   Drains Hemovac  Tourniquet time-  Total Tourniquet Time Documented: Thigh (Left) - 35 minutes Total: Thigh (Left) - 35 minutes     Complications- None  Condition-PACU - hemodynamically stable.   Brief Clinical Note  Cassidy Bennett is a 78 y.o. year old female with end stage OA of her left knee with progressively worsening pain and dysfunction. She has constant pain, with activity and at rest and significant functional deficits with difficulties even with ADLs. She has had extensive non-op management including analgesics, injections of cortisone and viscosupplements, and home exercise program, but remains in significant pain with significant dysfunction. Radiographs show bone on bone arthritis medial compartment. She presents now for left Total Knee Arthroplasty.    Procedure in detail---   The patient is brought into the operating room and positioned supine on the operating table. After successful administration of  Adductor canal block and spinal,   a tourniquet is placed high on the  Left thigh(s) and the lower extremity is prepped and draped in the usual sterile fashion. Time out is performed by the operating team and then the  Left lower extremity is wrapped in Esmarch, knee flexed and the tourniquet inflated to 300 mmHg.       A midline incision is made with a ten blade through the subcutaneous tissue to the level of the extensor mechanism. A fresh blade is used to make a medial parapatellar arthrotomy. Soft tissue over the proximal medial tibia is subperiosteally elevated to the joint line with a knife and into the semimembranosus bursa  with a Cobb elevator. Soft tissue over the proximal lateral tibia is elevated with attention being paid to avoiding the patellar tendon on the tibial tubercle. The patella is everted, knee flexed 90 degrees and the ACL and PCL are removed. Findings are bone on bone medial with large medial and patellar osteophytes.        The drill is used to create a starting hole in the distal femur and the canal is thoroughly irrigated with sterile saline to remove the fatty contents. The 5 degree Left  valgus alignment guide is placed into the femoral canal and the distal femoral cutting block is pinned to remove 9 mm off the distal femur. Resection is made with an oscillating saw.      The tibia is subluxed forward and the menisci are removed. The extramedullary alignment guide is placed referencing proximally at the medial aspect of the tibial tubercle and distally along the second metatarsal axis and tibial crest. The block is pinned to remove 2mm off the more deficient medial  side. Resection is made with an oscillating saw. Size 3is the most appropriate size for the tibia and the proximal tibia is prepared with the modular drill and keel punch for that size.      The femoral sizing guide is placed and size 4 is most appropriate. Rotation is marked off the epicondylar axis and confirmed by creating a rectangular flexion gap at 90 degrees. The size 4 cutting block is pinned in this rotation and the anterior, posterior and chamfer cuts are made with the oscillating saw. The intercondylar block is then placed and that cut is made.  Trial size 3 tibial component, trial size 4 narrow posterior stabilized femur and a 8  mm posterior stabilized rotating platform insert trial is placed. Full extension is achieved with excellent varus/valgus and anterior/posterior balance throughout full range of motion. The patella is everted and thickness measured to be 22  mm. Free hand resection is taken to 12 mm, a 35 template is placed,  lug holes are drilled, trial patella is placed, and it tracks normally. Osteophytes are removed off the posterior femur with the trial in place. All trials are removed and the cut bone surfaces prepared with pulsatile lavage. Cement is mixed and once ready for implantation, the size 3 tibial implant, size  4  narrowposterior stabilized femoral component, and the size 35 patella are cemented in place and the patella is held with the clamp. The trial insert is placed and the knee held in full extension. The Exparel (20 ml mixed with 60 ml saline) is injected into the extensor mechanism, posterior capsule, medial and lateral gutters and subcutaneous tissues.  All extruded cement is removed and once the cement is hard the permanent 8 mm posterior stabilized rotating platform insert is placed into the tibial tray.      The wound is copiously irrigated with saline solution and the extensor mechanism closed over a hemovac drain with #1 V-loc suture. The tourniquet is released for a total tourniquet time of 34  minutes. Flexion against gravity is 140 degrees and the patella tracks normally. Subcutaneous tissue is closed with 2.0 vicryl and subcuticular with running 4.0 Monocryl. The incision is cleaned and dried and steri-strips and a bulky sterile dressing are applied. The limb is placed into a knee immobilizer and the patient is awakened and transported to recovery in stable condition.      Please note that a surgical assistant was a medical necessity for this procedure in order to perform it in a safe and expeditious manner. Surgical assistant was necessary to retract the ligaments and vital neurovascular structures to prevent injury to them and also necessary for proper positioning of the limb to allow for anatomic placement of the prosthesis.   Dione Plover Glora Hulgan, MD    08/12/2018, 1:45 PM

## 2018-08-12 NOTE — Anesthesia Procedure Notes (Signed)
Procedure Name: MAC Date/Time: 08/12/2018 12:33 PM Performed by: Eben Burow, CRNA Pre-anesthesia Checklist: Patient identified, Emergency Drugs available, Suction available, Patient being monitored and Timeout performed Oxygen Delivery Method: Simple face mask Dental Injury: Teeth and Oropharynx as per pre-operative assessment

## 2018-08-12 NOTE — Plan of Care (Signed)
PLAN OF CARE FOR OPERATIVE DAY DISCUSSED WITH PATIENT.

## 2018-08-12 NOTE — Anesthesia Procedure Notes (Signed)
Spinal  Patient location during procedure: OR Start time: 08/12/2018 12:35 PM End time: 08/12/2018 12:40 PM Staffing Anesthesiologist: Audry Pili, MD Performed: anesthesiologist  Preanesthetic Checklist Completed: patient identified, surgical consent, pre-op evaluation, timeout performed, IV checked, risks and benefits discussed and monitors and equipment checked Spinal Block Patient position: sitting Prep: DuraPrep Patient monitoring: heart rate, cardiac monitor, continuous pulse ox and blood pressure Approach: midline Location: L3-4 Injection technique: single-shot Needle Needle type: Quincke  Needle gauge: 22 G Additional Notes Consent was obtained prior to the procedure with all questions answered and concerns addressed. Risks including, but not limited to, bleeding, infection, nerve damage, paralysis, failed block, inadequate analgesia, allergic reaction, high spinal, itching, and headache were discussed and the patient wished to proceed. Functioning IV was confirmed and monitors were applied. Sterile prep and drape, including hand hygiene, mask, and sterile gloves were used. The patient was positioned and the spine was prepped. The skin was anesthetized with lidocaine. Free flow of clear CSF was obtained prior to injecting local anesthetic into the CSF. The spinal needle aspirated freely following injection. The needle was carefully withdrawn. The patient tolerated the procedure well.   Renold Don, MD

## 2018-08-13 ENCOUNTER — Encounter (HOSPITAL_COMMUNITY): Payer: Self-pay | Admitting: Orthopedic Surgery

## 2018-08-13 LAB — BASIC METABOLIC PANEL
Anion gap: 10 (ref 5–15)
BUN: 14 mg/dL (ref 8–23)
CO2: 21 mmol/L — ABNORMAL LOW (ref 22–32)
Calcium: 8.1 mg/dL — ABNORMAL LOW (ref 8.9–10.3)
Chloride: 105 mmol/L (ref 98–111)
Creatinine, Ser: 0.59 mg/dL (ref 0.44–1.00)
GFR calc Af Amer: >60 mL/min (ref >60)
GFR calc non Af Amer: >60 mL/min (ref >60)
Glucose, Bld: 152 mg/dL — ABNORMAL HIGH (ref 70–99)
Potassium: 3.6 mmol/L (ref 3.5–5.1)
Sodium: 136 mmol/L (ref 135–145)

## 2018-08-13 LAB — CBC
HCT: 32.1 % — ABNORMAL LOW (ref 36.0–46.0)
Hemoglobin: 10.8 g/dL — ABNORMAL LOW (ref 12.0–15.0)
MCH: 28.1 pg (ref 26.0–34.0)
MCHC: 33.6 g/dL (ref 30.0–36.0)
MCV: 83.4 fL (ref 80.0–100.0)
Platelets: 152 10*3/uL (ref 150–400)
RBC: 3.85 MIL/uL — ABNORMAL LOW (ref 3.87–5.11)
RDW: 13.6 % (ref 11.5–15.5)
WBC: 10.9 10*3/uL — ABNORMAL HIGH (ref 4.0–10.5)
nRBC: 0 % (ref 0.0–0.2)

## 2018-08-13 MED ORDER — HYDROCODONE-ACETAMINOPHEN 5-325 MG PO TABS: 1.0000 | ORAL_TABLET | ORAL | Status: DC | PRN

## 2018-08-13 MED ORDER — HYDROCODONE-ACETAMINOPHEN 7.5-325 MG PO TABS
1.0000 | ORAL_TABLET | ORAL | Status: DC | PRN
  Administered 2018-08-13 – 2018-08-14 (×2): 1 via ORAL
  Filled 2018-08-13 (×2): qty 1

## 2018-08-13 NOTE — Progress Notes (Signed)
Physical Therapy Treatment Patient Details Name: Cassidy PeakChristine K Speas MRN: [id number] DOB: 1940-09-12 Today's Date: 08/13/2018    History of Present Illness L TKA    PT Comments    Pt able to recall HEP, demonstrate and verbalize importance and understanding.  Improved mobility with no assistance required with bed mobility and initially cueing required for hand placement to assist with STS.  Able to increase distance with gait training, slow cadence no LOB.  Reviewed mechanics with step to pattern with good mechanics noted.  EOS pt left in chair with call bell within reach and ice applied to knee.  RN aware of status.      Follow Up Recommendations  Follow surgeon's recommendation for DC plan and follow-up therapies     Equipment Recommendations  Rolling walker with 5" wheels;3in1 (PT)    Recommendations for Other Services       Precautions / Restrictions Precautions Precautions: Knee Restrictions Weight Bearing Restrictions: No Other Position/Activity Restrictions: WBAT    Mobility  Bed Mobility Overal bed mobility: Modified Independent Bed Mobility: Supine to Sit     Supine to sit: Supervision     General bed mobility comments: Increased time to complete  Transfers Overall transfer level: Modified independent Equipment used: Rolling walker (2 wheeled) Transfers: Sit to/from Stand Sit to Stand: Min guard         General transfer comment: Cueing for hand placement for safety, RW for stability upon standing  Ambulation/Gait Ambulation/Gait assistance: Min guard Gait Distance (Feet): 80 Feet Assistive device: Rolling walker (2 wheeled) Gait Pattern/deviations: Step-through pattern;Decreased stance time - left;Decreased step length - right Gait velocity: decreased   General Gait Details: Cueing for sequence, slow cadence, no LOB   Stairs             Wheelchair Mobility    Modified Rankin (Stroke Patients Only)       Balance                                             Cognition Arousal/Alertness: Awake/alert Behavior During Therapy: WFL for tasks assessed/performed Overall Cognitive Status: Within Functional Limits for tasks assessed                                        Exercises Total Joint Exercises Ankle Circles/Pumps: AROM;Both;10 reps;Supine Quad Sets: Left;10 reps;Supine Short Arc Quad: Left;10 reps;Supine Heel Slides: AAROM;Left;10 reps;Supine Hip ABduction/ADduction: Both;10 reps;Supine Straight Leg Raises: Left;10 reps;Supine Long Arc Quad: Left;10 reps;Seated Goniometric ROM: Lt knee AROM: 12-80 degrees    General Comments        Pertinent Vitals/Pain Pain Assessment: 0-10 Pain Score: 6  Pain Location: L knee Pain Descriptors / Indicators: Sore;Tightness(c/o stiffness) Pain Intervention(s): Premedicated before session;Monitored during session;Repositioned;Ice applied    Home Living                      Prior Function            PT Goals (current goals can now be found in the care plan section)      Frequency    7X/week      PT Plan Current plan remains appropriate    Co-evaluation              AM-PAC PT "  6 Clicks" Mobility   Outcome Measure  Help needed turning from your back to your side while in a flat bed without using bedrails?: A Little Help needed moving from lying on your back to sitting on the side of a flat bed without using bedrails?: A Little Help needed moving to and from a bed to a chair (including a wheelchair)?: A Little Help needed standing up from a chair using your arms (e.g., wheelchair or bedside chair)?: A Little Help needed to walk in hospital room?: A Little Help needed climbing 3-5 steps with a railing? : A Little 6 Click Score: 18    End of Session Equipment Utilized During Treatment: Gait belt Activity Tolerance: Patient tolerated treatment well Patient left: in bed;with call bell/phone within  reach(Ice applied to knee) Nurse Communication: Mobility status PT Visit Diagnosis: Difficulty in walking, not elsewhere classified (R26.2);Pain;Muscle weakness (generalized) (M62.81) Pain - Right/Left: Left Pain - part of body: Knee     Time: 3785-8850 PT Time Calculation (min) (ACUTE ONLY): 35 min  Charges:  $Gait Training: 8-22 mins $Therapeutic Exercise: 8-22 mins                     [address] LPTA; CBIS [phone]  Aldona Lento 08/13/2018, 4:57 PM

## 2018-08-13 NOTE — Progress Notes (Signed)
   Subjective: 1 Day Post-Op Procedure(s) (LRB): TOTAL KNEE ARTHROPLASTY (Left) Patient reports pain as moderate.   Patient seen in rounds by Dr. Wynelle Link. Patient is well, and has had no acute complaints or problems other than pain in the left knee. No issues overnight. Foley catheter removed this AM. Denies chest pain, SOB, or calf pain.  We will continue therapy today.   Objective: Vital signs in last 24 hours: Temp:  [97.5 F (36.4 C)-98.1 F (36.7 C)] 98.1 F (36.7 C) (06/16 0803) Pulse Rate:  [61-78] 73 (06/16 0803) Resp:  [13-19] 16 (06/16 0803) BP: (128-194)/(70-108) 128/72 (06/16 0803) SpO2:  [98 %-100 %] 98 % (06/16 0803) Weight:  [53.1 kg] 53.1 kg (06/15 1025)  Intake/Output from previous day:  Intake/Output Summary (Last 24 hours) at 08/13/2018 0811 Last data filed at 08/13/2018 0625 Gross per 24 hour  Intake 3419.92 ml  Output 3160 ml  Net 259.92 ml    Labs: Recent Labs    08/13/18 0341  HGB 10.8*   Recent Labs    08/13/18 0341  WBC 10.9*  RBC 3.85*  HCT 32.1*  PLT 152   Recent Labs    08/13/18 0341  NA 136  K 3.6  CL 105  CO2 21*  BUN 14  CREATININE 0.59  GLUCOSE 152*  CALCIUM 8.1*   Exam: General - Patient is Alert and Oriented Extremity - Neurologically intact Neurovascular intact Sensation intact distally Dorsiflexion/Plantar flexion intact Dressing - dressing C/D/I Motor Function - intact, moving foot and toes well on exam.   Past Medical History:  Diagnosis Date  . Allergy   . Complication of anesthesia   . History of TB (tuberculosis)    treated 16 plus years ago last cxr 03-15-18  CXR every 5 years  . Hypercholesteremia    denies at preop  . Hypertension   . Osteoarthritis   . Osteoporosis   . PONV (postoperative nausea and vomiting)   . Post-menopausal   . Tuberculosis     Assessment/Plan: 1 Day Post-Op Procedure(s) (LRB): TOTAL KNEE ARTHROPLASTY (Left) Principal Problem:   OA (osteoarthritis) of knee Active  Problems:   Osteoarthritis of left knee  Estimated body mass index is 21.4 kg/m as calculated from the following:   Height as of this encounter: 5\' 2"  (1.575 m).   Weight as of this encounter: 53.1 kg. Advance diet Up with therapy  Anticipated LOS equal to or greater than 2 midnights due to - Age 10 and older with one or more of the following:  - Obesity  - Expected need for hospital services (PT, OT, Nursing) required for safe  discharge  - Anticipated need for postoperative skilled nursing care or inpatient rehab  - Active co-morbidities: None OR   - Unanticipated findings during/Post Surgery: None  - Patient is a high risk of re-admission due to: None    DVT Prophylaxis - Aspirin Weight bearing as tolerated. D/C O2 and pulse ox and try on room air. Hemovac pulled without difficulty, will continue therapy today.  Plan is to go Home after hospital stay. Plan for discharge tomorrow.   Theresa Duty, PA-C Orthopedic Surgery 08/13/2018, 8:11 AM

## 2018-08-13 NOTE — Progress Notes (Signed)
Physical Therapy Treatment Patient Details Name: Cassidy Bennett MRN: [id number] DOB: 1940-11-25 Today's Date: 08/13/2018    History of Present Illness L TKA    PT Comments    Pt supine in bed, friendly and willing to participate with therapy today.  Pt needed to use restroom initially, reviewed mechanics with safety for hand placement and use of RW for stability upon standing.  Therapist adjusted pt.'s new personal walker for proper height.  Able to ambulate 60 ft following restroom break with min cueing to improve mechanics including posture, heel strike and equal strike length, no LOB episodes during gait.  Instructed stair training with step to pattern, pt able to demonstrate and verbalize appropraite mechanics.  Educated benefits/importance of compliance with HEP, pt able to verbalize and demonstrate appropriate mechanics.  AROM 12-80 degrees.  EOS pt left in chair with ice on knee and call bell wtihin reach.    Follow Up Recommendations  Follow surgeon's recommendation for DC plan and follow-up therapies     Equipment Recommendations  Rolling walker with 5" wheels;3in1 (PT)    Recommendations for Other Services       Precautions / Restrictions Precautions Precautions: Knee Restrictions Weight Bearing Restrictions: No Other Position/Activity Restrictions: WBAT    Mobility  Bed Mobility Overal bed mobility: Modified Independent Bed Mobility: Supine to Sit     Supine to sit: Supervision;Min guard     General bed mobility comments: Increased time to complete  Transfers Overall transfer level: Needs assistance Equipment used: Rolling walker (2 wheeled) Transfers: Sit to/from Stand Sit to Stand: Min assist         General transfer comment: Cueing for hand placement for safety, RW for stability upon standing  Ambulation/Gait Ambulation/Gait assistance: Min guard Gait Distance (Feet): 60 Feet Assistive device: Rolling walker (2 wheeled) Gait  Pattern/deviations: Step-through pattern;Decreased stance time - left;Decreased step length - right Gait velocity: decreased   General Gait Details: Cueing for sequence, slow mechanics, no LOB   Stairs             Wheelchair Mobility    Modified Rankin (Stroke Patients Only)       Balance                                            Cognition Arousal/Alertness: Awake/alert Behavior During Therapy: WFL for tasks assessed/performed Overall Cognitive Status: Within Functional Limits for tasks assessed                                        Exercises Total Joint Exercises Ankle Circles/Pumps: AROM;Both;10 reps;Supine Quad Sets: Left;10 reps;Supine Short Arc Quad: Left;10 reps;Supine Heel Slides: AAROM;Left;10 reps;Supine Hip ABduction/ADduction: Both;10 reps;Supine Straight Leg Raises: Left;10 reps;Supine Long Arc Quad: Left;10 reps;Seated Goniometric ROM: Lt knee AROM: 12-80 degrees    General Comments        Pertinent Vitals/Pain Pain Assessment: 0-10 Pain Score: 6  Pain Location: L knee Pain Descriptors / Indicators: Sore Pain Intervention(s): Premedicated before session;Monitored during session;Repositioned;Ice applied    Home Living                      Prior Function            PT Goals (current goals can now be found in  the care plan section)      Frequency    7X/week      PT Plan Current plan remains appropriate    Co-evaluation              AM-PAC PT "6 Clicks" Mobility   Outcome Measure  Help needed turning from your back to your side while in a flat bed without using bedrails?: A Little Help needed moving from lying on your back to sitting on the side of a flat bed without using bedrails?: A Little Help needed moving to and from a bed to a chair (including a wheelchair)?: A Little Help needed standing up from a chair using your arms (e.g., wheelchair or bedside chair)?: A  Little Help needed to walk in hospital room?: A Little Help needed climbing 3-5 steps with a railing? : A Little 6 Click Score: 18    End of Session Equipment Utilized During Treatment: Gait belt Activity Tolerance: Patient tolerated treatment well Patient left: in chair;with call bell/phone within reach;with chair alarm set Nurse Communication: Mobility status PT Visit Diagnosis: Difficulty in walking, not elsewhere classified (R26.2);Pain;Muscle weakness (generalized) (M62.81) Pain - Right/Left: Left Pain - part of body: Knee     Time: 1110-1145 PT Time Calculation (min) (ACUTE ONLY): 35 min  Charges:  $Gait Training: 8-22 mins $Therapeutic Exercise: 8-22 mins                     [address]Casey Cockerham, LPTA; CBIS [phone]   Juel BurrowCockerham, Casey Jo 08/13/2018, 1:51 PM

## 2018-08-14 LAB — CBC
HCT: 31.7 % — ABNORMAL LOW (ref 36.0–46.0)
Hemoglobin: 10.5 g/dL — ABNORMAL LOW (ref 12.0–15.0)
MCH: 28.1 pg (ref 26.0–34.0)
MCHC: 33.1 g/dL (ref 30.0–36.0)
MCV: 84.8 fL (ref 80.0–100.0)
Platelets: 155 10*3/uL (ref 150–400)
RBC: 3.74 MIL/uL — ABNORMAL LOW (ref 3.87–5.11)
RDW: 13.9 % (ref 11.5–15.5)
WBC: 13.3 10*3/uL — ABNORMAL HIGH (ref 4.0–10.5)
nRBC: 0 % (ref 0.0–0.2)

## 2018-08-14 LAB — BASIC METABOLIC PANEL
Anion gap: 10 (ref 5–15)
BUN: 15 mg/dL (ref 8–23)
CO2: 23 mmol/L (ref 22–32)
Calcium: 8.7 mg/dL — ABNORMAL LOW (ref 8.9–10.3)
Chloride: 105 mmol/L (ref 98–111)
Creatinine, Ser: 0.52 mg/dL (ref 0.44–1.00)
GFR calc Af Amer: >60 mL/min (ref >60)
GFR calc non Af Amer: >60 mL/min (ref >60)
Glucose, Bld: 117 mg/dL — ABNORMAL HIGH (ref 70–99)
Potassium: 3.6 mmol/L (ref 3.5–5.1)
Sodium: 138 mmol/L (ref 135–145)

## 2018-08-14 MED ORDER — TRAMADOL HCL 50 MG PO TABS: 50.0000 mg | ORAL_TABLET | Freq: Four times a day (QID) | ORAL | 0 refills | Status: DC | PRN

## 2018-08-14 MED ORDER — GABAPENTIN 100 MG PO CAPS: 200.0000 mg | ORAL_CAPSULE | Freq: Three times a day (TID) | ORAL | 0 refills | Status: DC

## 2018-08-14 MED ORDER — HYDROCODONE-ACETAMINOPHEN 5-325 MG PO TABS: 1.0000 | ORAL_TABLET | Freq: Four times a day (QID) | ORAL | 0 refills | Status: DC | PRN

## 2018-08-14 MED ORDER — ASPIRIN 325 MG PO TBEC: 325.0000 mg | DELAYED_RELEASE_TABLET | Freq: Two times a day (BID) | ORAL | 0 refills | Status: AC

## 2018-08-14 MED ORDER — METHOCARBAMOL 500 MG PO TABS: 500.0000 mg | ORAL_TABLET | Freq: Four times a day (QID) | ORAL | 0 refills | Status: DC | PRN

## 2018-08-14 NOTE — Discharge Summary (Signed)
Physician Discharge Summary   Patient ID: Cassidy PeakChristine K Carchi MRN: [id number] DOB/AGE: 1940/07/01 78 y.o.  Admit date: 08/12/2018 Discharge date: 08/14/2018  Primary Diagnosis: Osteoarthritis, left knee  Admission Diagnoses:  Past Medical History:  Diagnosis Date   Allergy    Complication of anesthesia    History of TB (tuberculosis)    treated 45 plus years ago last cxr 03-15-18  CXR every 5 years   Hypercholesteremia    denies at preop   Hypertension    Osteoarthritis    Osteoporosis    PONV (postoperative nausea and vomiting)    Post-menopausal    Tuberculosis    Discharge Diagnoses:   Principal Problem:   OA (osteoarthritis) of knee Active Problems:   Osteoarthritis of left knee  Estimated body mass index is 21.4 kg/m as calculated from the following:   Height as of this encounter: 5\' 2"  (1.575 m).   Weight as of this encounter: 53.1 kg.  Procedure:  Procedure(s) (LRB): TOTAL KNEE ARTHROPLASTY (Left)   Consults: None  HPI: Cassidy Bennett is a 78 y.o. year old female with end stage OA of her left knee with progressively worsening pain and dysfunction. She has constant pain, with activity and at rest and significant functional deficits with difficulties even with ADLs. She has had extensive non-op management including analgesics, injections of cortisone and viscosupplements, and home exercise program, but remains in significant pain with significant dysfunction. Radiographs show bone on bone arthritis medial compartment. She presents now for left Total Knee Arthroplasty.    Laboratory Data: Admission on 08/12/2018, Discharged on 08/14/2018  Component Date Value Ref Range Status   ABO/RH(D) 08/12/2018 AB POS   Final   Antibody Screen 08/12/2018 NEG   Final   Sample Expiration 08/12/2018    Final                   Value:08/15/2018,2359 Performed at [facility], [address] [place], [place] [address]    WBC 08/13/2018  10.9* 4.0 - 10.5 K/uL Final   RBC 08/13/2018 3.85* 3.87 - 5.11 MIL/uL Final   Hemoglobin 08/13/2018 10.8* 12.0 - 15.0 g/dL Final   HCT 19/14/782906/16/2020 32.1* 36.0 - 46.0 % Final   MCV 08/13/2018 83.4  80.0 - 100.0 fL Final   MCH 08/13/2018 28.1  26.0 - 34.0 pg Final   MCHC 08/13/2018 33.6  30.0 - 36.0 g/dL Final   RDW 56/21/308606/16/2020 13.6  11.5 - 15.5 % Final   Platelets 08/13/2018 152  150 - 400 K/uL Final   nRBC 08/13/2018 0.0  0.0 - 0.2 % Final   Performed at [facility], [address] [place], [place] [address]   Sodium 08/13/2018 136  135 - 145 mmol/L Final   Potassium 08/13/2018 3.6  3.5 - 5.1 mmol/L Final   Chloride 08/13/2018 105  98 - 111 mmol/L Final   CO2 08/13/2018 21* 22 - 32 mmol/L Final   Glucose, Bld 08/13/2018 152* 70 - 99 mg/dL Final   BUN 96/29/528406/16/2020 14  8 - 23 mg/dL Final   Creatinine, Ser 08/13/2018 0.59  0.44 - 1.00 mg/dL Final   Calcium 13/24/401006/16/2020 8.1* 8.9 - 10.3 mg/dL Final   GFR calc non Af Amer 08/13/2018 >60  >60 mL/min Final   GFR calc Af Amer 08/13/2018 >60  >60 mL/min Final   Anion gap 08/13/2018 10  5 - 15 Final   Performed at [facility], [address] [place], [place] [address]  WBC 08/14/2018 13.3* 4.0 - 10.5 K/uL Final   RBC 08/14/2018 3.74* 3.87 - 5.11 MIL/uL Final   Hemoglobin 08/14/2018 10.5* 12.0 - 15.0 g/dL Final   HCT 16/11/9602 31.7* 36.0 - 46.0 % Final   MCV 08/14/2018 84.8  80.0 - 100.0 fL Final   MCH 08/14/2018 28.1  26.0 - 34.0 pg Final   MCHC 08/14/2018 33.1  30.0 - 36.0 g/dL Final   RDW 54/10/8117 13.9  11.5 - 15.5 % Final   Platelets 08/14/2018 155  150 - 400 K/uL Final   nRBC 08/14/2018 0.0  0.0 - 0.2 % Final   Performed at [facility], [address], [place], [place] [address]   Sodium 08/14/2018 138  135 - 145 mmol/L Final   Potassium 08/14/2018 3.6  3.5 - 5.1 mmol/L Final   Chloride 08/14/2018 105  98 - 111 mmol/L Final   CO2 08/14/2018 23   22 - 32 mmol/L Final   Glucose, Bld 08/14/2018 117* 70 - 99 mg/dL Final   BUN 95/62/1308 15  8 - 23 mg/dL Final   Creatinine, Ser 08/14/2018 0.52  0.44 - 1.00 mg/dL Final   Calcium 65/78/4696 8.7* 8.9 - 10.3 mg/dL Final   GFR calc non Af Amer 08/14/2018 >60  >60 mL/min Final   GFR calc Af Amer 08/14/2018 >60  >60 mL/min Final   Anion gap 08/14/2018 10  5 - 15 Final   Performed at [facility], [address], [place], [place] [address]  Hospital Outpatient Visit on 08/07/2018  Component Date Value Ref Range Status   aPTT 08/07/2018 27  24 - 36 seconds Final   Performed at [facility], [address] [place], [place] [address]   WBC 08/07/2018 5.4  4.0 - 10.5 K/uL Final   RBC 08/07/2018 5.13* 3.87 - 5.11 MIL/uL Final   Hemoglobin 08/07/2018 13.9  12.0 - 15.0 g/dL Final   HCT 40/11/2723 43.9  36.0 - 46.0 % Final   MCV 08/07/2018 85.6  80.0 - 100.0 fL Final   MCH 08/07/2018 27.1  26.0 - 34.0 pg Final   MCHC 08/07/2018 31.7  30.0 - 36.0 g/dL Final   RDW 36/64/4034 14.0  11.5 - 15.5 % Final   Platelets 08/07/2018 194  150 - 400 K/uL Final   nRBC 08/07/2018 0.0  0.0 - 0.2 % Final   Performed at [facility], [address] [place], [place] [address]   Sodium 08/07/2018 139  135 - 145 mmol/L Final   Potassium 08/07/2018 4.1  3.5 - 5.1 mmol/L Final   Chloride 08/07/2018 105  98 - 111 mmol/L Final   CO2 08/07/2018 26  22 - 32 mmol/L Final   Glucose, Bld 08/07/2018 103* 70 - 99 mg/dL Final   BUN 56/38/7564 13  8 - 23 mg/dL Final   Creatinine, Ser 08/07/2018 0.54  0.44 - 1.00 mg/dL Final   Calcium 33/29/5188 9.2  8.9 - 10.3 mg/dL Final   Total Protein 41/66/0630 8.0  6.5 - 8.1 g/dL Final   Albumin 16/02/930 4.5  3.5 - 5.0 g/dL Final   AST 35/57/3220 25  15 - 41 U/L Final   ALT 08/07/2018 24  0 - 44 U/L Final   Alkaline Phosphatase 08/07/2018 41  38 - 126 U/L Final   Total Bilirubin 08/07/2018 0.8   0.3 - 1.2 mg/dL Final   GFR calc non Af Amer 08/07/2018 >60  >60 mL/min Final   GFR calc Af Denyse Dago  08/07/2018 >60  >60 mL/min Final   Anion gap 08/07/2018 8  5 - 15 Final   Performed at [facility], [address] [place], [place] [address]   Prothrombin Time 08/07/2018 12.0  11.4 - 15.2 seconds Final   INR 08/07/2018 0.9  0.8 - 1.2 Final   Comment: (NOTE) INR goal varies based on device and disease states. Performed at [facility], [address] [place], [place] [address]    ABO/RH(D) 08/07/2018 AB POS   Final   Antibody Screen 08/07/2018 NEG   Final   Sample Expiration 08/07/2018 08/11/2018,2359   Final   Extend sample reason 08/07/2018 NO TRANSFUSIONS OR PREGNANCY IN THE PAST 3 MONTHS   Final   MRSA, PCR 08/07/2018 NEGATIVE  NEGATIVE Final   Staphylococcus aureus 08/07/2018 NEGATIVE  NEGATIVE Final   Comment: (NOTE) The Xpert SA Assay (FDA approved for NASAL specimens in patients 71 years of age and older), is one component of a comprehensive surveillance program. It is not intended to diagnose infection nor to guide or monitor treatment. Performed at [facility], [address], [place], [place] [address]   Hospital Outpatient Visit on 08/07/2018  Component Date Value Ref Range Status   SARS-CoV-2, NAA 08/07/2018 NOT DETECTED  NOT DETECTED Final   Comment: (NOTE) Testing was performed using the cobas(R) SARS-CoV-2 test. This test was developed and its performance characteristics determined by [employer]. This test has not been FDA cleared or approved. This test has been authorized by FDA under an Emergency Use Authorization (EUA). This test is only authorized for the duration of time the declaration that circumstances exist justifying the authorization of the emergency use of in vitro diagnostic tests for detection of SARS-CoV-2 virus and/or diagnosis of COVID-19 infection under section  564(b)(1) of the Act, 21 U.S.C. 914NWG-9(F)(6), unless the authorization is terminated or revoked sooner. When diagnostic testing is negative, the possibility of a false negative result should be considered in the context of a patient's recent exposures and the presence of clinical signs and symptoms consistent with COVID-19. An individual without symptoms of COVID-19 and who is not shedding SARS-CoV-2 virus would expect to have                           a negative (not detected) result in this assay. Performed At: [facility] [address] [place], [place] [id number] Jolene Schimke MD IO:[id number]    Coronavirus Source 08/07/2018 NASOPHARYNGEAL   Final   Performed at [facility], [address] [place], [place] [address]     X-Rays:No results found.  EKG: Orders placed or performed in visit on 03/15/18   EKG 12-Lead     Hospital Course: LAVINIA MCNEELY is a 78 y.o. who was admitted to [facility]. They were brought to the operating room on 08/12/2018 and underwent Procedure(s): TOTAL KNEE ARTHROPLASTY.  Patient tolerated the procedure well and was later transferred to the recovery room and then to the orthopaedic floor for postoperative care. They were given PO and IV analgesics for pain control following their surgery. They were given 24 hours of postoperative antibiotics of  Anti-infectives (From admission, onward)   Start     Dose/Rate Route Frequency Ordered Stop   08/12/18 1830  ceFAZolin (ANCEF) IVPB 2g/100 mL premix     2 g 200 mL/hr over 30 Minutes Intravenous Every 6 hours 08/12/18 1608 08/12/18 2359  08/12/18 1015  ceFAZolin (ANCEF) IVPB 2g/100 mL premix     2 g 200 mL/hr over 30 Minutes Intravenous On call to O.R. 08/12/18 1003 08/12/18 1242     and started on DVT prophylaxis in the form of Aspirin.   PT and OT were ordered for total joint protocol. Discharge planning consulted to help with postop disposition and equipment needs.  Patient had a good night on the evening of surgery. They started to get up OOB with therapy on POD #0. Hemovac drain was pulled without difficulty on day one. Continued to work with therapy into POD #2. Pt was seen during rounds on day two and was ready to go home. Dressing was changed and the incision was clean and intact. She was discharged to home later that day in stable condition.  Diet: Regular diet Activity: WBAT Follow-up: in 2 weeks Disposition: Home Discharged Condition: good   Discharge Instructions    Call MD / Call 911   Complete by: As directed    If you experience chest pain or shortness of breath, CALL 911 and be transported to the hospital emergency room.  If you develope a fever above 101 F, pus (white drainage) or increased drainage or redness at the wound, or calf pain, call your surgeon's office.   Change dressing   Complete by: As directed    Change the dressing daily with sterile 4 x 4 inch gauze dressing and apply TED hose.   Constipation Prevention   Complete by: As directed    Drink plenty of fluids.  Prune juice may be helpful.  You may use a stool softener, such as Colace (over the counter) 100 mg twice a day.  Use MiraLax (over the counter) for constipation as needed.   Diet - low sodium heart healthy   Complete by: As directed    Discharge instructions   Complete by: As directed    Dr. Ollen Gross Total Joint Specialist [facility] [address] [place], [place] [address] [phone]  TOTAL KNEE REPLACEMENT POSTOPERATIVE DIRECTIONS  Knee Rehabilitation, Guidelines Following Surgery  Results after knee surgery are often greatly improved when you follow the exercise, range of motion and muscle strengthening exercises prescribed by your doctor. Safety measures are also important to protect the knee from further injury. Any time any of these exercises cause you to have increased pain or swelling in your knee joint, decrease the amount until  you are comfortable again and slowly increase them. If you have problems or questions, call your caregiver or physical therapist for advice.   HOME CARE INSTRUCTIONS  Remove items at home which could result in a fall. This includes throw rugs or furniture in walking pathways.  ICE to the affected knee every three hours for 30 minutes at a time and then as needed for pain and swelling.  Continue to use ice on the knee for pain and swelling from surgery. You may notice swelling that will progress down to the foot and ankle.  This is normal after surgery.  Elevate the leg when you are not up walking on it.   Continue to use the breathing machine which will help keep your temperature down.  It is common for your temperature to cycle up and down following surgery, especially at night when you are not up moving around and exerting yourself.  The breathing machine keeps your lungs expanded and your temperature down. Do not place pillow under knee, focus on keeping the knee straight  while resting   DIET You may resume your previous home diet once your are discharged from the hospital.  DRESSING / WOUND CARE / SHOWERING You may change your dressing 3-5 days after surgery.  Then change the dressing every day with sterile gauze.  Please use good hand washing techniques before changing the dressing.  Do not use any lotions or creams on the incision until instructed by your surgeon. You may start showering once you are discharged home but do not submerge the incision under water. Just pat the incision dry and apply a dry gauze dressing on daily. Change the surgical dressing daily and reapply a dry dressing each time.  ACTIVITY Walk with your walker as instructed. Use walker as long as suggested by your caregivers. Avoid periods of inactivity such as sitting longer than an hour when not asleep. This helps prevent blood clots.  You may resume a sexual relationship in one month or when given the OK by your  doctor.  You may return to work once you are cleared by your doctor.  Do not drive a car for 6 weeks or until released by you surgeon.  Do not drive while taking narcotics.  WEIGHT BEARING Weight bearing as tolerated with assist device (walker, cane, etc) as directed, use it as long as suggested by your surgeon or therapist, typically at least 4-6 weeks.  POSTOPERATIVE CONSTIPATION PROTOCOL Constipation - defined medically as fewer than three stools per week and severe constipation as less than one stool per week.  One of the most common issues patients have following surgery is constipation.  Even if you have a regular bowel pattern at home, your normal regimen is likely to be disrupted due to multiple reasons following surgery.  Combination of anesthesia, postoperative narcotics, change in appetite and fluid intake all can affect your bowels.  In order to avoid complications following surgery, here are some recommendations in order to help you during your recovery period.  Colace (docusate) - Pick up an over-the-counter form of Colace or another stool softener and take twice a day as long as you are requiring postoperative pain medications.  Take with a full glass of water daily.  If you experience loose stools or diarrhea, hold the colace until you stool forms back up.  If your symptoms do not get better within 1 week or if they get worse, check with your doctor.  Dulcolax (bisacodyl) - Pick up over-the-counter and take as directed by the product packaging as needed to assist with the movement of your bowels.  Take with a full glass of water.  Use this product as needed if not relieved by Colace only.   MiraLax (polyethylene glycol) - Pick up over-the-counter to have on hand.  MiraLax is a solution that will increase the amount of water in your bowels to assist with bowel movements.  Take as directed and can mix with a glass of water, juice, soda, coffee, or tea.  Take if you go more than two  days without a movement. Do not use MiraLax more than once per day. Call your doctor if you are still constipated or irregular after using this medication for 7 days in a row.  If you continue to have problems with postoperative constipation, please contact the office for further assistance and recommendations.  If you experience "the worst abdominal pain ever" or develop nausea or vomiting, please contact the office immediatly for further recommendations for treatment.  ITCHING  If you experience itching with your  medications, try taking only a single pain pill, or even half a pain pill at a time.  You can also use Benadryl over the counter for itching or also to help with sleep.   TED HOSE STOCKINGS Wear the elastic stockings on both legs for three weeks following surgery during the day but you may remove then at night for sleeping.  MEDICATIONS See your medication summary on the "After Visit Summary" that the nursing staff will review with you prior to discharge.  You may have some home medications which will be placed on hold until you complete the course of blood thinner medication.  It is important for you to complete the blood thinner medication as prescribed by your surgeon.  Continue your approved medications as instructed at time of discharge.  PRECAUTIONS If you experience chest pain or shortness of breath - call 911 immediately for transfer to the hospital emergency department.  If you develop a fever greater that 101 F, purulent drainage from wound, increased redness or drainage from wound, foul odor from the wound/dressing, or calf pain - CONTACT YOUR SURGEON.                                                   FOLLOW-UP APPOINTMENTS Make sure you keep all of your appointments after your operation with your surgeon and caregivers. You should call the office at the above phone number and make an appointment for approximately two weeks after the date of your surgery or on the date  instructed by your surgeon outlined in the "After Visit Summary".   RANGE OF MOTION AND STRENGTHENING EXERCISES  Rehabilitation of the knee is important following a knee injury or an operation. After just a few days of immobilization, the muscles of the thigh which control the knee become weakened and shrink (atrophy). Knee exercises are designed to build up the tone and strength of the thigh muscles and to improve knee motion. Often times heat used for twenty to thirty minutes before working out will loosen up your tissues and help with improving the range of motion but do not use heat for the first two weeks following surgery. These exercises can be done on a training (exercise) mat, on the floor, on a table or on a bed. Use what ever works the best and is most comfortable for you Knee exercises include:  Leg Lifts - While your knee is still immobilized in a splint or cast, you can do straight leg raises. Lift the leg to 60 degrees, hold for 3 sec, and slowly lower the leg. Repeat 10-20 times 2-3 times daily. Perform this exercise against resistance later as your knee gets better.  Quad and Hamstring Sets - Tighten up the muscle on the front of the thigh (Quad) and hold for 5-10 sec. Repeat this 10-20 times hourly. Hamstring sets are done by pushing the foot backward against an object and holding for 5-10 sec. Repeat as with quad sets.  Leg Slides: Lying on your back, slowly slide your foot toward your buttocks, bending your knee up off the floor (only go as far as is comfortable). Then slowly slide your foot back down until your leg is flat on the floor again. Angel Wings: Lying on your back spread your legs to the side as far apart as you can without causing discomfort.  A  rehabilitation program following serious knee injuries can speed recovery and prevent re-injury in the future due to weakened muscles. Contact your doctor or a physical therapist for more information on knee rehabilitation.   IF YOU  ARE TRANSFERRED TO A SKILLED REHAB FACILITY If the patient is transferred to a skilled rehab facility following release from the hospital, a list of the current medications will be sent to the facility for the patient to continue.  When discharged from the skilled rehab facility, please have the facility set up the patient's Home Health Physical Therapy prior to being released. Also, the skilled facility will be responsible for providing the patient with their medications at time of release from the facility to include their pain medication, the muscle relaxants, and their blood thinner medication. If the patient is still at the rehab facility at time of the two week follow up appointment, the skilled rehab facility will also need to assist the patient in arranging follow up appointment in our office and any transportation needs.  MAKE SURE YOU:  Understand these instructions.  Get help right away if you are not doing well or get worse.    Pick up stool softner and laxative for home use following surgery while on pain medications. Do not submerge incision under water. Please use good hand washing techniques while changing dressing each day. May shower starting three days after surgery. Please use a clean towel to pat the incision dry following showers. Continue to use ice for pain and swelling after surgery. Do not use any lotions or creams on the incision until instructed by your surgeon.   Do not put a pillow under the knee. Place it under the heel.   Complete by: As directed    Driving restrictions   Complete by: As directed    No driving for two weeks   TED hose   Complete by: As directed    Use stockings (TED hose) for three weeks on both leg(s).  You may remove them at night for sleeping.   Weight bearing as tolerated   Complete by: As directed      Allergies as of 08/14/2018   No Known Allergies     Medication List    STOP taking these medications   ibuprofen 200 MG  tablet Commonly known as: ADVIL   naproxen 500 MG tablet Commonly known as: NAPROSYN     TAKE these medications   aspirin 325 MG EC tablet Take 1 tablet (325 mg total) by mouth 2 (two) times daily for 19 days. Take one tablet (325 mg) Aspirin two times a day for three weeks following surgery.Then take one baby Aspirin (81 mg) once a day for three weeks.Then discontinue aspirin.   atenolol 25 MG tablet Commonly known as: TENORMIN Take 1 tablet (25 mg total) by mouth daily.   b complex vitamins tablet Take 1 tablet by mouth daily.   CALCIUM 600 + D PO Take 1 tablet by mouth daily.   gabapentin 100 MG capsule Commonly known as: NEURONTIN Take 2 capsules (200 mg total) by mouth 3 (three) times daily. Take two 100 mg capsules three times a day for two weeks following surgery.Then take two 100 mg capsules two times a day for two weeks. Then take two 100 mg capsules once a day for two weeks. Then discontinue the Gabapentin.   HYDROcodone-acetaminophen 5-325 MG tablet Commonly known as: NORCO/VICODIN Take 1-2 tablets by mouth every 6 (six) hours as needed for moderate pain.  loratadine 10 MG tablet Commonly known as: CLARITIN Take 10 mg by mouth daily.   magnesium 30 MG tablet Take 30 mg by mouth 3 (three) times a week.   methocarbamol 500 MG tablet Commonly known as: ROBAXIN Take 1 tablet (500 mg total) by mouth every 6 (six) hours as needed for muscle spasms.   polyethylene glycol 17 g packet Commonly known as: MIRALAX / GLYCOLAX Take 17 g by mouth daily as needed for moderate constipation.   traMADol 50 MG tablet Commonly known as: ULTRAM Take 1-2 tablets (50-100 mg total) by mouth every 6 (six) hours as needed for moderate pain.   Vitamin A 2400 MCG (8000 UT) Tabs Take 8,000 Units by mouth daily.   vitamin B-12 1000 MCG tablet Commonly known as: CYANOCOBALAMIN Take 1,000 mcg by mouth daily.   VITAMIN C PO Take 1,000 mg by mouth daily.   VITAMIN D PO Take 1,000  Units by mouth daily.   vitamin E 400 UNIT capsule Take 400 Units by mouth daily.            Discharge Care Instructions  (From admission, onward)         Start     Ordered   08/13/18 0000  Weight bearing as tolerated     08/13/18 0813   08/13/18 0000  Change dressing    Comments: Change the dressing daily with sterile 4 x 4 inch gauze dressing and apply TED hose.   08/13/18 0813         Follow-up Information    Gaynelle Arabian, MD. Go on 08/27/2018.   Specialty: Orthopedic Surgery Why: You are scheduled for a postoperative appointment on 08-27-2018 at 1:00 pm.  Contact information: [address] [place] [address] [phone]        Therapy, [facility] Go on 08/16/2018.   Specialty: Physical Therapy Why: You are scheduled for a physical therapy appointment at [facility] on 08-16-2018 at 10:30 am.  Contact information: [address] [place] [address] [phone]           Signed: Griffith Citron, PA-C Orthopedic Surgery 08/14/2018, 11:40 AM

## 2018-08-14 NOTE — Progress Notes (Signed)
   Subjective: 2 Days Post-Op Procedure(s) (LRB): TOTAL KNEE ARTHROPLASTY (Left) Patient reports pain as mild.   Patient seen in rounds by Dr. Wynelle Link. Patient is well, and has had no acute complaints or problems other than pain in the left knee. No acute events overnight. Patient states she is ready to go home today.  Plan is to go Home after hospital stay.  Objective: Vital signs in last 24 hours: Temp:  [97.3 F (36.3 C)-98.7 F (37.1 C)] 97.8 F (36.6 C) (06/17 0606) Pulse Rate:  [65-73] 65 (06/17 0606) Resp:  [15-20] 16 (06/17 0606) BP: (128-185)/(69-78) 176/70 (06/17 0606) SpO2:  [97 %-100 %] 100 % (06/17 0606)  Intake/Output from previous day:  Intake/Output Summary (Last 24 hours) at 08/14/2018 0801 Last data filed at 08/14/2018 0600 Gross per 24 hour  Intake 1251.99 ml  Output 1600 ml  Net -348.01 ml    Intake/Output this shift: No intake/output data recorded.  Labs: Recent Labs    08/13/18 0341 08/14/18 0331  HGB 10.8* 10.5*   Recent Labs    08/13/18 0341 08/14/18 0331  WBC 10.9* 13.3*  RBC 3.85* 3.74*  HCT 32.1* 31.7*  PLT 152 155   Recent Labs    08/13/18 0341 08/14/18 0331  NA 136 138  K 3.6 3.6  CL 105 105  CO2 21* 23  BUN 14 15  CREATININE 0.59 0.52  GLUCOSE 152* 117*  CALCIUM 8.1* 8.7*   No results for input(s): LABPT, INR in the last 72 hours.  Exam: General - Patient is Alert and Oriented Extremity - Neurologically intact Sensation intact distally Intact pulses distally Dorsiflexion/Plantar flexion intact Dressing/Incision - clean, dry Motor Function - intact, moving foot and toes well on exam.   Past Medical History:  Diagnosis Date  . Allergy   . Complication of anesthesia   . History of TB (tuberculosis)    treated 15 plus years ago last cxr 03-15-18  CXR every 5 years  . Hypercholesteremia    denies at preop  . Hypertension   . Osteoarthritis   . Osteoporosis   . PONV (postoperative nausea and vomiting)   .  Post-menopausal   . Tuberculosis     Assessment/Plan: 2 Days Post-Op Procedure(s) (LRB): TOTAL KNEE ARTHROPLASTY (Left) Principal Problem:   OA (osteoarthritis) of knee Active Problems:   Osteoarthritis of left knee  Estimated body mass index is 21.4 kg/m as calculated from the following:   Height as of this encounter: 5\' 2"  (1.575 m).   Weight as of this encounter: 53.1 kg. Advance diet Up with therapy D/C IV fluids  DVT Prophylaxis - Aspirin Weight-bearing as tolerated  Plan for discharge home today after 1-2 sessions as long as she continues to meet goals with therapy. Scheduled for outpatient PT at [facility] in [place]. Follow up in 2 weeks in the office.  Griffith Citron, PA-C Orthopedic Surgery 08/14/2018, 8:01 AM

## 2018-08-14 NOTE — Progress Notes (Signed)
Physical Therapy Treatment Patient Details Name: Cassidy Bennett MRN: [id number] DOB: Dec 06, 1940 Today's Date: 08/14/2018    History of Present Illness L TKA    PT Comments    Reviewed exercises, pt able to recall majority and demonstrates good form with all exercises.  Able to demonstrate safe mechanics with all transfers and gait training.  Ambulated 120 ft with RW, no LOB episodes just slow cadence.  Instructed step to pattern with steps with min guard.  Pt attempted with bowel movement, unsuccessful just gas.  EOS pt left in chair with call bell within reach and ice applied to knee.    Follow Up Recommendations  Follow surgeon's recommendation for DC plan and follow-up therapies     Equipment Recommendations  Rolling walker with 5" wheels;3in1 (PT)    Recommendations for Other Services       Precautions / Restrictions Precautions Precautions: Knee Restrictions Weight Bearing Restrictions: No Other Position/Activity Restrictions: WBAT    Mobility  Bed Mobility               General bed mobility comments: Sitting in chair upon therapist entrance  Transfers Overall transfer level: Modified independent Equipment used: Rolling walker (2 wheeled) Transfers: Sit to/from Stand Sit to Stand: Min guard         General transfer comment: Able to demonstrate appropriate mechanics  for safety with sit to stand  Ambulation/Gait Ambulation/Gait assistance: Min guard Gait Distance (Feet): 120 Feet Assistive device: Rolling walker (2 wheeled) Gait Pattern/deviations: Step-through pattern;Decreased stance time - left;Decreased step length - right Gait velocity: decreased   General Gait Details: Cueing for sequence, slow cadence, no LOB   Stairs Stairs: Yes Stairs assistance: Min guard Stair Management: One rail Right Number of Stairs: 3 General stair comments: step to pattern   Wheelchair Mobility    Modified Rankin (Stroke Patients Only)        Balance                                            Cognition Arousal/Alertness: Awake/alert Behavior During Therapy: WFL for tasks assessed/performed Overall Cognitive Status: Within Functional Limits for tasks assessed                                        Exercises Total Joint Exercises Ankle Circles/Pumps: AROM;Both;10 reps;Supine Quad Sets: Left;10 reps;Supine Short Arc Quad: Left;10 reps;Supine Heel Slides: AAROM;Left;10 reps;Supine Straight Leg Raises: Left;10 reps;Supine Long Arc Quad: Left;10 reps;Seated    General Comments        Pertinent Vitals/Pain Pain Score: 5  Pain Descriptors / Indicators: Sore;Tightness Pain Intervention(s): Premedicated before session;Monitored during session;Limited activity within patient's tolerance;Repositioned;Ice applied    Home Living                      Prior Function            PT Goals (current goals can now be found in the care plan section)      Frequency    7X/week      PT Plan Current plan remains appropriate    Co-evaluation              AM-PAC PT "6 Clicks" Mobility   Outcome Measure  Help needed turning from your back  to your side while in a flat bed without using bedrails?: A Little Help needed moving from lying on your back to sitting on the side of a flat bed without using bedrails?: A Little Help needed moving to and from a bed to a chair (including a wheelchair)?: A Little Help needed standing up from a chair using your arms (e.g., wheelchair or bedside chair)?: A Little Help needed to walk in hospital room?: A Little Help needed climbing 3-5 steps with a railing? : A Little 6 Click Score: 18    End of Session Equipment Utilized During Treatment: Gait belt Activity Tolerance: Patient tolerated treatment well Patient left: in chair;with call bell/phone within reach(Ice applied to knee) Nurse Communication: Mobility status PT Visit Diagnosis:  Difficulty in walking, not elsewhere classified (R26.2);Pain;Muscle weakness (generalized) (M62.81) Pain - Right/Left: Left Pain - part of body: Knee     Time: 0998-3382 PT Time Calculation (min) (ACUTE ONLY): 30 min  Charges:  $Gait Training: 8-22 mins $Therapeutic Exercise: 8-22 mins                     [address], LPTA; [place]  Aldona Lento 08/14/2018, 2:11 PM

## 2018-08-16 DIAGNOSIS — Z471 Aftercare following joint replacement surgery: Secondary | ICD-10-CM | POA: Diagnosis not present

## 2018-08-16 DIAGNOSIS — Z96652 Presence of left artificial knee joint: Secondary | ICD-10-CM | POA: Diagnosis not present

## 2018-08-16 DIAGNOSIS — M25562 Pain in left knee: Secondary | ICD-10-CM | POA: Diagnosis not present

## 2018-08-16 DIAGNOSIS — R2689 Other abnormalities of gait and mobility: Secondary | ICD-10-CM | POA: Diagnosis not present

## 2018-08-16 DIAGNOSIS — M6281 Muscle weakness (generalized): Secondary | ICD-10-CM | POA: Diagnosis not present

## 2018-08-19 DIAGNOSIS — Z96652 Presence of left artificial knee joint: Secondary | ICD-10-CM | POA: Diagnosis not present

## 2018-08-19 DIAGNOSIS — Z471 Aftercare following joint replacement surgery: Secondary | ICD-10-CM | POA: Diagnosis not present

## 2018-08-19 DIAGNOSIS — R2689 Other abnormalities of gait and mobility: Secondary | ICD-10-CM | POA: Diagnosis not present

## 2018-08-19 DIAGNOSIS — M25562 Pain in left knee: Secondary | ICD-10-CM | POA: Diagnosis not present

## 2018-08-19 DIAGNOSIS — M6281 Muscle weakness (generalized): Secondary | ICD-10-CM | POA: Diagnosis not present

## 2018-08-21 DIAGNOSIS — M25562 Pain in left knee: Secondary | ICD-10-CM | POA: Diagnosis not present

## 2018-08-21 DIAGNOSIS — Z471 Aftercare following joint replacement surgery: Secondary | ICD-10-CM | POA: Diagnosis not present

## 2018-08-21 DIAGNOSIS — Z96652 Presence of left artificial knee joint: Secondary | ICD-10-CM | POA: Diagnosis not present

## 2018-08-21 DIAGNOSIS — M6281 Muscle weakness (generalized): Secondary | ICD-10-CM | POA: Diagnosis not present

## 2018-08-21 DIAGNOSIS — R2689 Other abnormalities of gait and mobility: Secondary | ICD-10-CM | POA: Diagnosis not present

## 2018-08-22 DIAGNOSIS — M25562 Pain in left knee: Secondary | ICD-10-CM | POA: Diagnosis not present

## 2018-08-22 DIAGNOSIS — Z96652 Presence of left artificial knee joint: Secondary | ICD-10-CM | POA: Diagnosis not present

## 2018-08-22 DIAGNOSIS — Z471 Aftercare following joint replacement surgery: Secondary | ICD-10-CM | POA: Diagnosis not present

## 2018-08-22 DIAGNOSIS — M6281 Muscle weakness (generalized): Secondary | ICD-10-CM | POA: Diagnosis not present

## 2018-08-22 DIAGNOSIS — R2689 Other abnormalities of gait and mobility: Secondary | ICD-10-CM | POA: Diagnosis not present

## 2018-08-26 DIAGNOSIS — M25562 Pain in left knee: Secondary | ICD-10-CM | POA: Diagnosis not present

## 2018-08-26 DIAGNOSIS — M6281 Muscle weakness (generalized): Secondary | ICD-10-CM | POA: Diagnosis not present

## 2018-08-26 DIAGNOSIS — Z96652 Presence of left artificial knee joint: Secondary | ICD-10-CM | POA: Diagnosis not present

## 2018-08-26 DIAGNOSIS — Z471 Aftercare following joint replacement surgery: Secondary | ICD-10-CM | POA: Diagnosis not present

## 2018-08-26 DIAGNOSIS — R2689 Other abnormalities of gait and mobility: Secondary | ICD-10-CM | POA: Diagnosis not present

## 2018-08-28 DIAGNOSIS — R2689 Other abnormalities of gait and mobility: Secondary | ICD-10-CM | POA: Diagnosis not present

## 2018-08-28 DIAGNOSIS — Z96652 Presence of left artificial knee joint: Secondary | ICD-10-CM | POA: Diagnosis not present

## 2018-08-28 DIAGNOSIS — M25562 Pain in left knee: Secondary | ICD-10-CM | POA: Diagnosis not present

## 2018-08-28 DIAGNOSIS — Z471 Aftercare following joint replacement surgery: Secondary | ICD-10-CM | POA: Diagnosis not present

## 2018-08-28 DIAGNOSIS — M6281 Muscle weakness (generalized): Secondary | ICD-10-CM | POA: Diagnosis not present

## 2018-09-02 DIAGNOSIS — M25562 Pain in left knee: Secondary | ICD-10-CM | POA: Diagnosis not present

## 2018-09-02 DIAGNOSIS — R2689 Other abnormalities of gait and mobility: Secondary | ICD-10-CM | POA: Diagnosis not present

## 2018-09-02 DIAGNOSIS — Z471 Aftercare following joint replacement surgery: Secondary | ICD-10-CM | POA: Diagnosis not present

## 2018-09-02 DIAGNOSIS — Z96652 Presence of left artificial knee joint: Secondary | ICD-10-CM | POA: Diagnosis not present

## 2018-09-02 DIAGNOSIS — M6281 Muscle weakness (generalized): Secondary | ICD-10-CM | POA: Diagnosis not present

## 2018-09-03 DIAGNOSIS — M25562 Pain in left knee: Secondary | ICD-10-CM | POA: Diagnosis not present

## 2018-09-05 DIAGNOSIS — M25562 Pain in left knee: Secondary | ICD-10-CM | POA: Diagnosis not present

## 2018-09-10 DIAGNOSIS — M25562 Pain in left knee: Secondary | ICD-10-CM | POA: Diagnosis not present

## 2018-09-13 DIAGNOSIS — Z471 Aftercare following joint replacement surgery: Secondary | ICD-10-CM | POA: Diagnosis not present

## 2018-09-13 DIAGNOSIS — Z96652 Presence of left artificial knee joint: Secondary | ICD-10-CM | POA: Diagnosis not present

## 2018-09-13 DIAGNOSIS — M25562 Pain in left knee: Secondary | ICD-10-CM | POA: Diagnosis not present

## 2018-09-17 DIAGNOSIS — M25562 Pain in left knee: Secondary | ICD-10-CM | POA: Diagnosis not present

## 2018-09-20 DIAGNOSIS — M25562 Pain in left knee: Secondary | ICD-10-CM | POA: Diagnosis not present

## 2018-09-24 DIAGNOSIS — M25562 Pain in left knee: Secondary | ICD-10-CM | POA: Diagnosis not present

## 2018-09-25 ENCOUNTER — Ambulatory Visit: Payer: Medicare Other | Admitting: Family Medicine

## 2018-09-27 DIAGNOSIS — M25562 Pain in left knee: Secondary | ICD-10-CM | POA: Diagnosis not present

## 2018-10-02 DIAGNOSIS — M25562 Pain in left knee: Secondary | ICD-10-CM | POA: Diagnosis not present

## 2018-10-04 DIAGNOSIS — M25562 Pain in left knee: Secondary | ICD-10-CM | POA: Diagnosis not present

## 2018-10-05 ENCOUNTER — Other Ambulatory Visit: Payer: Self-pay | Admitting: Adult Health

## 2018-10-05 DIAGNOSIS — I10 Essential (primary) hypertension: Secondary | ICD-10-CM

## 2018-10-08 DIAGNOSIS — M25562 Pain in left knee: Secondary | ICD-10-CM | POA: Diagnosis not present

## 2018-10-08 NOTE — Telephone Encounter (Signed)
Rx done. 

## 2018-10-08 NOTE — Telephone Encounter (Signed)
That is fine 

## 2018-10-11 DIAGNOSIS — M25562 Pain in left knee: Secondary | ICD-10-CM | POA: Diagnosis not present

## 2018-10-16 DIAGNOSIS — M25562 Pain in left knee: Secondary | ICD-10-CM | POA: Diagnosis not present

## 2018-10-18 DIAGNOSIS — M25562 Pain in left knee: Secondary | ICD-10-CM | POA: Diagnosis not present

## 2018-11-11 ENCOUNTER — Ambulatory Visit: Payer: Medicare Other

## 2018-11-13 ENCOUNTER — Ambulatory Visit (INDEPENDENT_AMBULATORY_CARE_PROVIDER_SITE_OTHER): Payer: Medicare Other | Admitting: *Deleted

## 2018-11-13 ENCOUNTER — Other Ambulatory Visit: Payer: Self-pay

## 2018-11-13 DIAGNOSIS — M810 Age-related osteoporosis without current pathological fracture: Secondary | ICD-10-CM

## 2018-11-13 MED ORDER — DENOSUMAB 60 MG/ML ~~LOC~~ SOSY
60.0000 mg | PREFILLED_SYRINGE | Freq: Once | SUBCUTANEOUS | Status: AC
  Administered 2018-11-13: 60 mg via SUBCUTANEOUS

## 2018-11-13 NOTE — Progress Notes (Signed)
Patient in for Prolia injection. Injection administered with no adverse reaction.

## 2018-11-14 DIAGNOSIS — H25812 Combined forms of age-related cataract, left eye: Secondary | ICD-10-CM | POA: Diagnosis not present

## 2018-11-14 DIAGNOSIS — H52203 Unspecified astigmatism, bilateral: Secondary | ICD-10-CM | POA: Diagnosis not present

## 2018-11-14 DIAGNOSIS — Z961 Presence of intraocular lens: Secondary | ICD-10-CM | POA: Diagnosis not present

## 2018-11-14 DIAGNOSIS — H43813 Vitreous degeneration, bilateral: Secondary | ICD-10-CM | POA: Diagnosis not present

## 2018-11-25 ENCOUNTER — Ambulatory Visit: Payer: Medicare Other | Admitting: Family Medicine

## 2018-11-27 ENCOUNTER — Encounter: Payer: Self-pay | Admitting: Family Medicine

## 2018-11-27 ENCOUNTER — Ambulatory Visit (INDEPENDENT_AMBULATORY_CARE_PROVIDER_SITE_OTHER): Payer: Medicare Other | Admitting: Family Medicine

## 2018-11-27 ENCOUNTER — Other Ambulatory Visit: Payer: Self-pay

## 2018-11-27 VITALS — BP 120/80 | HR 74 | Temp 97.9°F | Ht 62.0 in | Wt 115.0 lb

## 2018-11-27 DIAGNOSIS — E2839 Other primary ovarian failure: Secondary | ICD-10-CM

## 2018-11-27 DIAGNOSIS — M810 Age-related osteoporosis without current pathological fracture: Secondary | ICD-10-CM

## 2018-11-27 DIAGNOSIS — R7309 Other abnormal glucose: Secondary | ICD-10-CM | POA: Diagnosis not present

## 2018-11-27 DIAGNOSIS — Z23 Encounter for immunization: Secondary | ICD-10-CM

## 2018-11-27 DIAGNOSIS — E782 Mixed hyperlipidemia: Secondary | ICD-10-CM

## 2018-11-27 DIAGNOSIS — Z1231 Encounter for screening mammogram for malignant neoplasm of breast: Secondary | ICD-10-CM

## 2018-11-27 DIAGNOSIS — I10 Essential (primary) hypertension: Secondary | ICD-10-CM | POA: Diagnosis not present

## 2018-11-27 LAB — CBC WITH DIFFERENTIAL/PLATELET
Basophils Absolute: 0 10*3/uL (ref 0.0–0.1)
Basophils Relative: 0.5 % (ref 0.0–3.0)
Eosinophils Absolute: 0.1 10*3/uL (ref 0.0–0.7)
Eosinophils Relative: 1.2 % (ref 0.0–5.0)
HCT: 40.3 % (ref 36.0–46.0)
Hemoglobin: 13.5 g/dL (ref 12.0–15.0)
Lymphocytes Relative: 33.5 % (ref 12.0–46.0)
Lymphs Abs: 2 10*3/uL (ref 0.7–4.0)
MCHC: 33.5 g/dL (ref 30.0–36.0)
MCV: 81.1 fl (ref 78.0–100.0)
Monocytes Absolute: 0.3 10*3/uL (ref 0.1–1.0)
Monocytes Relative: 5.5 % (ref 3.0–12.0)
Neutro Abs: 3.6 10*3/uL (ref 1.4–7.7)
Neutrophils Relative %: 59.3 % (ref 43.0–77.0)
Platelets: 171 10*3/uL (ref 150.0–400.0)
RBC: 4.97 Mil/uL (ref 3.87–5.11)
RDW: 14.9 % (ref 11.5–15.5)
WBC: 6.1 10*3/uL (ref 4.0–10.5)

## 2018-11-27 LAB — COMPREHENSIVE METABOLIC PANEL
ALT: 19 U/L (ref 0–35)
AST: 23 U/L (ref 0–37)
Albumin: 4.7 g/dL (ref 3.5–5.2)
Alkaline Phosphatase: 63 U/L (ref 39–117)
BUN: 11 mg/dL (ref 6–23)
CO2: 27 mEq/L (ref 19–32)
Calcium: 10.1 mg/dL (ref 8.4–10.5)
Chloride: 102 mEq/L (ref 96–112)
Creatinine, Ser: 0.56 mg/dL (ref 0.40–1.20)
GFR: 104.46 mL/min (ref >60.00)
Glucose, Bld: 88 mg/dL (ref 70–99)
Potassium: 4 mEq/L (ref 3.5–5.1)
Sodium: 139 mEq/L (ref 135–145)
Total Bilirubin: 0.7 mg/dL (ref 0.2–1.2)
Total Protein: 8 g/dL (ref 6.0–8.3)

## 2018-11-27 LAB — LIPID PANEL
Cholesterol: 186 mg/dL (ref 0–200)
HDL: 49.3 mg/dL (ref >39.00)
NonHDL: 136.92
Total CHOL/HDL Ratio: 4
Triglycerides: 342 mg/dL — ABNORMAL HIGH (ref 0.0–149.0)
VLDL: 68.4 mg/dL — ABNORMAL HIGH (ref 0.0–40.0)

## 2018-11-27 LAB — VITAMIN D 25 HYDROXY (VIT D DEFICIENCY, FRACTURES): VITD: 53.01 ng/mL (ref 30.00–100.00)

## 2018-11-27 LAB — LDL CHOLESTEROL, DIRECT: Direct LDL: 78 mg/dL

## 2018-11-27 LAB — HEMOGLOBIN A1C: Hgb A1c MFr Bld: 6.3 % (ref 4.6–6.5)

## 2018-11-27 LAB — TSH: TSH: 3.71 u[IU]/mL (ref 0.35–4.50)

## 2018-11-27 MED ORDER — ATENOLOL 25 MG PO TABS: 25.0000 mg | ORAL_TABLET | Freq: Every day | ORAL | 3 refills | Status: DC

## 2018-11-27 NOTE — Progress Notes (Signed)
SARAYA TIREY DOB: November 01, 1940 Encounter date: 11/27/2018  This is a 78 y.o. female who presents with Chief Complaint  Patient presents with  . Follow-up    History of present illness: Had left TKA in June. Still a little numb, still a little stiff. Pain is very minimal. Still some swelling with walking. Doing daily exercises. Still hard to get up and down stairs - crawls up and down stairs when husband not home.   Last visit with me she had some elevation in bp. We planned to monitor and have her follow up for recheck after surgery. Currently on atenolol 25mg  daily.  She was anemic with slightly elevated wbc in June; will repeat today. Sugar also elevated at that time. Cholesterol was also elevated when checked back in March.   ?bone density not completed: she wasn't called about this. We will reorder today.  Last mammogram 12/2017 Last colonoscopy: 08/2013; repeat in 10years.   Allergies  Allergen Reactions  . Morphine And Related Nausea And Vomiting   Current Meds  Medication Sig  . Ascorbic Acid (VITAMIN C PO) Take 1,000 mg by mouth daily.   09/2013 atenolol (TENORMIN) 25 MG tablet TAKE 1 TABLET BY MOUTH DAILY  . b complex vitamins tablet Take 1 tablet by mouth daily.  . Calcium Carb-Cholecalciferol (CALCIUM 600 + D PO) Take 1 tablet by mouth daily.  . Cholecalciferol (VITAMIN D PO) Take 1,000 Units by mouth daily.   Marland Kitchen gabapentin (NEURONTIN) 100 MG capsule Take 2 capsules (200 mg total) by mouth 3 (three) times daily. Take two 100 mg capsules three times a day for two weeks following surgery.Then take two 100 mg capsules two times a day for two weeks. Then take two 100 mg capsules once a day for two weeks. Then discontinue the Gabapentin.  Marland Kitchen HYDROcodone-acetaminophen (NORCO/VICODIN) 5-325 MG tablet Take 1-2 tablets by mouth every 6 (six) hours as needed for moderate pain.  Marland Kitchen loratadine (CLARITIN) 10 MG tablet Take 10 mg by mouth daily.   . magnesium 30 MG tablet Take 30 mg by  mouth 3 (three) times a week.   . methocarbamol (ROBAXIN) 500 MG tablet Take 1 tablet (500 mg total) by mouth every 6 (six) hours as needed for muscle spasms.  . polyethylene glycol (MIRALAX / GLYCOLAX) packet Take 17 g by mouth daily as needed for moderate constipation.   . traMADol (ULTRAM) 50 MG tablet Take 1-2 tablets (50-100 mg total) by mouth every 6 (six) hours as needed for moderate pain.  . Vitamin A 2400 MCG (8000 UT) TABS Take 8,000 Units by mouth daily.   . vitamin B-12 (CYANOCOBALAMIN) 1000 MCG tablet Take 1,000 mcg by mouth daily.  . vitamin E 400 UNIT capsule Take 400 Units by mouth daily.    Review of Systems  Constitutional: Negative for chills, fatigue and fever.  Respiratory: Negative for cough, chest tightness, shortness of breath and wheezing.   Cardiovascular: Negative for chest pain, palpitations and leg swelling.  Musculoskeletal: Positive for arthralgias (improved significantly s/p TKA).    Objective:  BP 120/80 (BP Location: Left Arm, Patient Position: Sitting, Cuff Size: Normal)   Pulse 74   Temp 97.9 F (36.6 C) (Temporal)   Ht 5\' 2"  (1.575 m)   Wt 115 lb (52.2 kg)   SpO2 98%   BMI 21.03 kg/m   Weight: 115 lb (52.2 kg)   BP Readings from Last 3 Encounters:  11/27/18 120/80  08/14/18 (!) 176/70  08/07/18 (!) 180/93   08/16/18  Readings from Last 3 Encounters:  11/27/18 115 lb (52.2 kg)  08/12/18 117 lb (53.1 kg)  08/07/18 117 lb (53.1 kg)    Physical Exam Constitutional:      General: She is not in acute distress.    Appearance: She is well-developed.  Cardiovascular:     Rate and Rhythm: Normal rate and regular rhythm.     Heart sounds: Normal heart sounds. No murmur. No friction rub.  Pulmonary:     Effort: Pulmonary effort is normal. No respiratory distress.     Breath sounds: Normal breath sounds. No wheezing or rales.  Musculoskeletal:     Right lower leg: No edema.     Left lower leg: No edema.  Skin:    Comments: Well healed scar left  knee. No current edema of knee.  Neurological:     Mental Status: She is alert and oriented to person, place, and time.  Psychiatric:        Behavior: Behavior normal.     Assessment/Plan   1. Essential hypertension Significantly improved control. Continue current medication.  - CBC with Differential/Platelet; Future - Comprehensive metabolic panel; Future - atenolol (TENORMIN) 25 MG tablet; Take 1 tablet (25 mg total) by mouth daily.  Dispense: 90 tablet; Refill: 3 - Comprehensive metabolic panel - CBC with Differential/Platelet  2. Mixed hyperlipidemia Slightly elevated previously; recheck today. - Lipid panel; Future - TSH; Future - TSH - Lipid panel  3. Osteoporosis without current pathological fracture, unspecified osteoporosis type Has been getting prolia shots here. Will recheck density. - VITAMIN D 25 Hydroxy (Vit-D Deficiency, Fractures); Future - VITAMIN D 25 Hydroxy (Vit-D Deficiency, Fractures)  4. Elevated glucose - Hemoglobin A1c; Future - Hemoglobin A1c  5. Need for immunization against influenza - Flu Vaccine QUAD High Dose(Fluad)  6. Estrogen deficiency - DG Bone Density; Future  7. Encounter for screening mammogram for malignant neoplasm of breast - MM DIGITAL SCREENING BILATERAL; Future    Return in about 6 months (around 05/27/2019) for pending bloodwork, Chronic condition visit.     Micheline Rough, MD

## 2018-12-24 IMAGING — MR MR FEMUR*R* W/O CM
5 of 6 series · 32 of 40 positions shown · non-contrast
Comparison: 04/15/2017

CLINICAL DATA: Right thigh pain after exercising 1 week ago.

EXAM:
MRI OF THE RIGHT FEMUR WITHOUT CONTRAST
TECHNIQUE: Multiplanar, multisequence MR imaging of the right femur was
performed. No intravenous contrast was administered.

[Series 4: T1 · axial · 6.0mm · 1.04mm/px · z∈[-179,+241]mm · 11 of 57 slices shown (1 of 3)]
[im 1/57]
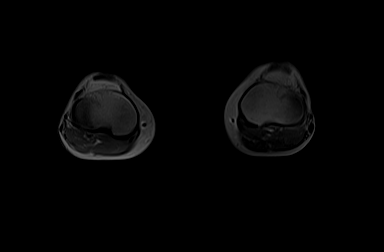
[im 6/57]
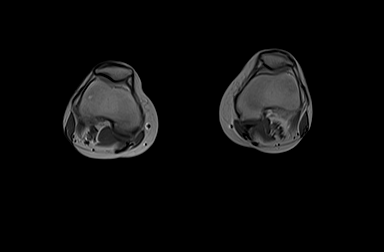
[im 12/57]
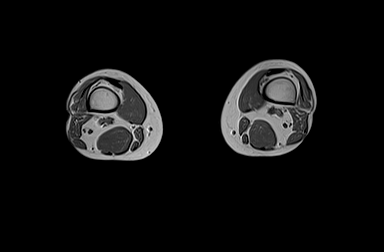
[im 17/57]
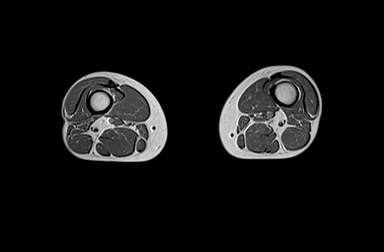
[im 23/57]
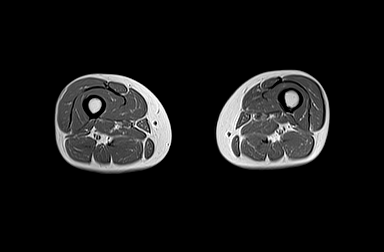
[im 29/57]
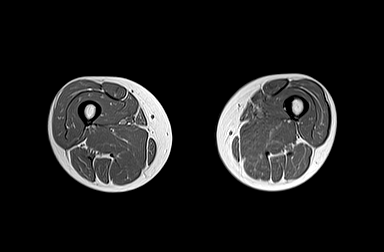
[im 34/57]
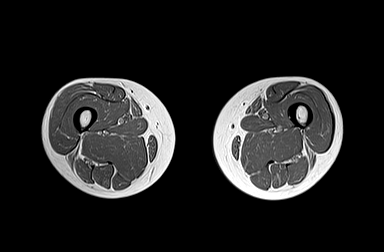
[im 40/57]
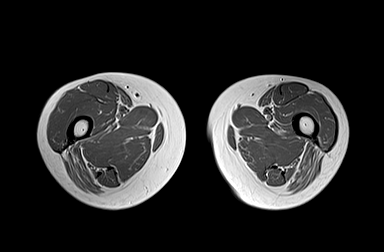
[im 45/57]
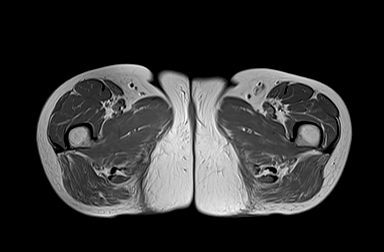
[im 51/57]
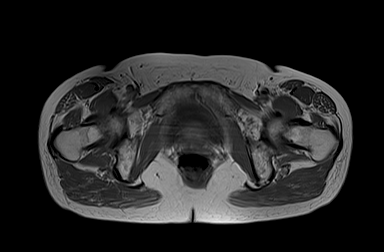
[im 57/57]
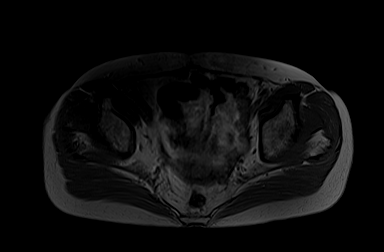

[Series 5: T2 fat-sat · axial · 6.0mm · 1.25mm/px · z∈[-179,+241]mm · 9 of 57 slices shown]
[im 1/57]
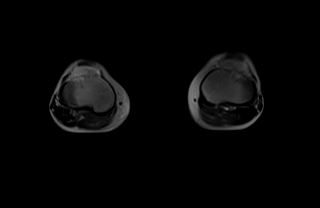
[im 11/57]
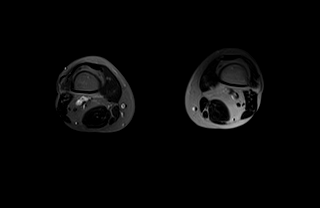
[im 16/57]
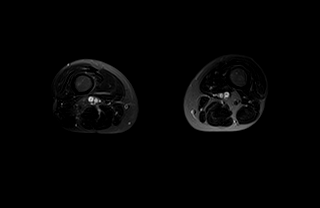
[im 26/57]
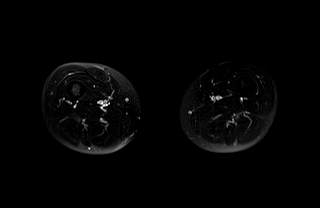
[im 31/57]
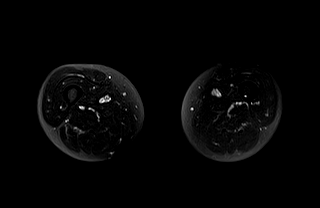
[im 41/57]
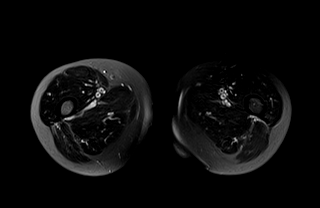
[im 46/57]
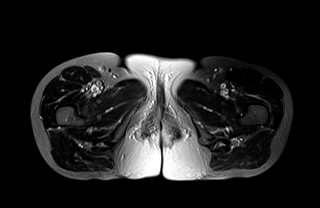
[im 51/57]
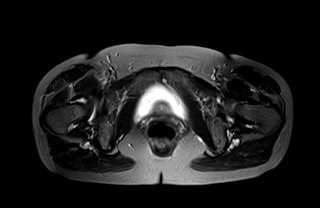
[im 57/57]
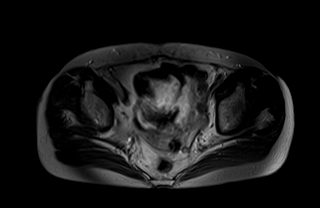

[Series 6: T1 · sagittal · 6.0mm · 0.67mm/px · 4 of 19 slices shown (2 of 3)]
[im 1/19]
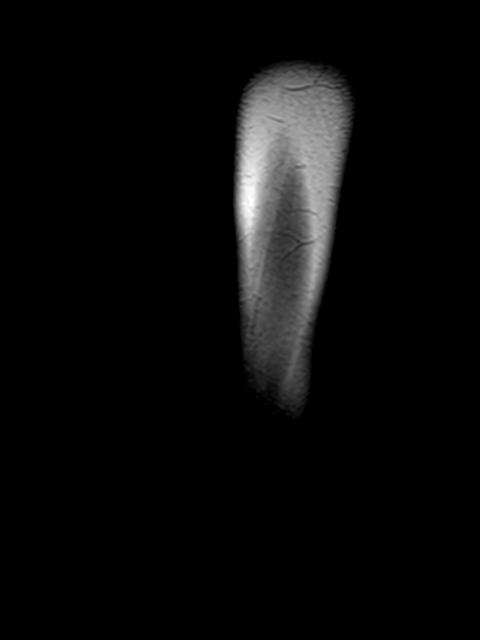
[im 7/19]
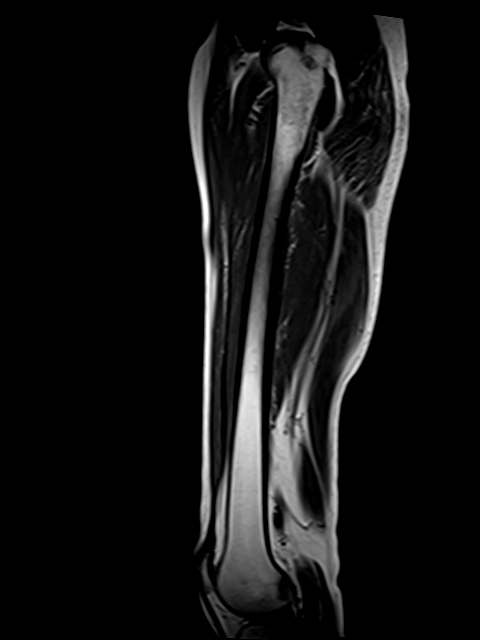
[im 13/19]
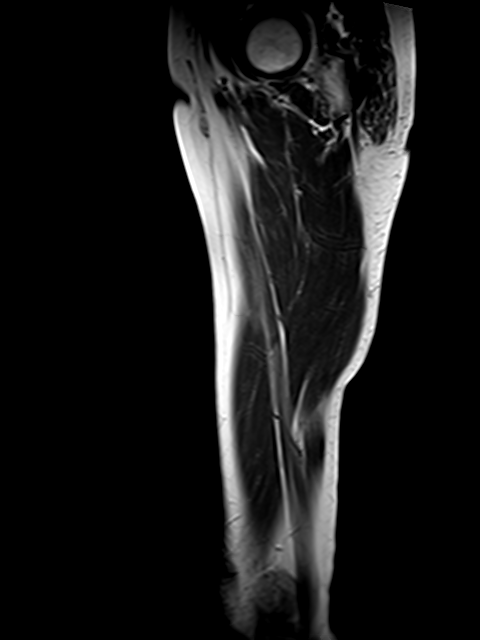
[im 19/19]
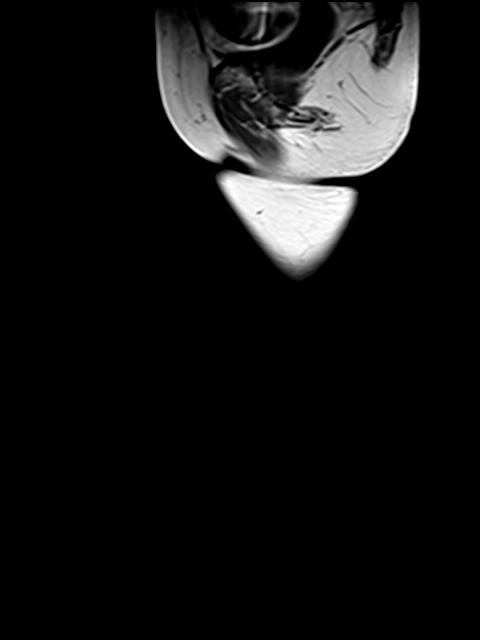

[Series 7: T1 · coronal · 4.0mm · 0.67mm/px · 5 of 23 slices shown (3 of 3)]
[im 1/23]
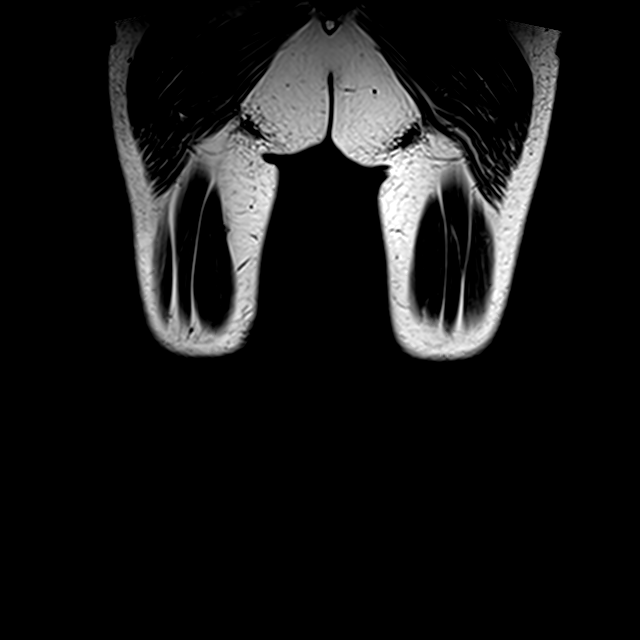
[im 6/23]
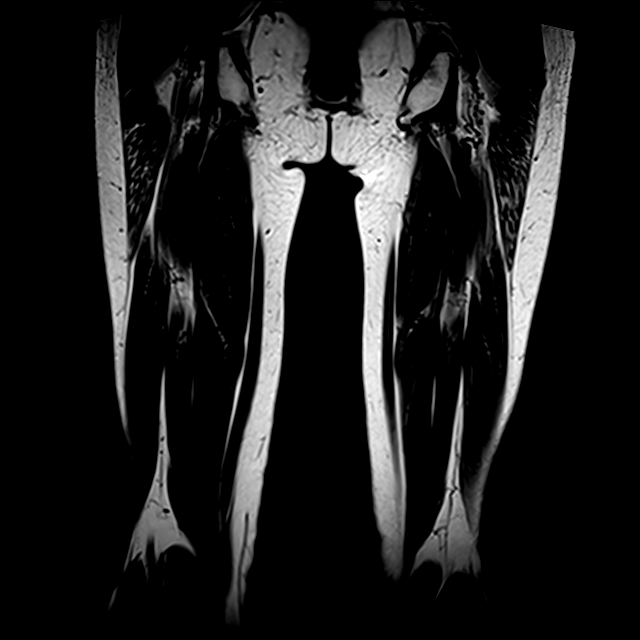
[im 12/23]
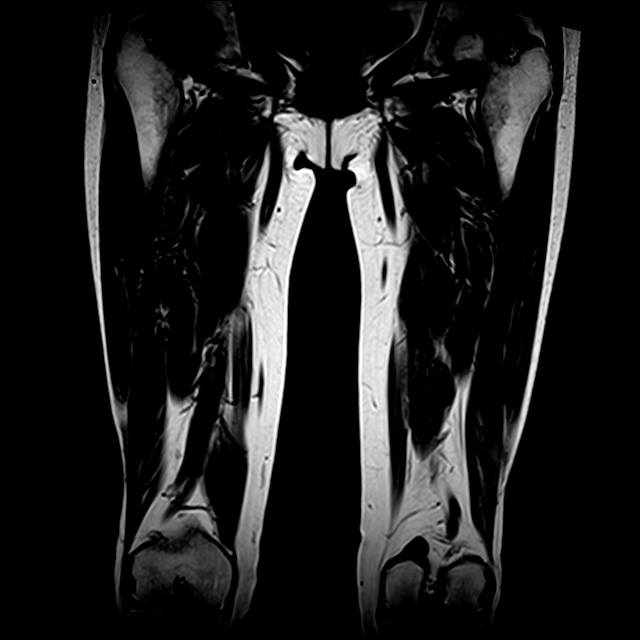
[im 17/23]
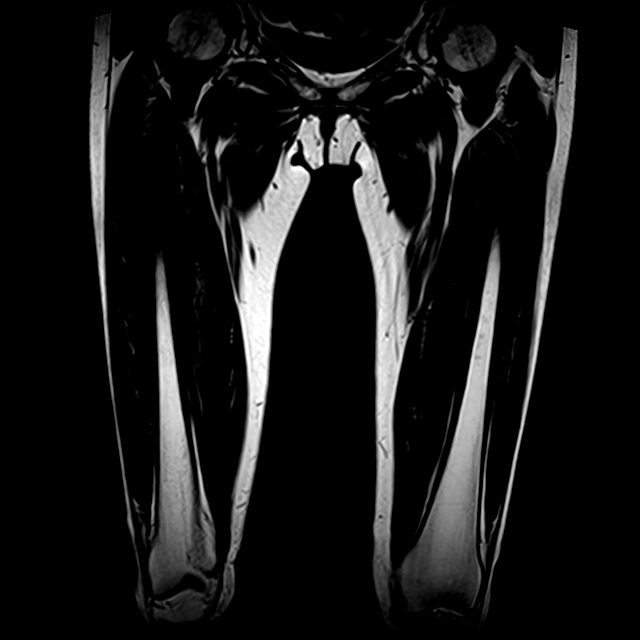
[im 23/23]
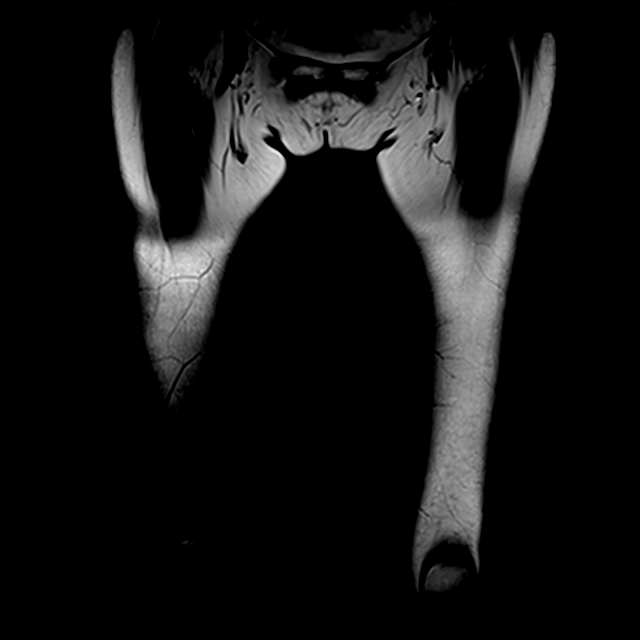

[Series 8: STIR · coronal · 5.0mm · 1.34mm/px · 3 of 19 slices shown]
[im 1/19]
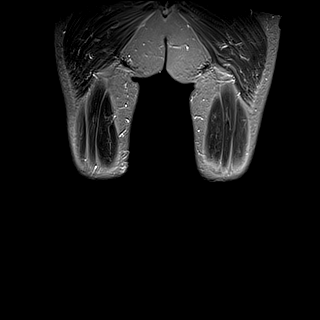
[im 7/19]
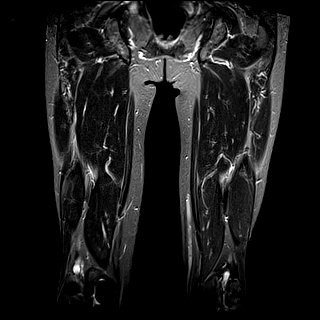
[im 13/19]
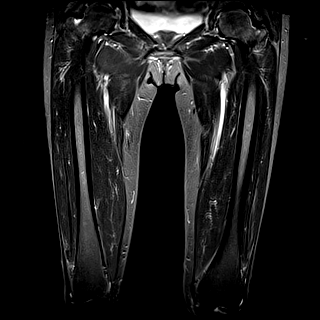

[32 of 40 positions shown; findings below may reference images not displayed]

FINDINGS: The femur is intact. No stress fracture or bone lesion. No marrow
edema.

Both hips are normally located. Mild bilateral hip joint
degenerative changes. No stress fracture or AVN.

Edema like signal abnormality in the lateral aspect of the biceps
femoris muscle consistent with a muscle strain or partial tear. This
may explain the patient's thigh pain. The other thigh muscles appear
normal and symmetric bilaterally.
IMPRESSION: Biceps femoris muscle strain/partial tear.

## 2019-01-02 DIAGNOSIS — H52203 Unspecified astigmatism, bilateral: Secondary | ICD-10-CM | POA: Diagnosis not present

## 2019-01-02 DIAGNOSIS — H532 Diplopia: Secondary | ICD-10-CM | POA: Diagnosis not present

## 2019-01-02 DIAGNOSIS — H04123 Dry eye syndrome of bilateral lacrimal glands: Secondary | ICD-10-CM | POA: Diagnosis not present

## 2019-01-02 DIAGNOSIS — H25812 Combined forms of age-related cataract, left eye: Secondary | ICD-10-CM | POA: Diagnosis not present

## 2019-01-28 DIAGNOSIS — H268 Other specified cataract: Secondary | ICD-10-CM | POA: Diagnosis not present

## 2019-01-28 DIAGNOSIS — H52202 Unspecified astigmatism, left eye: Secondary | ICD-10-CM | POA: Diagnosis not present

## 2019-01-28 DIAGNOSIS — H25812 Combined forms of age-related cataract, left eye: Secondary | ICD-10-CM | POA: Diagnosis not present

## 2019-03-04 ENCOUNTER — Other Ambulatory Visit: Payer: Medicare Other

## 2019-03-04 ENCOUNTER — Ambulatory Visit: Payer: Medicare Other

## 2019-03-07 ENCOUNTER — Ambulatory Visit
Admission: RE | Admit: 2019-03-07 | Discharge: 2019-03-07 | Disposition: A | Discharge status: Home / Self Care | Payer: Medicare Other | Source: Ambulatory Visit | Attending: Family Medicine | Admitting: Family Medicine

## 2019-03-07 ENCOUNTER — Other Ambulatory Visit: Payer: Self-pay

## 2019-03-07 DIAGNOSIS — E2839 Other primary ovarian failure: Secondary | ICD-10-CM

## 2019-03-07 DIAGNOSIS — Z471 Aftercare following joint replacement surgery: Secondary | ICD-10-CM | POA: Diagnosis not present

## 2019-03-07 DIAGNOSIS — Z96652 Presence of left artificial knee joint: Secondary | ICD-10-CM | POA: Diagnosis not present

## 2019-03-07 DIAGNOSIS — Z1231 Encounter for screening mammogram for malignant neoplasm of breast: Secondary | ICD-10-CM

## 2019-03-11 ENCOUNTER — Other Ambulatory Visit: Payer: Self-pay

## 2019-03-11 DIAGNOSIS — Z23 Encounter for immunization: Secondary | ICD-10-CM | POA: Diagnosis not present

## 2019-03-12 ENCOUNTER — Encounter: Payer: Self-pay | Admitting: Family Medicine

## 2019-03-12 ENCOUNTER — Ambulatory Visit (INDEPENDENT_AMBULATORY_CARE_PROVIDER_SITE_OTHER): Payer: Medicare Other | Admitting: Family Medicine

## 2019-03-12 VITALS — BP 130/78 | HR 78 | Temp 98.0°F | Ht 62.0 in | Wt 119.1 lb

## 2019-03-12 DIAGNOSIS — M810 Age-related osteoporosis without current pathological fracture: Secondary | ICD-10-CM

## 2019-03-12 DIAGNOSIS — L700 Acne vulgaris: Secondary | ICD-10-CM | POA: Diagnosis not present

## 2019-03-12 DIAGNOSIS — L811 Chloasma: Secondary | ICD-10-CM

## 2019-03-12 NOTE — Progress Notes (Signed)
Cassidy Bennett DOB: [birth year] Encounter date: 03/12/2019  This is a 79 y.o. female who presents with Chief Complaint  Patient presents with  . patient complains of black spot on her back x5 years    History of present illness:  Spot is itching. Started itching about 6 months ago but she had knee and cataract surgery.   Doesn't think getting larger.   Has been on Prolia since 08/2016. Recent DEXA reviewed and is stable-slightly improved at hip.   Allergies  Allergen Reactions  . Morphine And Related Nausea And Vomiting   Current Meds  Medication Sig  . Ascorbic Acid (VITAMIN C PO) Take 1,000 mg by mouth daily.   Marland Kitchen atenolol (TENORMIN) 25 MG tablet Take 1 tablet (25 mg total) by mouth daily.  Marland Kitchen b complex vitamins tablet Take 1 tablet by mouth daily.  . Calcium Carb-Cholecalciferol (CALCIUM 600 + D PO) Take 1 tablet by mouth daily.  . Cholecalciferol (VITAMIN D PO) Take 1,000 Units by mouth daily.   Marland Kitchen gabapentin (NEURONTIN) 100 MG capsule Take 2 capsules (200 mg total) by mouth 3 (three) times daily. Take two 100 mg capsules three times a day for two weeks following surgery.Then take two 100 mg capsules two times a day for two weeks. Then take two 100 mg capsules once a day for two weeks. Then discontinue the Gabapentin.  Marland Kitchen HYDROcodone-acetaminophen (NORCO/VICODIN) 5-325 MG tablet Take 1-2 tablets by mouth every 6 (six) hours as needed for moderate pain.  Marland Kitchen loratadine (CLARITIN) 10 MG tablet Take 10 mg by mouth daily.   . magnesium 30 MG tablet Take 30 mg by mouth 3 (three) times a week.   . methocarbamol (ROBAXIN) 500 MG tablet Take 1 tablet (500 mg total) by mouth every 6 (six) hours as needed for muscle spasms.  . polyethylene glycol (MIRALAX / GLYCOLAX) packet Take 17 g by mouth daily as needed for moderate constipation.   . traMADol (ULTRAM) 50 MG tablet Take 1-2 tablets (50-100 mg total) by mouth every 6 (six) hours as needed for moderate pain.  . Vitamin A 2400 MCG (8000  UT) TABS Take 8,000 Units by mouth daily.   . vitamin B-12 (CYANOCOBALAMIN) 1000 MCG tablet Take 1,000 mcg by mouth daily.  . vitamin E 400 UNIT capsule Take 400 Units by mouth daily.    Review of Systems  Constitutional: Negative for chills, fatigue and fever.  Respiratory: Negative for cough, chest tightness, shortness of breath and wheezing.   Cardiovascular: Negative for chest pain, palpitations and leg swelling.  Musculoskeletal: Positive for arthralgias (still has pain in left knee s/p replacement; improving but gradual. swelling on and off).    Objective:  BP 130/78 (BP Location: Right Arm, Patient Position: Sitting, Cuff Size: Normal)   Pulse 78   Temp 98 F (36.7 C) (Temporal)   Ht 5\' 2"  (1.575 m)   Wt 119 lb 1.6 oz (54 kg)   BMI 21.78 kg/m   Weight: 119 lb 1.6 oz (54 kg)   BP Readings from Last 3 Encounters:  03/12/19 130/78  11/27/18 120/80  08/14/18 (!) 176/70   Wt Readings from Last 3 Encounters:  03/12/19 119 lb 1.6 oz (54 kg)  11/27/18 115 lb (52.2 kg)  08/12/18 117 lb (53.1 kg)    Physical Exam Vitals reviewed.  Constitutional:      Appearance: Normal appearance.  HENT:     Head: Normocephalic and atraumatic.  Pulmonary:     Effort: Pulmonary effort is normal.  Skin:    Comments: Well healed scar left knee.   Mid back - enlarge open comedone easily expressed. No other concerning skin lesions/moles noted on full body skin check. She does have some hyperpigmentation scattered bilat cheeks. No inflammation, erythema.  Neurological:     Mental Status: She is alert.  Psychiatric:        Mood and Affect: Mood normal.        Behavior: Behavior normal.     Assessment/Plan  1. Osteoporosis without current pathological fracture, unspecified osteoporosis type Continue with prolia injections. Plan to repeat DEXA 02/2021. I suspect with return to weight bearing exercise and continued vitamin d/Ca that her bone density will improve.   2. Melasma -  Ambulatory referral to Dermatology  3. Open comedone - expressed fully in office today; patient tolerated well. No further eval unless recurrence.   Return in about 6 months (around 09/09/2019) for Chronic condition visit/physical; consider AWV with nurse in meanwhile.    Theodis Shove, MD

## 2019-03-19 ENCOUNTER — Telehealth: Payer: Self-pay | Admitting: Family Medicine

## 2019-03-19 DIAGNOSIS — M25662 Stiffness of left knee, not elsewhere classified: Secondary | ICD-10-CM | POA: Diagnosis not present

## 2019-03-19 DIAGNOSIS — M6281 Muscle weakness (generalized): Secondary | ICD-10-CM | POA: Diagnosis not present

## 2019-03-19 NOTE — Telephone Encounter (Signed)
Left message for patient to call back and schedule Medicare Annual Wellness Visit (AWV) either virtually OR audio only. Whichever the patient prefers--45 MINUTES  Last AWV 1.15.20; please schedule at anytime with [facility]-Nurse Health Advisor at [facility] [place].  Ok for [facility] to schedule

## 2019-03-26 DIAGNOSIS — M6281 Muscle weakness (generalized): Secondary | ICD-10-CM | POA: Diagnosis not present

## 2019-03-26 DIAGNOSIS — M25662 Stiffness of left knee, not elsewhere classified: Secondary | ICD-10-CM | POA: Diagnosis not present

## 2019-03-28 DIAGNOSIS — M6281 Muscle weakness (generalized): Secondary | ICD-10-CM | POA: Diagnosis not present

## 2019-03-28 DIAGNOSIS — M25662 Stiffness of left knee, not elsewhere classified: Secondary | ICD-10-CM | POA: Diagnosis not present

## 2019-03-31 DIAGNOSIS — L811 Chloasma: Secondary | ICD-10-CM | POA: Diagnosis not present

## 2019-03-31 DIAGNOSIS — L819 Disorder of pigmentation, unspecified: Secondary | ICD-10-CM | POA: Diagnosis not present

## 2019-04-02 DIAGNOSIS — M6281 Muscle weakness (generalized): Secondary | ICD-10-CM | POA: Diagnosis not present

## 2019-04-02 DIAGNOSIS — M25662 Stiffness of left knee, not elsewhere classified: Secondary | ICD-10-CM | POA: Diagnosis not present

## 2019-04-04 DIAGNOSIS — M6281 Muscle weakness (generalized): Secondary | ICD-10-CM | POA: Diagnosis not present

## 2019-04-04 DIAGNOSIS — M25662 Stiffness of left knee, not elsewhere classified: Secondary | ICD-10-CM | POA: Diagnosis not present

## 2019-04-09 DIAGNOSIS — M6281 Muscle weakness (generalized): Secondary | ICD-10-CM | POA: Diagnosis not present

## 2019-04-09 DIAGNOSIS — M25662 Stiffness of left knee, not elsewhere classified: Secondary | ICD-10-CM | POA: Diagnosis not present

## 2019-04-16 DIAGNOSIS — M25662 Stiffness of left knee, not elsewhere classified: Secondary | ICD-10-CM | POA: Diagnosis not present

## 2019-04-16 DIAGNOSIS — M6281 Muscle weakness (generalized): Secondary | ICD-10-CM | POA: Diagnosis not present

## 2019-04-18 DIAGNOSIS — M6281 Muscle weakness (generalized): Secondary | ICD-10-CM | POA: Diagnosis not present

## 2019-04-18 DIAGNOSIS — M25662 Stiffness of left knee, not elsewhere classified: Secondary | ICD-10-CM | POA: Diagnosis not present

## 2019-04-23 DIAGNOSIS — M25662 Stiffness of left knee, not elsewhere classified: Secondary | ICD-10-CM | POA: Diagnosis not present

## 2019-04-23 DIAGNOSIS — M6281 Muscle weakness (generalized): Secondary | ICD-10-CM | POA: Diagnosis not present

## 2019-04-24 ENCOUNTER — Telehealth: Payer: Self-pay | Admitting: *Deleted

## 2019-04-24 NOTE — Telephone Encounter (Signed)
Insurance verification started for Prolia. Injection due after 05/14/19

## 2019-04-25 DIAGNOSIS — Z23 Encounter for immunization: Secondary | ICD-10-CM | POA: Diagnosis not present

## 2019-04-28 DIAGNOSIS — M25662 Stiffness of left knee, not elsewhere classified: Secondary | ICD-10-CM | POA: Diagnosis not present

## 2019-04-28 DIAGNOSIS — M6281 Muscle weakness (generalized): Secondary | ICD-10-CM | POA: Diagnosis not present

## 2019-05-01 NOTE — Telephone Encounter (Signed)
Patient is aware and a Prolia injection has been scheduled.

## 2019-05-01 NOTE — Telephone Encounter (Signed)
Left message on machine for patient If deductible is met, then no cost for Prolia injection.  Okay to schedule after 05/14/19  Spoke with patient and she is questioning if she is supposed to continue Prolia.  Office visit 03/12/19:  1. Osteoporosis without current pathological fracture, unspecified osteoporosis type Continue with prolia injections. Plan to repeat DEXA 02/2021. I suspect with return to weight bearing exercise and continued vitamin d/Ca that her bone density will improve.   Please advise

## 2019-05-01 NOTE — Telephone Encounter (Signed)
Yes she should continue. If she is tolerating injections; goal would be to continue with these until her repeat dexa in 02/2021 and at that time we will determine if we continue with prolia or switch to different medication. We don't want to just stop due to risk of fracture with stopping.

## 2019-05-02 DIAGNOSIS — Z96652 Presence of left artificial knee joint: Secondary | ICD-10-CM | POA: Diagnosis not present

## 2019-05-16 ENCOUNTER — Other Ambulatory Visit: Payer: Self-pay

## 2019-05-19 ENCOUNTER — Other Ambulatory Visit: Payer: Self-pay

## 2019-05-19 ENCOUNTER — Ambulatory Visit (INDEPENDENT_AMBULATORY_CARE_PROVIDER_SITE_OTHER): Payer: Medicare Other | Admitting: *Deleted

## 2019-05-19 DIAGNOSIS — M810 Age-related osteoporosis without current pathological fracture: Secondary | ICD-10-CM | POA: Diagnosis not present

## 2019-05-19 MED ORDER — DENOSUMAB 60 MG/ML ~~LOC~~ SOSY
60.0000 mg | PREFILLED_SYRINGE | Freq: Once | SUBCUTANEOUS | Status: AC
  Administered 2019-05-19: 60 mg via SUBCUTANEOUS

## 2019-05-19 NOTE — Progress Notes (Signed)
Per orders of Dr. Burchette, injection of Cyanocobalamin 1000mcg given by Verda Mehta A. Patient tolerated injection well.  

## 2019-09-10 ENCOUNTER — Ambulatory Visit (INDEPENDENT_AMBULATORY_CARE_PROVIDER_SITE_OTHER): Payer: Medicare Other

## 2019-09-10 DIAGNOSIS — Z0000 Encounter for general adult medical examination without abnormal findings: Secondary | ICD-10-CM

## 2019-09-10 NOTE — Patient Instructions (Addendum)
Cassidy Bennett , Thank you for taking time to come for your Medicare Wellness Visit. I appreciate your ongoing commitment to your health goals. Please review the following plan we discussed and let me know if I can assist you in the future.   Screening recommendations/referrals: Colonoscopy: Done 09/12/13 Mammogram: Done 03/07/19 Bone Density: Done 03/07/19 Recommended yearly ophthalmology/optometry visit for glaucoma screening and checkup Recommended yearly dental visit for hygiene and checkup  Vaccinations: Influenza vaccine: Up to date Pneumococcal vaccine: Up to date Tdap vaccine: Up to date Shingles vaccine: Shingrix discussed. Please contact your pharmacy for coverage information.    Covid-19:Completed 1/12 & 04/25/19  Advanced directives: Please bring a copy of your health care power of attorney and living will to the office at your convenience.  Conditions/risks identified:    Next appointment: Follow up in one year for your annual wellness visit    Preventive Care 65 Years and Older, Female Preventive care refers to lifestyle choices and visits with your health care provider that can promote health and wellness. What does preventive care include?  A yearly physical exam. This is also called an annual well check.  Dental exams once or twice a year.  Routine eye exams. Ask your health care provider how often you should have your eyes checked.  Personal lifestyle choices, including:  Daily care of your teeth and gums.  Regular physical activity.  Eating a healthy diet.  Avoiding tobacco and drug use.  Limiting alcohol use.  Practicing safe sex.  Taking low-dose aspirin every day.  Taking vitamin and mineral supplements as recommended by your health care provider. What happens during an annual well check? The services and screenings done by your health care provider during your annual well check will depend on your age, overall health, lifestyle risk factors, and  family history of disease. Counseling  Your health care provider may ask you questions about your:  Alcohol use.  Tobacco use.  Drug use.  Emotional well-being.  Home and relationship well-being.  Sexual activity.  Eating habits.  History of falls.  Memory and ability to understand (cognition).  Work and work Astronomer.  Reproductive health. Screening  You may have the following tests or measurements:  Height, weight, and BMI.  Blood pressure.  Lipid and cholesterol levels. These may be checked every 5 years, or more frequently if you are over 71 years old.  Skin check.  Lung cancer screening. You may have this screening every year starting at age 80 if you have a 30-pack-year history of smoking and currently smoke or have quit within the past 15 years.  Fecal occult blood test (FOBT) of the stool. You may have this test every year starting at age 80.  Flexible sigmoidoscopy or colonoscopy. You may have a sigmoidoscopy every 5 years or a colonoscopy every 10 years starting at age 80.  Hepatitis C blood test.  Hepatitis B blood test.  Sexually transmitted disease (STD) testing.  Diabetes screening. This is done by checking your blood sugar (glucose) after you have not eaten for a while (fasting). You may have this done every 1-3 years.  Bone density scan. This is done to screen for osteoporosis. You may have this done starting at age 24.  Mammogram. This may be done every 1-2 years. Talk to your health care provider about how often you should have regular mammograms. Talk with your health care provider about your test results, treatment options, and if necessary, the need for more tests. Vaccines  Your  health care provider may recommend certain vaccines, such as:  Influenza vaccine. This is recommended every year.  Tetanus, diphtheria, and acellular pertussis (Tdap, Td) vaccine. You may need a Td booster every 10 years.  Zoster vaccine. You may need this  after age 26.  Pneumococcal 13-valent conjugate (PCV13) vaccine. One dose is recommended after age 19.  Pneumococcal polysaccharide (PPSV23) vaccine. One dose is recommended after age 66. Talk to your health care provider about which screenings and vaccines you need and how often you need them. This information is not intended to replace advice given to you by your health care provider. Make sure you discuss any questions you have with your health care provider. Document Released: 03/12/2015 Document Revised: 11/03/2015 Document Reviewed: 12/15/2014 Elsevier Interactive Patient Education  2017 [place] Prevention in the Home Falls can cause injuries. They can happen to people of all ages. There are many things you can do to make your home safe and to help prevent falls. What can I do on the outside of my home?  Regularly fix the edges of walkways and driveways and fix any cracks.  Remove anything that might make you trip as you walk through a door, such as a raised step or threshold.  Trim any bushes or trees on the path to your home.  Use bright outdoor lighting.  Clear any walking paths of anything that might make someone trip, such as rocks or tools.  Regularly check to see if handrails are loose or broken. Make sure that both sides of any steps have handrails.  Any raised decks and porches should have guardrails on the edges.  Have any leaves, snow, or ice cleared regularly.  Use sand or salt on walking paths during winter.  Clean up any spills in your garage right away. This includes oil or grease spills. What can I do in the bathroom?  Use night lights.  Install grab bars by the toilet and in the tub and shower. Do not use towel bars as grab bars.  Use non-skid mats or decals in the tub or shower.  If you need to sit down in the shower, use a plastic, non-slip stool.  Keep the floor dry. Clean up any water that spills on the floor as soon as it  happens.  Remove soap buildup in the tub or shower regularly.  Attach bath mats securely with double-sided non-slip rug tape.  Do not have throw rugs and other things on the floor that can make you trip. What can I do in the bedroom?  Use night lights.  Make sure that you have a light by your bed that is easy to reach.  Do not use any sheets or blankets that are too big for your bed. They should not hang down onto the floor.  Have a firm chair that has side arms. You can use this for support while you get dressed.  Do not have throw rugs and other things on the floor that can make you trip. What can I do in the kitchen?  Clean up any spills right away.  Avoid walking on wet floors.  Keep items that you use a lot in easy-to-reach places.  If you need to reach something above you, use a strong step stool that has a grab bar.  Keep electrical cords out of the way.  Do not use floor polish or wax that makes floors slippery. If you must use wax, use non-skid floor wax.  Do not  have throw rugs and other things on the floor that can make you trip. What can I do with my stairs?  Do not leave any items on the stairs.  Make sure that there are handrails on both sides of the stairs and use them. Fix handrails that are broken or loose. Make sure that handrails are as long as the stairways.  Check any carpeting to make sure that it is firmly attached to the stairs. Fix any carpet that is loose or worn.  Avoid having throw rugs at the top or bottom of the stairs. If you do have throw rugs, attach them to the floor with carpet tape.  Make sure that you have a light switch at the top of the stairs and the bottom of the stairs. If you do not have them, ask someone to add them for you. What else can I do to help prevent falls?  Wear shoes that:  Do not have high heels.  Have rubber bottoms.  Are comfortable and fit you well.  Are closed at the toe. Do not wear sandals.  If you  use a stepladder:  Make sure that it is fully opened. Do not climb a closed stepladder.  Make sure that both sides of the stepladder are locked into place.  Ask someone to hold it for you, if possible.  Clearly mark and make sure that you can see:  Any grab bars or handrails.  First and last steps.  Where the edge of each step is.  Use tools that help you move around (mobility aids) if they are needed. These include:  Canes.  Walkers.  Scooters.  Crutches.  Turn on the lights when you go into a dark area. Replace any light bulbs as soon as they burn out.  Set up your furniture so you have a clear path. Avoid moving your furniture around.  If any of your floors are uneven, fix them.  If there are any pets around you, be aware of where they are.  Review your medicines with your doctor. Some medicines can make you feel dizzy. This can increase your chance of falling. Ask your doctor what other things that you can do to help prevent falls. This information is not intended to replace advice given to you by your health care provider. Make sure you discuss any questions you have with your health care provider. Document Released: 12/10/2008 Document Revised: 07/22/2015 Document Reviewed: 03/20/2014 Elsevier Interactive Patient Education  2017 [employer].

## 2019-09-10 NOTE — Progress Notes (Signed)
Virtual Visit via Telephone Note  I connected with  Cassidy Bennett on 09/10/19 at  1:15 PM EDT by telephone and verified that I am speaking with the correct person using two identifiers.  Medicare Annual Wellness visit completed telephonically due to Covid-19 pandemic.   Persons participating in this call: This Health Coach and this patient.   Location: Patient: Home Provider: Office   I discussed the limitations, risks, security and privacy concerns of performing an evaluation and management service by telephone and the availability of in person appointments. The patient expressed understanding and agreed to proceed.  Unable to perform video visit due to video visit attempted and failed and/or patient does not have video capability.   Some vital signs may be absent or patient reported.   Marzella Schlein, LPN    Subjective:   Cassidy Bennett is a 79 y.o. female who presents for Medicare Annual (Subsequent) preventive examination.  Review of Systems     Cardiac Risk Factors include: advanced age (>96men, >64 women);dyslipidemia     Objective:    There were no vitals filed for this visit. There is no height or weight on file to calculate BMI.  Advanced Directives 09/10/2019 08/12/2018 08/07/2018 05/14/2018 03/13/2018 04/19/2017 04/15/2017  Does Patient Have a Medical Advance Directive? Yes Yes Yes Yes Yes No No  Type of Advance Directive Living will Living will;Healthcare Power of Attorney Living will;Healthcare Power of [employer] Power of [place];Living will Healthcare Power of [place];Living will - -  Does patient want to make changes to medical advance directive? - No - Patient declined No - Patient declined No - Patient declined No - Patient declined - -  Copy of Healthcare Power of Attorney in Chart? - No - copy requested No - copy requested No - copy requested No - copy requested - -  Would patient like information on creating a medical advance directive?  - - - - - No - Patient declined -  Pre-existing out of facility DNR order (yellow form or pink MOST form) - - - - - - -    Current Medications (verified) Outpatient Encounter Medications as of 09/10/2019  Medication Sig  . Ascorbic Acid (VITAMIN C PO) Take 1,000 mg by mouth daily.   Marland Kitchen atenolol (TENORMIN) 25 MG tablet Take 1 tablet (25 mg total) by mouth daily.  Marland Kitchen b complex vitamins tablet Take 1 tablet by mouth daily.  . Calcium Carb-Cholecalciferol (CALCIUM 600 + D PO) Take 1 tablet by mouth daily.  . Cholecalciferol (VITAMIN D PO) Take 1,000 Units by mouth daily.   Marland Kitchen loratadine (CLARITIN) 10 MG tablet Take 10 mg by mouth daily.   . magnesium 30 MG tablet Take 30 mg by mouth 3 (three) times a week.   . polyethylene glycol (MIRALAX / GLYCOLAX) packet Take 17 g by mouth daily as needed for moderate constipation.   . Vitamin A 2400 MCG (8000 UT) TABS Take 8,000 Units by mouth daily.   . vitamin B-12 (CYANOCOBALAMIN) 1000 MCG tablet Take 1,000 mcg by mouth daily.  . vitamin E 400 UNIT capsule Take 400 Units by mouth daily.  . [DISCONTINUED] gabapentin (NEURONTIN) 100 MG capsule Take 2 capsules (200 mg total) by mouth 3 (three) times daily. Take two 100 mg capsules three times a day for two weeks following surgery.Then take two 100 mg capsules two times a day for two weeks. Then take two 100 mg capsules once a day for two weeks. Then discontinue the Gabapentin. (Patient  not taking: Reported on 09/10/2019)  . [DISCONTINUED] HYDROcodone-acetaminophen (NORCO/VICODIN) 5-325 MG tablet Take 1-2 tablets by mouth every 6 (six) hours as needed for moderate pain. (Patient not taking: Reported on 09/10/2019)  . [DISCONTINUED] methocarbamol (ROBAXIN) 500 MG tablet Take 1 tablet (500 mg total) by mouth every 6 (six) hours as needed for muscle spasms. (Patient not taking: Reported on 09/10/2019)  . [DISCONTINUED] traMADol (ULTRAM) 50 MG tablet Take 1-2 tablets (50-100 mg total) by mouth every 6 (six) hours as needed  for moderate pain. (Patient not taking: Reported on 09/10/2019)   No facility-administered encounter medications on file as of 09/10/2019.    Allergies (verified) Morphine and related   History: Past Medical History:  Diagnosis Date  . Allergy   . Complication of anesthesia   . History of TB (tuberculosis)    treated 45 plus years ago last cxr 03-15-18  CXR every 5 years  . Hypercholesteremia    denies at preop  . Hypertension   . Osteoarthritis   . Osteoporosis   . PONV (postoperative nausea and vomiting)   . Post-menopausal   . Tuberculosis    Past Surgical History:  Procedure Laterality Date  . CATARACT EXTRACTION  2018   right eye  . COLONOSCOPY  2015  . DILATION AND CURETTAGE OF UTERUS  2000  . KNEE ARTHROSCOPY  2014   left  . LUMBAR DISC SURGERY    . SHOULDER SURGERY     right  . 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 Other Father        malaria  . Cancer Other        stomach   Social History   Socioeconomic History  . Marital status: Married    Spouse name: Not on file  . Number of children: 3  . Years of education: Not on file  . Highest education level: Not on file  Occupational History  . Occupation: Retired school principal  Tobacco Use  . Smoking status: Never Smoker  . Smokeless tobacco: Never Used  Vaping Use  . Vaping Use: Never used  Substance and Sexual Activity  . Alcohol use: Not Currently    Alcohol/week: 5.0 standard drinks    Types: 5 Glasses of wine per week  . Drug use: No  . Sexual activity: Not Currently  Other Topics Concern  . Not on file  Social History Narrative   ** Merged History Encounter **       Lives with husband in two-story home, has 3 sons and 5 grandchildren in [place]  Active with volunteering at school.  Enjoys  gardening and using stationary bike to cycle     Social Determinants of Health   Financial Resource Strain: Low Risk   . Difficulty of Paying Living Expenses: Not hard at all  Food Insecurity: No Food Insecurity  . Worried About Programme researcher, broadcasting/film/video in the Last Year: Never true  . Ran Out of Food in the Last Year: Never true  Transportation Needs: No Transportation Needs  . Lack of Transportation (Medical): No  . Lack of Transportation (Non-Medical): No  Physical Activity: Insufficiently Active  . Days of Exercise per Week: 3 days  . Minutes of Exercise per Session: 30 min  Stress: No Stress Concern Present  . Feeling of Stress : Not  at all  Social Connections: Socially Integrated  . Frequency of Communication with Friends and Family: More than three times a week  . Frequency of Social Gatherings with Friends and Family: More than three times a week  . Attends Religious Services: More than 4 times per year  . Active Member of Clubs or Organizations: Yes  . Attends Banker Meetings: 1 to 4 times per year  . Marital Status: Married    Tobacco Counseling Counseling given: Not Answered   Clinical Intake:  Pre-visit preparation completed: Yes  Pain : No/denies pain     BMI - recorded: 21.78 Nutritional Status: BMI of 19-24  Normal Nutritional Risks: None  How often do you need to have someone help you when you read instructions, pamphlets, or other written materials from your doctor or pharmacy?: 1 - Never  Diabetic?No  Interpreter Needed?: No  Information entered by :: Lanier Ensign, LPN   Activities of Daily Living In your present state of health, do you have any difficulty performing the following activities: 09/10/2019  Hearing? N  Vision? N  Difficulty concentrating or making decisions? N  Walking or climbing stairs? N  Dressing or bathing? N  Doing errands, shopping? N  Preparing Food and eating ? N  Using the Toilet? N  In the past six  months, have you accidently leaked urine? N  Do you have problems with loss of bowel control? N  Managing your Medications? N  Managing your Finances? N  Housekeeping or managing your Housekeeping? N  Some recent data might be hidden    Patient Care Team: Wynn Banker, MD as PCP - General (Family Medicine) Mckinley Jewel, MD as Consulting Physician (Ophthalmology)  Indicate any recent Medical Services you may have received from other than [facility] providers in the past year (date may be approximate).     Assessment:   This is a routine wellness examination for [place].  Hearing/Vision screen  Hearing Screening   125Hz  250Hz  500Hz  1000Hz  2000Hz  3000Hz  4000Hz  6000Hz  8000Hz   Right ear:           Left ear:           Comments: Pt denies any difficulty hearing at this time  Vision Screening Comments: Will follow up annually with dr and patient has had cataract surgery  Dietary issues and exercise activities discussed: Current Exercise Habits: Home exercise routine, Type of exercise: walking;Other - see comments (exercise and water aeroics and swimming lessons), Time (Minutes): 30, Frequency (Times/Week): 3, Weekly Exercise (Minutes/Week): 90, Intensity: Moderate, Exercise limited by: orthopedic condition(s);cardiac condition(s)  Goals    . Patient Stated     Continue staying physically and mentally active, engaged with your volunteer work!     . Patient Stated     Patient will stay healthy      Depression Screen PHQ 2/9 Scores 09/10/2019 03/13/2018 09/07/2017 09/05/2016 08/27/2015 12/14/2014 06/26/2013  PHQ - 2 Score 0 0 0 0 0 0 0  PHQ- 9 Score - 0 - - - - -    Fall Risk Fall Risk  09/10/2019 03/13/2018 09/07/2017 09/05/2016 08/27/2015  Falls in the past year? 0 0 No No No  Number falls in past yr: 0 - - - -  Injury with Fall? 0 - - - -  Risk for fall due to : Impaired vision Other (Comment) - - -  Risk for fall due to: Comment - L knee pain - - -  Follow up  Falls prevention  discussed Education provided;Falls prevention discussed - - -    Any stairs in or around the home? Yes  If so, are there any without handrails? No  Home free of loose throw rugs in walkways, pet beds, electrical cords, etc? Yes  Adequate lighting in your home to reduce risk of falls? Yes   ASSISTIVE DEVICES UTILIZED TO PREVENT FALLS:  Life alert? No  Use of a cane, walker or w/c? No  Grab bars in the bathroom? No  Shower chair or bench in shower? Yes  Elevated toilet seat or a handicapped toilet? No   TIMED UP AND GO:  Was the test performed? No .      Cognitive Function:     6CIT Screen 09/10/2019  What Year? 0 points  What month? 0 points  What time? 0 points  Count back from 20 0 points  Months in reverse 0 points  Repeat phrase 0 points  Total Score 0    Immunizations Immunization History  Administered Date(s) Administered  . Fluad Quad(high Dose 65+) 11/27/2018  . Hepatitis A 05/04/2017, 09/07/2017  . Hepatitis A, Adult 05/03/2017  . Influenza Inj Mdck Quad With Preservative 12/27/2017  . Influenza Split 11/16/2010  . Influenza Whole 12/13/2006, 02/25/2009  . Influenza, High Dose Seasonal PF 10/28/2017  . Influenza,inj,quad, With Preservative 12/26/2016, 12/27/2017  . Influenza-Unspecified 12/29/2014, 11/09/2016, 12/26/2016, 12/27/2016  . Moderna SARS-COVID-2 Vaccination 03/11/2019, 04/25/2019  . Pneumococcal Conjugate-13 06/26/2013  . Pneumococcal Polysaccharide-23 02/27/2005, 06/17/2012  . Td 02/27/1997, 01/22/2008, 05/04/2017  . Tdap 09/07/2016, 05/04/2017    TDAP status: Up to date Flu Vaccine status: Up to date Pneumococcal vaccine status: Up to date Covid-19 vaccine status: Completed vaccines  Qualifies for Shingles Vaccine? No   Zostavax completed No   Shingrix Completed?: No.    Education has been provided regarding the importance of this vaccine. Patient has been advised to call insurance company to determine out of pocket  expense if they have not yet received this vaccine. Advised may also receive vaccine at local pharmacy or Health Dept. Verbalized acceptance and understanding.  Screening Tests Health Maintenance  Topic Date Due  . Hepatitis C Screening  Never done  . INFLUENZA VACCINE  09/28/2019  . TETANUS/TDAP  05/05/2027  . DEXA SCAN  Completed  . COVID-19 Vaccine  Completed  . PNA vac Low Risk Adult  Completed    Health Maintenance  Health Maintenance Due  Topic Date Due  . Hepatitis C Screening  Never done    Colorectal cancer screening: Completed 09/12/13. Repeat every 10 years Mammogram status: Completed 03/07/19. Repeat every year Bone Density status: Completed 03/07/19. Results reflect: Bone density results: OSTEOPOROSIS. Repeat every 2 years.   Additional Screening:  Hepatitis C Screening: does  Vision Screening: Recommended annual ophthalmology exams for early detection of glaucoma and other disorders of the eye. Is the patient up to date with their annual eye exam?  Yes  Who is the provider or what is the name of the office in which the patient attends annual eye exams? Dr [facility]oberto [facility]   Dental Screening: Recommended annual dental exams for proper oral hygiene  Community Resource Referral / Chronic Care Management: CRR required this visit?  No   CCM required this visit?  No      Plan:     I have personally reviewed and noted the following in the patient's chart:   . Medical and social history . Use of alcohol, tobacco or illicit drugs  . Current medications and supplements .  Functional ability and status . Nutritional status . Physical activity . Advanced directives . List of other physicians . Hospitalizations, surgeries, and ER visits in previous 12 months . Vitals . Screenings to include cognitive, depression, and falls . Referrals and appointments  In addition, I have reviewed and discussed with patient certain preventive protocols, quality metrics, and best  practice recommendations. A written personalized care plan for preventive services as well as general preventive health recommendations were provided to patient.     Marzella Schlein, LPN   [id number]   Nurse Notes: None

## 2019-09-11 ENCOUNTER — Ambulatory Visit (INDEPENDENT_AMBULATORY_CARE_PROVIDER_SITE_OTHER): Payer: Medicare Other | Admitting: Family Medicine

## 2019-09-11 ENCOUNTER — Encounter: Payer: Self-pay | Admitting: Family Medicine

## 2019-09-11 ENCOUNTER — Other Ambulatory Visit: Payer: Self-pay

## 2019-09-11 VITALS — BP 144/90 | HR 68 | Temp 98.2°F | Ht 62.0 in | Wt 117.0 lb

## 2019-09-11 DIAGNOSIS — I10 Essential (primary) hypertension: Secondary | ICD-10-CM

## 2019-09-11 DIAGNOSIS — J302 Other seasonal allergic rhinitis: Secondary | ICD-10-CM | POA: Diagnosis not present

## 2019-09-11 DIAGNOSIS — L819 Disorder of pigmentation, unspecified: Secondary | ICD-10-CM

## 2019-09-11 DIAGNOSIS — Z1159 Encounter for screening for other viral diseases: Secondary | ICD-10-CM | POA: Diagnosis not present

## 2019-09-11 DIAGNOSIS — R7301 Impaired fasting glucose: Secondary | ICD-10-CM

## 2019-09-11 DIAGNOSIS — M79641 Pain in right hand: Secondary | ICD-10-CM

## 2019-09-11 DIAGNOSIS — E782 Mixed hyperlipidemia: Secondary | ICD-10-CM | POA: Diagnosis not present

## 2019-09-11 DIAGNOSIS — M2550 Pain in unspecified joint: Secondary | ICD-10-CM | POA: Diagnosis not present

## 2019-09-11 DIAGNOSIS — M810 Age-related osteoporosis without current pathological fracture: Secondary | ICD-10-CM | POA: Diagnosis not present

## 2019-09-11 DIAGNOSIS — M79642 Pain in left hand: Secondary | ICD-10-CM

## 2019-09-11 MED ORDER — SHINGRIX 50 MCG/0.5ML IM SUSR
0.5000 mL | Freq: Once | INTRAMUSCULAR | 0 refills | Status: AC
Start: 2019-09-11 — End: 2019-09-11

## 2019-09-11 NOTE — Patient Instructions (Signed)
Check blood pressures daily at home and record them along with pulse. I will check in with you once I see bloodwork results.

## 2019-09-11 NOTE — Progress Notes (Signed)
Cassidy Bennett DOB: 1940-11-10 Encounter date: 09/11/2019  This is a 79 y.o. female who presents with Chief Complaint  Patient presents with  . Follow-up  . Rash    patient complains of "spots" bilateral hands and chest xMarch    History of present illness: Last visit 02/2019  Has some pain in knuckles on left and right hand. Always had some enlargement but has gotten larger and more tender in recent months. Does feel like they do swell sometimes as well. If using hands get more sore and more swollen. Other joints are not bothering her. Had follow up with ortho and was cleared for 2 year follow up. No pain in shoulders, hips, toes, but states that bottom of feet hurt sometimes but associates with specific tennis shoes.   Also has some white spots on chest, forearms. Not itchy. More pronounced with sun exposure - started in May.   Osteoporosis on prolia: due for repeat DEXA 02/2021.  HLK:TGYBWLSL; just came from working out. Has been checking at home - usually in the 140's (high in 150); sometimes in the 120.   Allergies: continues with claritin; has some throat clearing. Usually doesn't have reflux; just once in last few months.   Hyperlipidemia: diet controlled   Allergies  Allergen Reactions  . Metoprolol Nausea And Vomiting  . Morphine And Related Nausea And Vomiting   Current Meds  Medication Sig  . Ascorbic Acid (VITAMIN C PO) Take 1,000 mg by mouth daily.   Marland Kitchen atenolol (TENORMIN) 25 MG tablet Take 1 tablet (25 mg total) by mouth daily.  Marland Kitchen b complex vitamins tablet Take 1 tablet by mouth daily.  . Calcium Carb-Cholecalciferol (CALCIUM 600 + D PO) Take 1 tablet by mouth daily.  . Cholecalciferol (VITAMIN D PO) Take 1,000 Units by mouth daily.   Marland Kitchen loratadine (CLARITIN) 10 MG tablet Take 10 mg by mouth daily.   . magnesium 30 MG tablet Take 30 mg by mouth 3 (three) times a week.   . polyethylene glycol (MIRALAX / GLYCOLAX) packet Take 17 g by mouth daily as needed for  moderate constipation.   . Vitamin A 2400 MCG (8000 UT) TABS Take 8,000 Units by mouth daily.   . vitamin B-12 (CYANOCOBALAMIN) 1000 MCG tablet Take 1,000 mcg by mouth daily.  . vitamin E 400 UNIT capsule Take 400 Units by mouth daily.    Review of Systems  Objective:  BP (!) 144/90 (BP Location: Left Arm, Patient Position: Sitting, Cuff Size: Normal)   Pulse 68   Temp 98.2 F (36.8 C) (Oral)   Ht 5\' 2"  (1.575 m)   Wt 117 lb (53.1 kg)   BMI 21.40 kg/m   Weight: 117 lb (53.1 kg)   BP Readings from Last 3 Encounters:  09/11/19 (!) 144/90  03/12/19 130/78  11/27/18 120/80   Wt Readings from Last 3 Encounters:  09/11/19 117 lb (53.1 kg)  03/12/19 119 lb 1.6 oz (54 kg)  11/27/18 115 lb (52.2 kg)    Physical Exam Constitutional:      General: She is not in acute distress.    Appearance: She is well-developed.  Cardiovascular:     Rate and Rhythm: Normal rate and regular rhythm.     Heart sounds: Normal heart sounds. No murmur heard.  No friction rub.  Pulmonary:     Effort: Pulmonary effort is normal. No respiratory distress.     Breath sounds: Normal breath sounds. No wheezing or rales.  Musculoskeletal:  Right lower leg: No edema.     Left lower leg: No edema.  Neurological:     Mental Status: She is alert and oriented to person, place, and time.  Psychiatric:        Behavior: Behavior normal.     Assessment/Plan  1. Essential hypertension Elevated today in office; will check at home and we will touch base pending home report.  - CBC with Differential/Platelet; Future - Comprehensive metabolic panel; Future  2. Seasonal allergies Continue with claritin.   3. Mixed hyperlipidemia - Lipid panel; Future  4. Hypopigmentation Wondering if related to autoimmune process but if labwork negative consider topical anti-fungal.   5. Arthralgia, unspecified joint Follow up/treatment pending labwork.  - ANA; Future - C-reactive protein; Future - Sedimentation  rate; Future - Rheumatoid factor; Future - Uric acid; Future  6. Osteoporosis without current pathological fracture, unspecified osteoporosis type On prolio. - VITAMIN D 25 Hydroxy (Vit-D Deficiency, Fractures); Future  7. Impaired fasting glucose - Hemoglobin A1c; Future  8. Encounter for hepatitis C screening test for low risk patient - Hepatitis C antibody; Future   Return for pending labwork and blood pressure report.    Theodis Shove, MD

## 2019-09-11 NOTE — Addendum Note (Signed)
Addended by: Lerry Liner on: 09/11/2019 01:42 PM   Modules accepted: Orders

## 2019-09-12 ENCOUNTER — Ambulatory Visit: Payer: Medicare Other | Admitting: Family Medicine

## 2019-09-12 ENCOUNTER — Ambulatory Visit: Payer: Medicare Other

## 2019-09-12 DIAGNOSIS — H0100A Unspecified blepharitis right eye, upper and lower eyelids: Secondary | ICD-10-CM | POA: Diagnosis not present

## 2019-09-12 DIAGNOSIS — H16103 Unspecified superficial keratitis, bilateral: Secondary | ICD-10-CM | POA: Diagnosis not present

## 2019-09-12 DIAGNOSIS — H52203 Unspecified astigmatism, bilateral: Secondary | ICD-10-CM | POA: Diagnosis not present

## 2019-09-12 DIAGNOSIS — H04123 Dry eye syndrome of bilateral lacrimal glands: Secondary | ICD-10-CM | POA: Diagnosis not present

## 2019-09-15 LAB — COMPREHENSIVE METABOLIC PANEL
AG Ratio: 1.5 (calc) (ref 1.0–2.5)
ALT: 27 U/L (ref 6–29)
AST: 24 U/L (ref 10–35)
Albumin: 4.4 g/dL (ref 3.6–5.1)
Alkaline phosphatase (APISO): 42 U/L (ref 37–153)
BUN/Creatinine Ratio: 25 (calc) — ABNORMAL HIGH (ref 6–22)
BUN: 15 mg/dL (ref 7–25)
CO2: 23 mmol/L (ref 20–32)
Calcium: 9.2 mg/dL (ref 8.6–10.4)
Chloride: 104 mmol/L (ref 98–110)
Creat: 0.59 mg/dL — ABNORMAL LOW (ref 0.60–0.93)
Globulin: 2.9 g/dL (calc) (ref 1.9–3.7)
Glucose, Bld: 97 mg/dL (ref 65–99)
Potassium: 3.9 mmol/L (ref 3.5–5.3)
Sodium: 138 mmol/L (ref 135–146)
Total Bilirubin: 0.6 mg/dL (ref 0.2–1.2)
Total Protein: 7.3 g/dL (ref 6.1–8.1)

## 2019-09-15 LAB — CBC WITH DIFFERENTIAL/PLATELET
Absolute Monocytes: 375 cells/uL (ref 200–950)
Basophils Absolute: 39 cells/uL (ref 0–200)
Basophils Relative: 0.7 %
Eosinophils Absolute: 90 cells/uL (ref 15–500)
Eosinophils Relative: 1.6 %
HCT: 39.1 % (ref 35.0–45.0)
Hemoglobin: 13.1 g/dL (ref 11.7–15.5)
Lymphs Abs: 1876 cells/uL (ref 850–3900)
MCH: 27.2 pg (ref 27.0–33.0)
MCHC: 33.5 g/dL (ref 32.0–36.0)
MCV: 81.1 fL (ref 80.0–100.0)
MPV: 11.1 fL (ref 7.5–12.5)
Monocytes Relative: 6.7 %
Neutro Abs: 3220 cells/uL (ref 1500–7800)
Neutrophils Relative %: 57.5 %
Platelets: 164 10*3/uL (ref 140–400)
RBC: 4.82 10*6/uL (ref 3.80–5.10)
RDW: 14 % (ref 11.0–15.0)
Total Lymphocyte: 33.5 %
WBC: 5.6 10*3/uL (ref 3.8–10.8)

## 2019-09-15 LAB — ANTI-NUCLEAR AB-TITER (ANA TITER): ANA Titer 1: 1:40 {titer} — ABNORMAL HIGH

## 2019-09-15 LAB — HEMOGLOBIN A1C
Hgb A1c MFr Bld: 5.7 % of total Hgb — ABNORMAL HIGH (ref <5.7)
Mean Plasma Glucose: 117 (calc)
eAG (mmol/L): 6.5 (calc)

## 2019-09-15 LAB — RHEUMATOID FACTOR: Rheumatoid fact SerPl-aCnc: <14 IU/mL (ref <14)

## 2019-09-15 LAB — HEPATITIS C ANTIBODY
Hepatitis C Ab: NONREACTIVE
SIGNAL TO CUT-OFF: 0.01 (ref <1.00)

## 2019-09-15 LAB — LIPID PANEL
Cholesterol: 195 mg/dL (ref <200)
HDL: 47 mg/dL — ABNORMAL LOW (ref >=50)
Non-HDL Cholesterol (Calc): 148 mg/dL (calc) — ABNORMAL HIGH (ref <130)
Total CHOL/HDL Ratio: 4.1 (calc) (ref <5.0)
Triglycerides: 445 mg/dL — ABNORMAL HIGH (ref <150)

## 2019-09-15 LAB — SEDIMENTATION RATE: Sed Rate: 14 mm/h (ref 0–30)

## 2019-09-15 LAB — C-REACTIVE PROTEIN: CRP: 0.4 mg/L (ref <8.0)

## 2019-09-15 LAB — URIC ACID: Uric Acid, Serum: 4 mg/dL (ref 2.5–7.0)

## 2019-09-15 LAB — VITAMIN D 25 HYDROXY (VIT D DEFICIENCY, FRACTURES): Vit D, 25-Hydroxy: 45 ng/mL (ref 30–100)

## 2019-09-15 LAB — ANA: Anti Nuclear Antibody (ANA): POSITIVE — AB

## 2019-09-18 NOTE — Addendum Note (Signed)
Addended by: Johnella Moloney on: 09/18/2019 04:09 PM   Modules accepted: Orders

## 2019-09-19 ENCOUNTER — Other Ambulatory Visit: Payer: Self-pay

## 2019-09-19 ENCOUNTER — Ambulatory Visit (INDEPENDENT_AMBULATORY_CARE_PROVIDER_SITE_OTHER)
Admission: RE | Admit: 2019-09-19 | Discharge: 2019-09-19 | Disposition: A | Discharge status: Home / Self Care | Payer: Medicare Other | Source: Ambulatory Visit | Attending: Family Medicine | Admitting: Family Medicine

## 2019-09-19 DIAGNOSIS — M79642 Pain in left hand: Secondary | ICD-10-CM | POA: Diagnosis not present

## 2019-09-19 DIAGNOSIS — M79641 Pain in right hand: Secondary | ICD-10-CM

## 2019-09-19 DIAGNOSIS — M19041 Primary osteoarthritis, right hand: Secondary | ICD-10-CM | POA: Diagnosis not present

## 2019-09-23 ENCOUNTER — Telehealth: Payer: Self-pay | Admitting: Family Medicine

## 2019-09-23 ENCOUNTER — Other Ambulatory Visit: Payer: Self-pay | Admitting: *Deleted

## 2019-09-23 DIAGNOSIS — I10 Essential (primary) hypertension: Secondary | ICD-10-CM

## 2019-09-23 MED ORDER — ATENOLOL 25 MG PO TABS: 25.0000 mg | ORAL_TABLET | Freq: Every day | ORAL | 1 refills | Status: DC

## 2019-09-23 NOTE — Telephone Encounter (Signed)
See results note. 

## 2019-09-23 NOTE — Telephone Encounter (Signed)
Patient requested a refill on Atenolol and is aware the Rx was sent to [facility].

## 2019-09-23 NOTE — Telephone Encounter (Signed)
Pt returned call. She will be home all day  PT number [phone]

## 2019-09-26 ENCOUNTER — Other Ambulatory Visit: Payer: Self-pay | Admitting: Family Medicine

## 2019-09-26 MED ORDER — KETOCONAZOLE 2 % EX SHAM: 1.0000 "application " | MEDICATED_SHAMPOO | CUTANEOUS | 0 refills | Status: AC

## 2019-10-03 DIAGNOSIS — L816 Other disorders of diminished melanin formation: Secondary | ICD-10-CM | POA: Diagnosis not present

## 2019-10-03 DIAGNOSIS — L811 Chloasma: Secondary | ICD-10-CM | POA: Diagnosis not present

## 2019-10-23 ENCOUNTER — Other Ambulatory Visit: Payer: Self-pay

## 2019-10-24 ENCOUNTER — Other Ambulatory Visit (INDEPENDENT_AMBULATORY_CARE_PROVIDER_SITE_OTHER): Payer: Medicare Other

## 2019-10-24 DIAGNOSIS — E782 Mixed hyperlipidemia: Secondary | ICD-10-CM | POA: Diagnosis not present

## 2019-10-24 LAB — LIPID PANEL
Cholesterol: 196 mg/dL (ref <200)
HDL: 46 mg/dL — ABNORMAL LOW (ref >=50)
LDL Cholesterol (Calc): 105 mg/dL (calc) — ABNORMAL HIGH
Non-HDL Cholesterol (Calc): 150 mg/dL (calc) — ABNORMAL HIGH (ref <130)
Total CHOL/HDL Ratio: 4.3 (calc) (ref <5.0)
Triglycerides: 338 mg/dL — ABNORMAL HIGH (ref <150)

## 2019-10-29 ENCOUNTER — Other Ambulatory Visit: Payer: Self-pay | Admitting: Family Medicine

## 2019-10-29 DIAGNOSIS — E782 Mixed hyperlipidemia: Secondary | ICD-10-CM

## 2019-10-29 MED ORDER — FENOFIBRATE 48 MG PO TABS: 48.0000 mg | ORAL_TABLET | Freq: Every day | ORAL | 1 refills | Status: DC

## 2019-11-06 ENCOUNTER — Telehealth: Payer: Self-pay | Admitting: *Deleted

## 2019-11-06 NOTE — Telephone Encounter (Signed)
Insurance verification started 11/06/19. Patient's Prolia injection due after 11/20/19.

## 2019-11-11 NOTE — Telephone Encounter (Signed)
Scheduled for 11/21/19

## 2019-11-21 ENCOUNTER — Ambulatory Visit (INDEPENDENT_AMBULATORY_CARE_PROVIDER_SITE_OTHER): Payer: Medicare Other

## 2019-11-21 ENCOUNTER — Other Ambulatory Visit: Payer: Self-pay

## 2019-11-21 DIAGNOSIS — Z23 Encounter for immunization: Secondary | ICD-10-CM | POA: Diagnosis not present

## 2019-11-21 DIAGNOSIS — M810 Age-related osteoporosis without current pathological fracture: Secondary | ICD-10-CM | POA: Diagnosis not present

## 2019-11-21 MED ORDER — DENOSUMAB 60 MG/ML ~~LOC~~ SOSY: 60.0000 mg | PREFILLED_SYRINGE | Freq: Once | SUBCUTANEOUS | 0 refills | Status: DC

## 2019-11-21 MED ORDER — DENOSUMAB 60 MG/ML ~~LOC~~ SOSY
60.0000 mg | PREFILLED_SYRINGE | Freq: Once | SUBCUTANEOUS | Status: AC
  Administered 2019-11-21: 60 mg via SUBCUTANEOUS

## 2019-11-21 NOTE — Progress Notes (Signed)
Per orders of Dr. Hassan Rowan, injection of Prolia given by Sherrin Daisy. Patient tolerated injection well.

## 2020-01-30 ENCOUNTER — Other Ambulatory Visit (INDEPENDENT_AMBULATORY_CARE_PROVIDER_SITE_OTHER): Payer: Medicare Other

## 2020-01-30 ENCOUNTER — Other Ambulatory Visit: Payer: Self-pay

## 2020-01-30 DIAGNOSIS — E782 Mixed hyperlipidemia: Secondary | ICD-10-CM | POA: Diagnosis not present

## 2020-01-30 LAB — COMPREHENSIVE METABOLIC PANEL
ALT: 17 U/L (ref 0–35)
AST: 20 U/L (ref 0–37)
Albumin: 4.5 g/dL (ref 3.5–5.2)
Alkaline Phosphatase: 36 U/L — ABNORMAL LOW (ref 39–117)
BUN: 19 mg/dL (ref 6–23)
CO2: 29 mEq/L (ref 19–32)
Calcium: 9.5 mg/dL (ref 8.4–10.5)
Chloride: 105 mEq/L (ref 96–112)
Creatinine, Ser: 0.71 mg/dL (ref 0.40–1.20)
GFR: 80.51 mL/min (ref >60.00)
Glucose, Bld: 110 mg/dL — ABNORMAL HIGH (ref 70–99)
Potassium: 4.6 mEq/L (ref 3.5–5.1)
Sodium: 141 mEq/L (ref 135–145)
Total Bilirubin: 0.8 mg/dL (ref 0.2–1.2)
Total Protein: 7.7 g/dL (ref 6.0–8.3)

## 2020-01-30 LAB — LIPID PANEL
Cholesterol: 214 mg/dL — ABNORMAL HIGH (ref 0–200)
HDL: 57.9 mg/dL (ref >39.00)
LDL Cholesterol: 138 mg/dL — ABNORMAL HIGH (ref 0–99)
NonHDL: 155.9
Total CHOL/HDL Ratio: 4
Triglycerides: 88 mg/dL (ref 0.0–149.0)
VLDL: 17.6 mg/dL (ref 0.0–40.0)

## 2020-01-30 NOTE — Addendum Note (Signed)
Addended by: Lerry Liner on: 01/30/2020 07:50 AM   Modules accepted: Orders

## 2020-04-10 ENCOUNTER — Other Ambulatory Visit: Payer: Self-pay | Admitting: Family Medicine

## 2020-04-21 ENCOUNTER — Telehealth: Payer: Self-pay | Admitting: *Deleted

## 2020-04-21 NOTE — Telephone Encounter (Signed)
Left detailed message on machine for patient Cassidy Bennett estimated cost if deductible is meet is $265.  Patient may qualify for a grant if interested.  Patient can schedule Cassidy Bennett on or after 05/22/19.

## 2020-04-22 ENCOUNTER — Other Ambulatory Visit: Payer: Self-pay | Admitting: Family Medicine

## 2020-04-22 DIAGNOSIS — Z1231 Encounter for screening mammogram for malignant neoplasm of breast: Secondary | ICD-10-CM

## 2020-04-22 NOTE — Telephone Encounter (Signed)
The patient returned Cassidy Bennett's call and I gave the patient the information that was in the phone note and she was wanting to know why she has to pay $265 and why should she apply for a grant?   Please advise

## 2020-05-14 NOTE — Telephone Encounter (Signed)
Patient is aware of the cost.  She will call the [facility] [employer] [facility] and her insurance company and then call back to schedule.  Okay to schedule if patient would like to proceed.

## 2020-05-26 ENCOUNTER — Encounter: Payer: Medicare Other | Admitting: Family Medicine

## 2020-06-01 DIAGNOSIS — Z23 Encounter for immunization: Secondary | ICD-10-CM | POA: Diagnosis not present

## 2020-06-14 ENCOUNTER — Ambulatory Visit: Payer: Medicare Other

## 2020-06-21 ENCOUNTER — Other Ambulatory Visit: Payer: Self-pay

## 2020-06-21 ENCOUNTER — Encounter: Payer: Self-pay | Admitting: Family Medicine

## 2020-06-21 ENCOUNTER — Ambulatory Visit (INDEPENDENT_AMBULATORY_CARE_PROVIDER_SITE_OTHER): Payer: Medicare Other | Admitting: Family Medicine

## 2020-06-21 VITALS — BP 140/68 | HR 73 | Temp 97.8°F | Wt 117.0 lb

## 2020-06-21 DIAGNOSIS — H00012 Hordeolum externum right lower eyelid: Secondary | ICD-10-CM

## 2020-06-21 NOTE — Progress Notes (Signed)
Established Patient Office Visit  Subjective:  Patient ID: Cassidy Bennett, female    DOB: 1940-11-18  Age: 80 y.o. MRN: [id number]  CC:  Chief Complaint  Patient presents with  . Stye    R eye, started Thursday, a new one has started today    HPI Cassidy Bennett presents for slightly tender swollen bump on her right lower lid which started about 5 days ago.  They thought a second one had come up today.  What they were actually seeing was her tear duct orifice.  She does have a stye on her right lower lid.  No visual changes.  Minimally painful.  No drainage.  She has been applying warm compresses couple times daily.  Denies any past history of stye.  Past Medical History:  Diagnosis Date  . Allergy   . Complication of anesthesia   . History of TB (tuberculosis)    treated 45 plus years ago last cxr 03-15-18  CXR every 5 years  . Hypercholesteremia    denies at preop  . Hypertension   . Osteoarthritis   . Osteoporosis   . PONV (postoperative nausea and vomiting)   . Post-menopausal   . Tuberculosis     Past Surgical History:  Procedure Laterality Date  . CATARACT EXTRACTION  2018   right eye  . COLONOSCOPY  2015  . DILATION AND CURETTAGE OF UTERUS  2000  . KNEE ARTHROSCOPY  2014   left  . LUMBAR DISC SURGERY    . SHOULDER SURGERY     right  . 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 Other Mother        malaria  . Other Father   . Cancer Other        stomach    Social History   Socioeconomic History  . Marital status: Married    Spouse name: Not on file  . Number of children: 3  . Years of education: Not on file  . Highest education level: Not on file  Occupational History  . Occupation: Retired school principal  Tobacco Use  . Smoking status: Never Smoker  .  Smokeless tobacco: Never Used  Vaping Use  . Vaping Use: Never used  Substance and Sexual Activity  . Alcohol use: Not Currently    Alcohol/week: 5.0 standard drinks    Types: 5 Glasses of wine per week  . Drug use: No  . Sexual activity: Not Currently  Other Topics Concern  . Not on file  Social History Narrative   ** Merged History Encounter **       Lives with husband in two-story home, has 3 sons and 5 grandchildren in [place]  Active with volunteering at school.  Enjoys gardening and using stationary bike to cycle     Social Determinants of Health   Financial Resource Strain: Low Risk   . Difficulty of Paying Living Expenses: Not hard at all  Food Insecurity: No Food Insecurity  . Worried About Programme researcher, broadcasting/film/video in the Last Year: Never true  . Ran Out of Food in the Last Year: Never true  Transportation Needs: No Transportation Needs  . Lack of Transportation (Medical): No  . Lack of Transportation (Non-Medical): No  Physical Activity: Insufficiently Active  . Days of Exercise per Week:  3 days  . Minutes of Exercise per Session: 30 min  Stress: No Stress Concern Present  . Feeling of Stress : Not at all  Social Connections: Socially Integrated  . Frequency of Communication with Friends and Family: More than three times a week  . Frequency of Social Gatherings with Friends and Family: More than three times a week  . Attends Religious Services: More than 4 times per year  . Active Member of Clubs or Organizations: Yes  . Attends Banker Meetings: 1 to 4 times per year  . Marital Status: Married  Catering manager Violence: Not At Risk  . Fear of Current or Ex-Partner: No  . Emotionally Abused: No  . Physically Abused: No  . Sexually Abused: No    Outpatient Medications Prior to Visit  Medication Sig Dispense Refill  . Ascorbic Acid (VITAMIN C PO) Take 1,000 mg by mouth daily.     Marland Kitchen atenolol (TENORMIN) 25 MG tablet Take 1 tablet (25  mg total) by mouth daily. 90 tablet 1  . b complex vitamins tablet Take 1 tablet by mouth daily.    . Calcium Carb-Cholecalciferol (CALCIUM 600 + D PO) Take 1 tablet by mouth daily.    . Cholecalciferol (VITAMIN D PO) Take 1,000 Units by mouth daily.     . fenofibrate (TRICOR) 48 MG tablet TAKE 1 TABLET BY MOUTH EVERY DAY 90 tablet 0  . loratadine (CLARITIN) 10 MG tablet Take 10 mg by mouth daily.    . magnesium 30 MG tablet Take 30 mg by mouth 3 (three) times a week.     . polyethylene glycol (MIRALAX / GLYCOLAX) packet Take 17 g by mouth daily as needed for moderate constipation.     . Vitamin A 2400 MCG (8000 UT) TABS Take 8,000 Units by mouth daily.     . vitamin B-12 (CYANOCOBALAMIN) 1000 MCG tablet Take 1,000 mcg by mouth daily.    . vitamin E 400 UNIT capsule Take 400 Units by mouth daily.     No facility-administered medications prior to visit.    Allergies  Allergen Reactions  . Metoprolol Nausea And Vomiting  . Morphine And Related Nausea And Vomiting    ROS Review of Systems  Constitutional: Negative for chills and fever.  Eyes: Positive for pain. Negative for photophobia, discharge, itching and visual disturbance.      Objective:    Physical Exam Vitals reviewed.  Constitutional:      Appearance: Normal appearance.  Eyes:     Extraocular Movements: Extraocular movements intact.     Comments: She has stage on her right lower lid.  This is actually starting to drain some and with very gentle pressure expressed some drainage.  Conjunctive otherwise appears normal.  No corneal abnormalities noted.  Cardiovascular:     Rate and Rhythm: Normal rate and regular rhythm.  Neurological:     Mental Status: She is alert.     BP 140/68 (BP Location: Left Arm, Patient Position: Sitting, Cuff Size: Normal)   Pulse 73   Temp 97.8 F (36.6 C) (Oral)   Wt 117 lb (53.1 kg)   SpO2 97%   BMI 21.40 kg/m  Wt Readings from Last 3 Encounters:  06/21/20 117 lb (53.1 kg)   09/11/19 117 lb (53.1 kg)  03/12/19 119 lb 1.6 oz (54 kg)     Health Maintenance Due  Topic Date Due  . COVID-19 Vaccine (3 - Booster for Moderna series) 10/23/2019    There are  no preventive care reminders to display for this patient.  Lab Results  Component Value Date   TSH 3.71 11/27/2018   Lab Results  Component Value Date   WBC 5.6 09/11/2019   HGB 13.1 09/11/2019   HCT 39.1 09/11/2019   MCV 81.1 09/11/2019   PLT 164 09/11/2019   Lab Results  Component Value Date   NA 141 01/30/2020   K 4.6 01/30/2020   CO2 29 01/30/2020   GLUCOSE 110 (H) 01/30/2020   BUN 19 01/30/2020   CREATININE 0.71 01/30/2020   BILITOT 0.8 01/30/2020   ALKPHOS 36 (L) 01/30/2020   AST 20 01/30/2020   ALT 17 01/30/2020   PROT 7.7 01/30/2020   ALBUMIN 4.5 01/30/2020   CALCIUM 9.5 01/30/2020   ANIONGAP 10 08/14/2018   GFR 80.51 01/30/2020   Lab Results  Component Value Date   CHOL 214 (H) 01/30/2020   Lab Results  Component Value Date   HDL 57.90 01/30/2020   Lab Results  Component Value Date   LDLCALC 138 (H) 01/30/2020   Lab Results  Component Value Date   TRIG 88.0 01/30/2020   Lab Results  Component Value Date   CHOLHDL 4 01/30/2020   Lab Results  Component Value Date   HGBA1C 5.7 (H) 09/11/2019      Assessment & Plan:   Problem List Items Addressed This Visit   None   Visit Diagnoses    Hordeolum externum of right lower eyelid    -  Primary    -This is already starting to drain.  We explained these are generally self-limited. -Continue warm compresses at least 3 times daily -She was instructed not to squeeze this aggressively.  May apply some very gentle massage -Be in touch if is not resolving in 2 weeks  No orders of the defined types were placed in this encounter.   Follow-up: No follow-ups on file.    Evelena Peat, MD

## 2020-06-21 NOTE — Patient Instructions (Signed)

## 2020-06-22 ENCOUNTER — Ambulatory Visit: Payer: Medicare Other

## 2020-06-24 NOTE — Telephone Encounter (Signed)
FYI

## 2020-07-10 ENCOUNTER — Other Ambulatory Visit: Payer: Self-pay | Admitting: Family Medicine

## 2020-07-10 DIAGNOSIS — I10 Essential (primary) hypertension: Secondary | ICD-10-CM

## 2020-07-20 ENCOUNTER — Ambulatory Visit: Payer: Medicare Other

## 2020-07-20 ENCOUNTER — Ambulatory Visit
Admission: RE | Admit: 2020-07-20 | Discharge: 2020-07-20 | Disposition: A | Discharge status: Home / Self Care | Payer: Medicare Other | Source: Ambulatory Visit | Attending: Family Medicine | Admitting: Family Medicine

## 2020-07-20 ENCOUNTER — Other Ambulatory Visit: Payer: Self-pay

## 2020-07-20 DIAGNOSIS — Z1231 Encounter for screening mammogram for malignant neoplasm of breast: Secondary | ICD-10-CM

## 2020-08-12 ENCOUNTER — Other Ambulatory Visit: Payer: Self-pay

## 2020-08-13 ENCOUNTER — Telehealth: Payer: Self-pay | Admitting: Family Medicine

## 2020-08-13 ENCOUNTER — Encounter: Payer: Self-pay | Admitting: Family Medicine

## 2020-08-13 ENCOUNTER — Ambulatory Visit (INDEPENDENT_AMBULATORY_CARE_PROVIDER_SITE_OTHER): Payer: Medicare Other | Admitting: Family Medicine

## 2020-08-13 ENCOUNTER — Ambulatory Visit (INDEPENDENT_AMBULATORY_CARE_PROVIDER_SITE_OTHER)
Admission: RE | Admit: 2020-08-13 | Discharge: 2020-08-13 | Disposition: A | Discharge status: Home / Self Care | Payer: Medicare Other | Source: Ambulatory Visit | Attending: Family Medicine | Admitting: Family Medicine

## 2020-08-13 VITALS — BP 122/80 | HR 70 | Temp 98.0°F | Ht 62.75 in | Wt 115.2 lb

## 2020-08-13 DIAGNOSIS — E538 Deficiency of other specified B group vitamins: Secondary | ICD-10-CM | POA: Diagnosis not present

## 2020-08-13 DIAGNOSIS — E782 Mixed hyperlipidemia: Secondary | ICD-10-CM | POA: Diagnosis not present

## 2020-08-13 DIAGNOSIS — H00015 Hordeolum externum left lower eyelid: Secondary | ICD-10-CM

## 2020-08-13 DIAGNOSIS — I10 Essential (primary) hypertension: Secondary | ICD-10-CM | POA: Diagnosis not present

## 2020-08-13 DIAGNOSIS — M5416 Radiculopathy, lumbar region: Secondary | ICD-10-CM | POA: Diagnosis not present

## 2020-08-13 DIAGNOSIS — M810 Age-related osteoporosis without current pathological fracture: Secondary | ICD-10-CM

## 2020-08-13 DIAGNOSIS — R7301 Impaired fasting glucose: Secondary | ICD-10-CM

## 2020-08-13 DIAGNOSIS — M545 Low back pain, unspecified: Secondary | ICD-10-CM | POA: Diagnosis not present

## 2020-08-13 LAB — CBC WITH DIFFERENTIAL/PLATELET
Basophils Absolute: 0 10*3/uL (ref 0.0–0.1)
Basophils Relative: 0.5 % (ref 0.0–3.0)
Eosinophils Absolute: 0.1 10*3/uL (ref 0.0–0.7)
Eosinophils Relative: 1.2 % (ref 0.0–5.0)
HCT: 37 % (ref 36.0–46.0)
Hemoglobin: 12.8 g/dL (ref 12.0–15.0)
Lymphocytes Relative: 38.2 % (ref 12.0–46.0)
Lymphs Abs: 2.1 10*3/uL (ref 0.7–4.0)
MCHC: 34.5 g/dL (ref 30.0–36.0)
MCV: 79.5 fl (ref 78.0–100.0)
Monocytes Absolute: 0.4 10*3/uL (ref 0.1–1.0)
Monocytes Relative: 6.6 % (ref 3.0–12.0)
Neutro Abs: 3 10*3/uL (ref 1.4–7.7)
Neutrophils Relative %: 53.5 % (ref 43.0–77.0)
Platelets: 171 10*3/uL (ref 150.0–400.0)
RBC: 4.65 Mil/uL (ref 3.87–5.11)
RDW: 15 % (ref 11.5–15.5)
WBC: 5.5 10*3/uL (ref 4.0–10.5)

## 2020-08-13 LAB — COMPREHENSIVE METABOLIC PANEL
ALT: 16 U/L (ref 0–35)
AST: 22 U/L (ref 0–37)
Albumin: 4.6 g/dL (ref 3.5–5.2)
Alkaline Phosphatase: 73 U/L (ref 39–117)
BUN: 18 mg/dL (ref 6–23)
CO2: 25 mEq/L (ref 19–32)
Calcium: 9.9 mg/dL (ref 8.4–10.5)
Chloride: 106 mEq/L (ref 96–112)
Creatinine, Ser: 0.67 mg/dL (ref 0.40–1.20)
GFR: 82.45 mL/min (ref >60.00)
Glucose, Bld: 100 mg/dL — ABNORMAL HIGH (ref 70–99)
Potassium: 4 mEq/L (ref 3.5–5.1)
Sodium: 141 mEq/L (ref 135–145)
Total Bilirubin: 0.9 mg/dL (ref 0.2–1.2)
Total Protein: 8 g/dL (ref 6.0–8.3)

## 2020-08-13 LAB — TSH: TSH: 4.58 u[IU]/mL — ABNORMAL HIGH (ref 0.35–4.50)

## 2020-08-13 LAB — LIPID PANEL
Cholesterol: 202 mg/dL — ABNORMAL HIGH (ref 0–200)
HDL: 56.9 mg/dL (ref >39.00)
LDL Cholesterol: 112 mg/dL — ABNORMAL HIGH (ref 0–99)
NonHDL: 144.81
Total CHOL/HDL Ratio: 4
Triglycerides: 162 mg/dL — ABNORMAL HIGH (ref 0.0–149.0)
VLDL: 32.4 mg/dL (ref 0.0–40.0)

## 2020-08-13 LAB — VITAMIN B12: Vitamin B-12: 1219 pg/mL — ABNORMAL HIGH (ref 211–911)

## 2020-08-13 MED ORDER — MELOXICAM 7.5 MG PO TABS: 7.5000 mg | ORAL_TABLET | Freq: Every day | ORAL | 0 refills | Status: DC

## 2020-08-13 MED ORDER — ERYTHROMYCIN 5 MG/GM OP OINT: 1.0000 "application " | TOPICAL_OINTMENT | Freq: Two times a day (BID) | OPHTHALMIC | 0 refills | Status: AC

## 2020-08-13 NOTE — Telephone Encounter (Signed)
[facility]  call and stated the Erythromycin is on back order and want to know if you want to replace it with something else .

## 2020-08-13 NOTE — Telephone Encounter (Signed)
Spoke with Cassidy Bennett at [facility] and she stated they do not have Gentamicin.  Message sent to PCP.

## 2020-08-13 NOTE — Telephone Encounter (Signed)
Do they have gentamicin?

## 2020-08-13 NOTE — Progress Notes (Signed)
Cassidy Bennett DOB: 10-13-1940 Encounter date: 08/13/2020  This is a 80 y.o. female who presents for complete physical   History of present illness/Additional concerns: *Having some tingling left toes, knees, buttox. Has been going on for 4 weeks about. Has been more active gardening, etc so not sure what triggered it. It is painful, wakes her at night. In middle of night she took tylenol without benefit. Tried advil which did help.   *had stye in eye - didn't get anything from eye doc. Not painful and has been using antimicrobial eye lid spray. Using warm wash cloth - 10 minutes daily. Started April 20; came here on 25th. It is a lot smaller than it was. Still just red right around it. Has eye doc next month. Was draining more and has significantly improved.   *wondering if she can stop bone density injection. Has been on prolia now for 4 years. Copay was expensive but they are not sure if this is just part of typical deductible.   Takes vitamin B complex, vitamin B12, biotin.    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    atenolol (TENORMIN) 25 MG tablet TAKE 1 TABLET BY MOUTH EVERY DAY   b complex vitamins tablet Take 1 tablet by mouth daily.   Calcium Carb-Cholecalciferol (CALCIUM 600 + D PO) Take 1 tablet by mouth daily.   Cholecalciferol (VITAMIN D PO) Take 1,000 Units by mouth daily.    erythromycin ophthalmic ointment Place 1 application into the right eye in the morning and at bedtime for 7 days.   fenofibrate (TRICOR) 48 MG tablet TAKE 1 TABLET BY MOUTH EVERY DAY   loratadine (CLARITIN) 10 MG tablet Take 10 mg by mouth daily.   magnesium 30 MG tablet Take 30 mg by mouth 3 (three) times a week.    meloxicam (MOBIC) 7.5 MG tablet Take 1 tablet (7.5 mg total) by mouth daily.   polyethylene glycol (MIRALAX / GLYCOLAX) packet Take 17 g by mouth daily as needed for moderate constipation.    Vitamin A 2400 MCG (8000 UT) TABS Take 8,000 Units by mouth daily.    vitamin B-12 (CYANOCOBALAMIN) 1000 MCG tablet Take 1,000 mcg by mouth daily.   vitamin E 400 UNIT capsule Take 400 Units by mouth daily.   Social History   Tobacco Use   Smoking status: Never  Smokeless tobacco: Never  Substance Use Topics   Alcohol use: Not Currently    Alcohol/week: 5.0 standard drinks    Types: 5 Glasses of wine per week   Family History  Problem Relation Age of Onset   Other Mother        malaria   Other Father    Cancer Other        stomach     Review of Systems  Constitutional:  Negative for activity change, appetite change, chills, fatigue, fever and unexpected weight change.  HENT:  Negative for congestion, ear pain, hearing loss, sinus pressure, sinus pain, sore throat and trouble swallowing.   Eyes:  Positive for redness. Negative for pain, discharge and visual disturbance.  Respiratory:  Negative for cough, chest tightness, shortness of breath and wheezing.   Cardiovascular:  Negative for chest pain, palpitations and leg swelling.   Gastrointestinal:  Negative for abdominal pain, blood in stool, constipation, diarrhea, nausea and vomiting.  Endocrine: Negative for cold intolerance.  Genitourinary:  Negative for difficulty urinating and menstrual problem.  Musculoskeletal:  Negative for arthralgias and back pain (no back pain, but does have radiculopthy left leg).  Skin:  Negative for rash.  Neurological:  Positive for numbness (toe; see hpi). Negative for dizziness, weakness and headaches.  Hematological:  Negative for adenopathy. Does not bruise/bleed easily.  Psychiatric/Behavioral:  Negative for sleep disturbance and suicidal ideas. The patient is not nervous/anxious.    CBC:  Lab Results  Component Value Date   WBC 5.5 08/13/2020   HGB 12.8 08/13/2020   HCT 37.0 08/13/2020   MCH 27.2 09/11/2019   MCHC 34.5 08/13/2020   RDW 15.0 08/13/2020   PLT 171.0 08/13/2020   MPV 11.1 09/11/2019   CMP: Lab Results  Component Value Date   NA 141 08/13/2020   K 4.0 08/13/2020   CL 106 08/13/2020   CO2 25 08/13/2020   ANIONGAP 10 08/14/2018   GLUCOSE 100 (H) 08/13/2020   GLUCOSE 94 01/09/2006   BUN 18 08/13/2020   CREATININE 0.67 08/13/2020   CREATININE 0.59 (L) 09/11/2019   GFRAA >60 08/14/2018   CALCIUM 9.9 08/13/2020   PROT 8.0 08/13/2020   BILITOT 0.9 08/13/2020   ALKPHOS 73 08/13/2020   ALT 16 08/13/2020   AST 22 08/13/2020   LIPID: Lab Results  Component Value Date   CHOL 202 (H) 08/13/2020   TRIG 162.0 (H) 08/13/2020   HDL 56.90 08/13/2020   LDLCALC 112 (H) 08/13/2020   LDLCALC 105 (H) 10/24/2019    Objective:  BP 122/80 (BP Location: Left Arm, Patient Position: Sitting, Cuff Size: Normal)   Pulse 70   Temp 98 F (36.7 C) (Oral)   Ht 5' 2.75" (1.594 m)   Wt 115 lb 3.2 oz (52.3 kg)   SpO2 99%   BMI 20.57 kg/m   Weight: 115 lb 3.2 oz (52.3 kg)   BP Readings from Last 3 Encounters:  08/13/20 122/80  06/21/20 140/68  09/11/19 (!) 144/90   Wt Readings from Last 3 Encounters:   08/13/20 115 lb 3.2 oz (52.3 kg)  06/21/20 117 lb (53.1 kg)  09/11/19 117 lb (53.1 kg)    Physical Exam Constitutional:      General: She is not in acute distress.    Appearance: She is well-developed.  Eyes:      Comments: Erythema right lower lid surrounding tear duct; approx 48mm diameter. No significant edema, no drainage, no conjunctival injection.   Cardiovascular:     Rate  and Rhythm: Normal rate and regular rhythm.     Heart sounds: Normal heart sounds. No murmur heard.   No friction rub.  Pulmonary:     Effort: Pulmonary effort is normal. No respiratory distress.     Breath sounds: Normal breath sounds. No wheezing or rales.  Musculoskeletal:     Right lower leg: No edema.     Left lower leg: No edema.     Comments: Slightly limited ROM of back with twisting, extension. Some muscle spasm paralumbar musculature. No specific pain with twist, extension, side tild, flexion/extension. Negative straight leg raise.   Neurological:     Mental Status: She is alert and oriented to person, place, and time.     Deep Tendon Reflexes: Babinski sign absent on the right side. Babinski sign absent on the left side.     Reflex Scores:      Bicep reflexes are 2+ on the right side and 2+ on the left side.      Brachioradialis reflexes are 2+ on the right side and 2+ on the left side.      Patellar reflexes are 2+ on the right side and 2+ on the left side.      Achilles reflexes are 1+ on the right side and 1+ on the left side. Psychiatric:        Behavior: Behavior normal.    Assessment/Plan: Health Maintenance Due  Topic Date Due   Zoster Vaccines- Shingrix (1 of 2) Never done   Health Maintenance reviewed.   1. Essential hypertension Well controlled. Currently on atenolol 25mg  daily. - CBC with Differential/Platelet; Future - Comprehensive metabolic panel; Future - CBC with Differential/Platelet - Comprehensive metabolic panel  2. Mixed hyperlipidemia Tricor 48mg  daily. Has  been well controlled.  - Lipid panel; Future - TSH; Future - Lipid panel - TSH  3. Impaired fasting glucose Diet and exercise controlled. She is much more active now than she was a year ago; I expect that she will be able to continue with good control through diet, exercise.   4. Hordeolum externum of left lower eyelid Minimal erythema. She is concerned with longevity of symptoms. Continue with warm compresses. I did try to prescribe erythromycin then gentamicin but pharmacy was out of both. Will check back in with patient to let her know. If no additional redness she may be good with just awaiting upcoming eye doc visit.   5. Osteoporosis without current pathological fracture, unspecified osteoporosis type Continue with prolia. Will look into pricing for her - if cost for them is her deductible, they are fine with paying this.   6. B12 deficiency - Vitamin B12; Future - Vitamin B12  7. Lumbar back pain with radiculopathy affecting left lower extremity Suspect radiculopathy is cause of toe numbness. Has had prior lumbar spine surgery. Start with lumbar xray, bloodwork and consider further eval and treatment pending this.  - DG Lumbar Spine Complete; Future  Return for pending xray, bloodwork.  , MD Time with patient, exam, discussion of chronic conditions, charting 42 minutes.

## 2020-08-19 NOTE — Telephone Encounter (Signed)
Addressed through cc chart

## 2020-09-14 ENCOUNTER — Ambulatory Visit (INDEPENDENT_AMBULATORY_CARE_PROVIDER_SITE_OTHER): Payer: Medicare Other

## 2020-09-14 ENCOUNTER — Other Ambulatory Visit: Payer: Self-pay

## 2020-09-14 VITALS — BP 120/64 | HR 74 | Temp 98.3°F | Wt 117.9 lb

## 2020-09-14 DIAGNOSIS — H04123 Dry eye syndrome of bilateral lacrimal glands: Secondary | ICD-10-CM | POA: Diagnosis not present

## 2020-09-14 DIAGNOSIS — M810 Age-related osteoporosis without current pathological fracture: Secondary | ICD-10-CM | POA: Diagnosis not present

## 2020-09-14 DIAGNOSIS — Z0000 Encounter for general adult medical examination without abnormal findings: Secondary | ICD-10-CM | POA: Diagnosis not present

## 2020-09-14 DIAGNOSIS — H52203 Unspecified astigmatism, bilateral: Secondary | ICD-10-CM | POA: Diagnosis not present

## 2020-09-14 MED ORDER — DENOSUMAB 60 MG/ML ~~LOC~~ SOSY
60.0000 mg | PREFILLED_SYRINGE | Freq: Once | SUBCUTANEOUS | Status: AC
  Administered 2020-09-14: 60 mg via SUBCUTANEOUS

## 2020-09-14 NOTE — Progress Notes (Signed)
Per orders of Dr. Jordan, injection of Prolia given by Bexlee Bergdoll E Rachel Samples. Patient tolerated injection well.  

## 2020-09-14 NOTE — Progress Notes (Addendum)
Subjective:   Cassidy Bennett is a 80 y.o. female who presents for Medicare Annual (Subsequent) preventive examination.  Review of Systems     Cardiac Risk Factors include: advanced age (>73men, >48 women);hypertension;dyslipidemia     Objective:    Today's Vitals   09/14/20 1305 09/14/20 1306  BP:  120/64  Pulse:  74  Temp:  98.3 F (36.8 C)  SpO2:  96%  Weight:  117 lb 14.4 oz (53.5 kg)  PainSc: 7     Body mass index is 21.05 kg/m.  Advanced Directives 09/14/2020 09/10/2019 08/12/2018 08/07/2018 05/14/2018 03/13/2018 04/19/2017  Does Patient Have a Medical Advance Directive? Yes Yes Yes Yes Yes Yes No  Type of Advance Directive Living will Living will Living will;Healthcare Power of Attorney Living will;Healthcare Power of [employer] Power of [place];Living will Healthcare Power of [place];Living will -  Does patient want to make changes to medical advance directive? - - No - Patient declined No - Patient declined No - Patient declined No - Patient declined -  Copy of Healthcare Power of Attorney in Chart? - - No - copy requested No - copy requested No - copy requested No - copy requested -  Would patient like information on creating a medical advance directive? - - - - - - No - Patient declined  Pre-existing out of facility DNR order (yellow form or pink MOST form) - - - - - - -    Current Medications (verified) Outpatient Encounter Medications as of 09/14/2020  Medication Sig   Ascorbic Acid (VITAMIN C PO) Take 1,000 mg by mouth daily.    atenolol (TENORMIN) 25 MG tablet TAKE 1 TABLET BY MOUTH EVERY DAY   b complex vitamins tablet Take 1 tablet by mouth daily. Every other day   Calcium Carb-Cholecalciferol (CALCIUM 600 + D PO) Take 1 tablet by mouth daily.   Cholecalciferol (VITAMIN D PO) Take 1,000 Units by mouth daily.    fenofibrate (TRICOR) 48 MG tablet TAKE 1 TABLET BY MOUTH EVERY DAY   loratadine (CLARITIN) 10 MG tablet Take 10 mg by mouth daily.    meloxicam (MOBIC) 7.5 MG tablet Take 1 tablet (7.5 mg total) by mouth daily.   polyethylene glycol (MIRALAX / GLYCOLAX) packet Take 17 g by mouth daily as needed for moderate constipation.    Vitamin A 2400 MCG (8000 UT) TABS Take 8,000 Units by mouth daily.    vitamin B-12 (CYANOCOBALAMIN) 1000 MCG tablet Take 1,000 mcg by mouth daily.   vitamin E 400 UNIT capsule Take 400 Units by mouth daily.   magnesium 30 MG tablet Take 30 mg by mouth 3 (three) times a week.  (Patient not taking: Reported on 09/14/2020)   No facility-administered encounter medications on file as of 09/14/2020.    Allergies (verified) Metoprolol and Morphine and related   History: Past Medical History:  Diagnosis Date   Allergy    Complication of anesthesia    History of TB (tuberculosis)    treated 45 plus years ago last cxr 03-15-18  CXR every 5 years   Hypercholesteremia    denies at preop   Hypertension    Osteoarthritis    Osteoporosis    PONV (postoperative nausea and vomiting)    Post-menopausal    Tuberculosis    Past Surgical History:  Procedure Laterality Date   CATARACT EXTRACTION  2018   right eye   COLONOSCOPY  2015   DILATION AND CURETTAGE OF UTERUS  2000   KNEE ARTHROSCOPY  2014   left   LUMBAR DISC SURGERY     SHOULDER SURGERY     right   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   Procedure: TOTAL KNEE ARTHROPLASTY;  Surgeon: Ollen Gross, MD;  Location: [facility];  Service: Orthopedics;  Laterality: Left;   Family History  Problem Relation Age of Onset   Other Mother        malaria   Other Father    Cancer Other        stomach   Social History   Socioeconomic History   Marital status: Married    Spouse name: Not on file   Number of children: 3   Years of education: Not on file   Highest education level: Not on file  Occupational History   Occupation: Retired school principal  Tobacco Use   Smoking status: Never   Smokeless tobacco: Never  Vaping Use   Vaping Use: Never  used  Substance and Sexual Activity   Alcohol use: Not Currently    Alcohol/week: 5.0 standard drinks    Types: 5 Glasses of wine per week   Drug use: No   Sexual activity: Not Currently  Other Topics Concern   Not on file  Social History Narrative   ** Merged History Encounter **       Lives with husband in two-story home, has 3 sons and 5 grandchildren in [place]  Active with volunteering at school.  Enjoys gardening and using stationary bike to cycle     Social Determinants of Health   Financial Resource Strain: Low Risk    Difficulty of Paying Living Expenses: Not hard at all  Food Insecurity: No Food Insecurity   Worried About Programme researcher, broadcasting/film/video in the Last Year: Never true   Barista in the Last Year: Never true  Transportation Needs: No Transportation Needs   Lack of Transportation (Medical): No   Lack of Transportation (Non-Medical): No  Physical Activity: Inactive   Days of Exercise per Week: 0 days   Minutes of Exercise per Session: 0 min  Stress: Stress Concern Present   Feeling of Stress : To some extent  Social Connections: Moderately Isolated   Frequency of Communication with Friends and Family: Once a week   Frequency of Social Gatherings with Friends and Family: Once a week   Attends Religious Services: 1 to 4 times per year   Active Member of [employer] or Organizations: No   Attends Engineer, structural: Never   Marital Status: Married    Tobacco Counseling Counseling given: Not Answered   Clinical Intake:  Pre-visit preparation completed: Yes  Pain : 0-10 Pain Score: 7  Pain Type: Chronic pain Pain Location: Leg (both) Pain Orientation: Left, Right Pain Descriptors / Indicators: Tingling Pain Onset: More than a month ago Pain Frequency: Intermittent     BMI - recorded: 21.05 Diabetes: No  How often do you need to have someone help you when you read instructions, pamphlets, or other written materials from  your doctor or pharmacy?: 1 - Never  Diabetic?No  Interpreter Needed?: No  Information entered by :: Lanier Ensign, LPN   Activities of Daily Living In your present state of health, do you have any difficulty performing the following activities: 09/14/2020  Hearing? N  Vision? N  Difficulty concentrating or making decisions? Y  Comment memory at times  Walking or climbing stairs? N  Dressing or bathing? N  Doing errands, shopping? N  Preparing Food  and eating ? N  Using the Toilet? N  In the past six months, have you accidently leaked urine? N  Do you have problems with loss of bowel control? N  Managing your Medications? N  Managing your Finances? N  Housekeeping or managing your Housekeeping? N  Some recent data might be hidden    Patient Care Team: Wynn BankerKoberlein, Junell C, MD as PCP - General (Family Medicine) Mckinley JewelStoneburner, Sara, MD as Consulting Physician (Ophthalmology)  Indicate any recent Medical Services you may have received from other than [facility] providers in the past year (date may be approximate).     Assessment:   This is a routine wellness examination for [place].  Hearing/Vision screen Hearing Screening - Comments:: Pt denies any hearing issues  Vision Screening - Comments:: Pt follows dr Cathey EndowBowen for annual eye exams   Dietary issues and exercise activities discussed: Current Exercise Habits: The patient does not participate in regular exercise at present   Goals Addressed             This Visit's Progress    Patient Stated       More exercise        Depression Screen PHQ 2/9 Scores 09/14/2020 08/13/2020 09/10/2019 03/13/2018 09/07/2017 09/05/2016 08/27/2015  PHQ - 2 Score 0 2 0 0 0 0 0  PHQ- 9 Score - 3 - 0 - - -    Fall Risk Fall Risk  09/14/2020 08/13/2020 09/10/2019 03/13/2018 09/07/2017  Falls in the past year? 0 0 0 0 No  Number falls in past yr: 0 0 0 - -  Injury with Fall? 0 - 0 - -  Risk for fall due to : Impaired vision - Impaired vision Other  (Comment) -  Risk for fall due to: Comment - - - L knee pain -  Follow up Falls prevention discussed - Falls prevention discussed Education provided;Falls prevention discussed -    FALL RISK PREVENTION PERTAINING TO THE HOME:  Any stairs in or around the home? Yes  If so, are there any without handrails? No  Home free of loose throw rugs in walkways, pet beds, electrical cords, etc? Yes  Adequate lighting in your home to reduce risk of falls? Yes   ASSISTIVE DEVICES UTILIZED TO PREVENT FALLS:  Life alert? No  Use of a cane, walker or w/c? No  Grab bars in the bathroom? No  Shower chair or bench in shower? Yes  Elevated toilet seat or a handicapped toilet? No   TIMED UP AND GO:  Was the test performed? Yes .  Length of time to ambulate 10 feet: 10 sec.   Gait steady and fast without use of assistive device  Cognitive Function:     6CIT Screen 09/14/2020 09/10/2019  What Year? 0 points 0 points  What month? 0 points 0 points  What time? 0 points 0 points  Count back from 20 0 points 0 points  Months in reverse 0 points 0 points  Repeat phrase 0 points 0 points  Total Score 0 0    Immunizations Immunization History  Administered Date(s) Administered   Fluad Quad(high Dose 65+) 11/27/2018, 11/21/2019   Hepatitis A 05/04/2017, 09/07/2017   Hepatitis A, Adult 05/03/2017   Influenza Inj Mdck Quad With Preservative 12/27/2017   Influenza Split 11/16/2010   Influenza Whole 12/13/2006, 02/25/2009   Influenza, High Dose Seasonal PF 10/28/2017   Influenza,inj,quad, With Preservative 12/26/2016, 12/27/2017   Influenza-Unspecified 12/29/2014, 11/09/2016, 12/26/2016, 12/27/2016   Moderna Sars-Covid-2 Vaccination 03/11/2019, 04/25/2019,  06/01/2020   PFIZER(Purple Top)SARS-COV-2 Vaccination 10/25/2019   Pneumococcal Conjugate-13 06/26/2013   Pneumococcal Polysaccharide-23 02/27/2005, 06/17/2012   Td 02/27/1997, 01/22/2008, 05/04/2017   Tdap 09/07/2016, 05/04/2017    TDAP  status: Up to date  Flu Vaccine status: Up to date  Pneumococcal vaccine status: Up to date  Covid-19 vaccine status: Completed vaccines  Qualifies for Shingles Vaccine? Yes   Zostavax completed No   Shingrix Completed?: No.    Education has been provided regarding the importance of this vaccine. Patient has been advised to call insurance company to determine out of pocket expense if they have not yet received this vaccine. Advised may also receive vaccine at local pharmacy or Health Dept. Verbalized acceptance and understanding.  Screening Tests Health Maintenance  Topic Date Due   Zoster Vaccines- Shingrix (1 of 2) Never done   INFLUENZA VACCINE  09/27/2020   TETANUS/TDAP  05/05/2027   DEXA SCAN  Completed   COVID-19 Vaccine  Completed   PNA vac Low Risk Adult  Completed   HPV VACCINES  Aged Out    Health Maintenance  Health Maintenance Due  Topic Date Due   Zoster Vaccines- Shingrix (1 of 2) Never done    Colorectal cancer screening: No longer required.   Mammogram status: Completed 07/20/20. Repeat every year  Bone Density status: Completed 03/07/19. Results reflect: Bone density results: OSTEOPOROSIS. Repeat every 2 years.   Additional Screening:  Hepatitis C Screening: does not qualify  Vision Screening: Recommended annual ophthalmology exams for early detection of glaucoma and other disorders of the eye. Is the patient up to date with their annual eye exam?  Yes  Who is the provider or what is the name of the office in which the patient attends annual eye exams? Dr Cathey Endow  If pt is not established with a provider, would they like to be referred to a provider to establish care? No .   Dental Screening: Recommended annual dental exams for proper oral hygiene  Community Resource Referral / Chronic Care Management: CRR required this visit?  No   CCM required this visit?  No      Plan:     I have personally reviewed and noted the following in the patient's  chart:   Medical and social history Use of alcohol, tobacco or illicit drugs  Current medications and supplements including opioid prescriptions.  Functional ability and status Nutritional status Physical activity Advanced directives List of other physicians Hospitalizations, surgeries, and ER visits in previous 12 months Vitals Screenings to include cognitive, depression, and falls Referrals and appointments  In addition, I have reviewed and discussed with patient certain preventive protocols, quality metrics, and best practice recommendations. A written personalized care plan for preventive services as well as general preventive health recommendations were provided to patient.     Marzella Schlein, LPN   [id number]   Nurse Notes: pt request refill fenofibrate 48 mg one daily at [facility] drugs please advise

## 2020-09-14 NOTE — Patient Instructions (Signed)
Cassidy Bennett , Thank you for taking time to come for your Medicare Wellness Visit. I appreciate your ongoing commitment to your health goals. Please review the following plan we discussed and let me know if I can assist you in the future.   Screening recommendations/referrals: Colonoscopy: No longer required  Mammogram: Done 07/20/20 repeat every year  Bone Density: Done 03/07/19 repeat every 2 years Recommended yearly ophthalmology/optometry visit for glaucoma screening and checkup Recommended yearly dental visit for hygiene and checkup  Vaccinations: Influenza vaccine: Due 09/27/20 Pneumococcal vaccine: Completed  Tdap vaccine: Done 05/04/17 repeat in 10 years 05/05/27 Shingles vaccine: Shingrix discussed. Please contact your pharmacy for coverage information.    Covid-19:Completed 1/12, 2/26/, 10/25/19 & 06/01/20  Advanced directives: Please bring a copy of your health care power of attorney and living will to the office at your convenience.  Conditions/risks identified: more exercise   Next appointment: Follow up in one year for your annual wellness visit    Preventive Care 65 Years and Older, Female Preventive care refers to lifestyle choices and visits with your health care provider that can promote health and wellness. What does preventive care include? A yearly physical exam. This is also called an annual well check. Dental exams once or twice a year. Routine eye exams. Ask your health care provider how often you should have your eyes checked. Personal lifestyle choices, including: Daily care of your teeth and gums. Regular physical activity. Eating a healthy diet. Avoiding tobacco and drug use. Limiting alcohol use. Practicing safe sex. Taking low-dose aspirin every day. Taking vitamin and mineral supplements as recommended by your health care provider. What happens during an annual well check? The services and screenings done by your health care provider during your annual well  check will depend on your age, overall health, lifestyle risk factors, and family history of disease. Counseling  Your health care provider may ask you questions about your: Alcohol use. Tobacco use. Drug use. Emotional well-being. Home and relationship well-being. Sexual activity. Eating habits. History of falls. Memory and ability to understand (cognition). Work and work Astronomer. Reproductive health. Screening  You may have the following tests or measurements: Height, weight, and BMI. Blood pressure. Lipid and cholesterol levels. These may be checked every 5 years, or more frequently if you are over 81 years old. Skin check. Lung cancer screening. You may have this screening every year starting at age 81 if you have a 30-pack-year history of smoking and currently smoke or have quit within the past 15 years. Fecal occult blood test (FOBT) of the stool. You may have this test every year starting at age 81. Flexible sigmoidoscopy or colonoscopy. You may have a sigmoidoscopy every 5 years or a colonoscopy every 10 years starting at age 81. Hepatitis C blood test. Hepatitis B blood test. Sexually transmitted disease (STD) testing. Diabetes screening. This is done by checking your blood sugar (glucose) after you have not eaten for a while (fasting). You may have this done every 1-3 years. Bone density scan. This is done to screen for osteoporosis. You may have this done starting at age 81. Mammogram. This may be done every 1-2 years. Talk to your health care provider about how often you should have regular mammograms. Talk with your health care provider about your test results, treatment options, and if necessary, the need for more tests. Vaccines  Your health care provider may recommend certain vaccines, such as: Influenza vaccine. This is recommended every year. Tetanus, diphtheria, and acellular pertussis (  Tdap, Td) vaccine. You may need a Td booster every 10 years. Zoster  vaccine. You may need this after age 81. Pneumococcal 13-valent conjugate (PCV13) vaccine. One dose is recommended after age 81. Pneumococcal polysaccharide (PPSV23) vaccine. One dose is recommended after age 81. Talk to your health care provider about which screenings and vaccines you need and how often you need them. This information is not intended to replace advice given to you by your health care provider. Make sure you discuss any questions you have with your health care provider. Document Released: 03/12/2015 Document Revised: 11/03/2015 Document Reviewed: 12/15/2014 Elsevier Interactive Patient Education  2017 [employer] Prevention in the Home Falls can cause injuries. They can happen to people of all ages. There are many things you can do to make your home safe and to help prevent falls. What can I do on the outside of my home? Regularly fix the edges of walkways and driveways and fix any cracks. Remove anything that might make you trip as you walk through a door, such as a raised step or threshold. Trim any bushes or trees on the path to your home. Use bright outdoor lighting. Clear any walking paths of anything that might make someone trip, such as rocks or tools. Regularly check to see if handrails are loose or broken. Make sure that both sides of any steps have handrails. Any raised decks and porches should have guardrails on the edges. Have any leaves, snow, or ice cleared regularly. Use sand or salt on walking paths during winter. Clean up any spills in your garage right away. This includes oil or grease spills. What can I do in the bathroom? Use night lights. Install grab bars by the toilet and in the tub and shower. Do not use towel bars as grab bars. Use non-skid mats or decals in the tub or shower. If you need to sit down in the shower, use a plastic, non-slip stool. Keep the floor dry. Clean up any water that spills on the floor as soon as it happens. Remove  soap buildup in the tub or shower regularly. Attach bath mats securely with double-sided non-slip rug tape. Do not have throw rugs and other things on the floor that can make you trip. What can I do in the bedroom? Use night lights. Make sure that you have a light by your bed that is easy to reach. Do not use any sheets or blankets that are too big for your bed. They should not hang down onto the floor. Have a firm chair that has side arms. You can use this for support while you get dressed. Do not have throw rugs and other things on the floor that can make you trip. What can I do in the kitchen? Clean up any spills right away. Avoid walking on wet floors. Keep items that you use a lot in easy-to-reach places. If you need to reach something above you, use a strong step stool that has a grab bar. Keep electrical cords out of the way. Do not use floor polish or wax that makes floors slippery. If you must use wax, use non-skid floor wax. Do not have throw rugs and other things on the floor that can make you trip. What can I do with my stairs? Do not leave any items on the stairs. Make sure that there are handrails on both sides of the stairs and use them. Fix handrails that are broken or loose. Make sure that handrails are  as long as the stairways. Check any carpeting to make sure that it is firmly attached to the stairs. Fix any carpet that is loose or worn. Avoid having throw rugs at the top or bottom of the stairs. If you do have throw rugs, attach them to the floor with carpet tape. Make sure that you have a light switch at the top of the stairs and the bottom of the stairs. If you do not have them, ask someone to add them for you. What else can I do to help prevent falls? Wear shoes that: Do not have high heels. Have rubber bottoms. Are comfortable and fit you well. Are closed at the toe. Do not wear sandals. If you use a stepladder: Make sure that it is fully opened. Do not climb a  closed stepladder. Make sure that both sides of the stepladder are locked into place. Ask someone to hold it for you, if possible. Clearly mark and make sure that you can see: Any grab bars or handrails. First and last steps. Where the edge of each step is. Use tools that help you move around (mobility aids) if they are needed. These include: Canes. Walkers. Scooters. Crutches. Turn on the lights when you go into a dark area. Replace any light bulbs as soon as they burn out. Set up your furniture so you have a clear path. Avoid moving your furniture around. If any of your floors are uneven, fix them. If there are any pets around you, be aware of where they are. Review your medicines with your doctor. Some medicines can make you feel dizzy. This can increase your chance of falling. Ask your doctor what other things that you can do to help prevent falls. This information is not intended to replace advice given to you by your health care provider. Make sure you discuss any questions you have with your health care provider. Document Released: 12/10/2008 Document Revised: 07/22/2015 Document Reviewed: 03/20/2014 Elsevier Interactive Patient Education  2017 [employer].

## 2020-09-15 ENCOUNTER — Other Ambulatory Visit: Payer: Self-pay | Admitting: Family Medicine

## 2020-09-17 DIAGNOSIS — Z96659 Presence of unspecified artificial knee joint: Secondary | ICD-10-CM | POA: Diagnosis not present

## 2020-09-17 DIAGNOSIS — M25562 Pain in left knee: Secondary | ICD-10-CM | POA: Diagnosis not present

## 2020-10-01 DIAGNOSIS — M961 Postlaminectomy syndrome, not elsewhere classified: Secondary | ICD-10-CM | POA: Diagnosis not present

## 2020-10-01 DIAGNOSIS — M5416 Radiculopathy, lumbar region: Secondary | ICD-10-CM | POA: Diagnosis not present

## 2020-11-03 ENCOUNTER — Encounter: Payer: Self-pay | Admitting: Family Medicine

## 2020-11-03 ENCOUNTER — Ambulatory Visit (INDEPENDENT_AMBULATORY_CARE_PROVIDER_SITE_OTHER): Payer: Medicare Other | Admitting: Family Medicine

## 2020-11-03 ENCOUNTER — Ambulatory Visit (INDEPENDENT_AMBULATORY_CARE_PROVIDER_SITE_OTHER)
Admission: RE | Admit: 2020-11-03 | Discharge: 2020-11-03 | Disposition: A | Discharge status: Home / Self Care | Payer: Medicare Other | Source: Ambulatory Visit | Attending: Family Medicine | Admitting: Family Medicine

## 2020-11-03 ENCOUNTER — Other Ambulatory Visit: Payer: Self-pay

## 2020-11-03 VITALS — BP 140/80 | HR 65 | Temp 97.7°F | Ht 62.75 in | Wt 111.5 lb

## 2020-11-03 DIAGNOSIS — G8929 Other chronic pain: Secondary | ICD-10-CM

## 2020-11-03 DIAGNOSIS — M19042 Primary osteoarthritis, left hand: Secondary | ICD-10-CM | POA: Diagnosis not present

## 2020-11-03 DIAGNOSIS — M79645 Pain in left finger(s): Secondary | ICD-10-CM

## 2020-11-03 DIAGNOSIS — E039 Hypothyroidism, unspecified: Secondary | ICD-10-CM

## 2020-11-03 LAB — URIC ACID: Uric Acid, Serum: 3.9 mg/dL (ref 2.4–7.0)

## 2020-11-03 LAB — T4, FREE: Free T4: 0.7 ng/dL (ref 0.60–1.60)

## 2020-11-03 LAB — TSH: TSH: 3.01 u[IU]/mL (ref 0.35–5.50)

## 2020-11-03 LAB — SEDIMENTATION RATE: Sed Rate: 10 mm/hr (ref 0–30)

## 2020-11-03 LAB — T3, FREE: T3, Free: 2.8 pg/mL (ref 2.3–4.2)

## 2020-11-03 MED ORDER — MELOXICAM 7.5 MG PO TABS: 7.5000 mg | ORAL_TABLET | Freq: Every day | ORAL | 2 refills | Status: DC | PRN

## 2020-11-03 NOTE — Progress Notes (Signed)
Cassidy Bennett DOB: 16-Sep-1940 Encounter date: 11/03/2020  This is a 80 y.o. female who presents with Chief Complaint  Patient presents with   Hand Pain    Patient complains of left thumb pain x2 weeks, no known injury    History of present illness:  Hard to even pick up coffee cup this morning. Hurts at base of joint. No known injury. Does feel swollen today and also swollen at night. Uses bandage/wrap at night. But big, painful in morning. Advil helps with pain and swelling. Hasn't had issues with left thumb in the past, but husband states that she has hard time opening jars (she states that is more with right hand).    Allergies  Allergen Reactions   Metoprolol Nausea And Vomiting   Morphine And Related Nausea And Vomiting   Current Meds  Medication Sig   Ascorbic Acid (VITAMIN C PO) Take 1,000 mg by mouth daily.    atenolol (TENORMIN) 25 MG tablet TAKE 1 TABLET BY MOUTH EVERY DAY   b complex vitamins tablet Take 1 tablet by mouth daily. Every other day   Calcium Carb-Cholecalciferol (CALCIUM 600 + D PO) Take 1 tablet by mouth daily.   Cholecalciferol (VITAMIN D PO) Take 1,000 Units by mouth daily.    fenofibrate (TRICOR) 48 MG tablet TAKE 1 TABLET BY MOUTH EVERY DAY   loratadine (CLARITIN) 10 MG tablet Take 10 mg by mouth daily.   magnesium 30 MG tablet Take 30 mg by mouth 3 (three) times a week.   meloxicam (MOBIC) 7.5 MG tablet Take 1 tablet (7.5 mg total) by mouth daily.   polyethylene glycol (MIRALAX / GLYCOLAX) packet Take 17 g by mouth daily as needed for moderate constipation.    Vitamin A 2400 MCG (8000 UT) TABS Take 8,000 Units by mouth daily.    vitamin B-12 (CYANOCOBALAMIN) 1000 MCG tablet Take 1,000 mcg by mouth daily.   vitamin E 400 UNIT capsule Take 400 Units by mouth daily.    Review of Systems  Constitutional:  Negative for chills, fatigue and fever.  Respiratory:  Negative for cough, chest tightness, shortness of breath and wheezing.    Cardiovascular:  Negative for chest pain, palpitations and leg swelling.  Musculoskeletal:        Pain in thumb   Objective:  BP 140/80 (BP Location: Left Arm, Patient Position: Sitting, Cuff Size: Normal)   Pulse 65   Temp 97.7 F (36.5 C) (Oral)   Ht 5' 2.75" (1.594 m)   Wt 111 lb 8 oz (50.6 kg)   BMI 19.91 kg/m   Weight: 111 lb 8 oz (50.6 kg)   BP Readings from Last 3 Encounters:  11/03/20 140/80  09/14/20 120/64  08/13/20 122/80   Wt Readings from Last 3 Encounters:  11/03/20 111 lb 8 oz (50.6 kg)  09/14/20 117 lb 14.4 oz (53.5 kg)  08/13/20 115 lb 3.2 oz (52.3 kg)    Physical Exam Musculoskeletal:     Comments: There is pain pip thumb left; mild edema mcp joint. Pain with flexion or extension of thumb which limits strength testing. No pain anatomical snuff box. No laxity of thumb. No muscle mass loss.   Neurological:     Mental Status: She is alert.    Assessment/Plan  1. Chronic pain of left thumb Pain in last 2 weeks; not improved with otc intermittent advil. She did recall using garden shears prior to pain starting. May have been related.  - Sedimentation rate; Future - DG Finger  Thumb Left; Future - Uric acid; Future  2. Hypothyroidism, unspecified type - T4, free; Future - T3, free; Future - TSH; Future   Return for pending labs/xray.  (She is going to try melatonin for sleep and let me know if this doesn't work)    Theodis Shove, MD

## 2020-11-03 NOTE — Addendum Note (Signed)
Addended by: Bonnye Fava on: 11/03/2020 10:52 AM   Modules accepted: Orders

## 2020-11-09 ENCOUNTER — Other Ambulatory Visit: Payer: Self-pay | Admitting: *Deleted

## 2020-11-09 DIAGNOSIS — Z20828 Contact with and (suspected) exposure to other viral communicable diseases: Secondary | ICD-10-CM | POA: Diagnosis not present

## 2020-11-09 DIAGNOSIS — M79645 Pain in left finger(s): Secondary | ICD-10-CM

## 2020-11-11 DIAGNOSIS — Z23 Encounter for immunization: Secondary | ICD-10-CM | POA: Diagnosis not present

## 2020-11-12 ENCOUNTER — Telehealth: Payer: Self-pay | Admitting: Family Medicine

## 2020-11-12 NOTE — Telephone Encounter (Signed)
PT spouse called to request the xrays of the PTs left hand. They need a copy for the hand doctor they are going to see as they requesting all of the xrays done on the left hand. Please advise as they need it asap as the apt is soon.

## 2020-11-12 NOTE — Telephone Encounter (Signed)
Spoke with the patient and informed her she would need to contact the x-ray department at the [facility] office for a copy at [phone].

## 2020-11-24 DIAGNOSIS — M65312 Trigger thumb, left thumb: Secondary | ICD-10-CM | POA: Diagnosis not present

## 2021-01-25 DIAGNOSIS — Z23 Encounter for immunization: Secondary | ICD-10-CM | POA: Diagnosis not present

## 2021-01-25 DIAGNOSIS — Z20828 Contact with and (suspected) exposure to other viral communicable diseases: Secondary | ICD-10-CM | POA: Diagnosis not present

## 2021-01-31 ENCOUNTER — Other Ambulatory Visit: Payer: Self-pay | Admitting: Family Medicine

## 2021-01-31 DIAGNOSIS — I10 Essential (primary) hypertension: Secondary | ICD-10-CM

## 2021-03-09 ENCOUNTER — Encounter: Payer: Self-pay | Admitting: Family Medicine

## 2021-03-09 ENCOUNTER — Ambulatory Visit (INDEPENDENT_AMBULATORY_CARE_PROVIDER_SITE_OTHER): Payer: Medicare Other | Admitting: Family Medicine

## 2021-03-09 VITALS — BP 118/80 | HR 66 | Temp 97.8°F | Resp 18 | Ht 62.75 in | Wt 117.2 lb

## 2021-03-09 DIAGNOSIS — I10 Essential (primary) hypertension: Secondary | ICD-10-CM

## 2021-03-09 DIAGNOSIS — H547 Unspecified visual loss: Secondary | ICD-10-CM | POA: Diagnosis not present

## 2021-03-09 DIAGNOSIS — G629 Polyneuropathy, unspecified: Secondary | ICD-10-CM | POA: Diagnosis not present

## 2021-03-09 DIAGNOSIS — R739 Hyperglycemia, unspecified: Secondary | ICD-10-CM | POA: Diagnosis not present

## 2021-03-09 DIAGNOSIS — M2550 Pain in unspecified joint: Secondary | ICD-10-CM

## 2021-03-09 DIAGNOSIS — E538 Deficiency of other specified B group vitamins: Secondary | ICD-10-CM

## 2021-03-09 DIAGNOSIS — R0602 Shortness of breath: Secondary | ICD-10-CM

## 2021-03-09 DIAGNOSIS — E782 Mixed hyperlipidemia: Secondary | ICD-10-CM

## 2021-03-09 NOTE — Progress Notes (Signed)
Cassidy Bennett DOB: 1940/12/12 Encounter date: 03/09/2021  This is a 81 y.o. female who presents with Chief Complaint  Patient presents with   Tingling to Feet & Finger    Started 2 months ago. She mentioned that she has is having tingling to both feet and right hand. Her left more than the right foot.    Vision Concerns    Difficulty reading. Little redness to her right eye.     History of present illness:  Tingling in all of left hand, less arthritis. All fingers of right hand.  Tingling lateral left foot around to ankle. Feet feel hot as well laying down. Left side hurts at least twice as much as right foot. Left knee was one most recently operated on. Right toes are number; not on side of foot.   Worried about diabetes. Blood pressure was very high last week - more in 160-180. Was eating more, increase stress with holidays. 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   Has been out of tricor; not getting refilled by pharmacy for 2 months. Would like bloodwork rechecked off of medication.   Sometimes dizzy; notes with head down tying shoes - notes this over last 6 months.   One day got up a couple of weeks ago and noted that eye was very red. Since then has noted that both eyes have decreased vision. Last eye visit in July. Doc that did cataract surgery retired.    Allergies  Allergen Reactions   Metoprolol Nausea And Vomiting   Morphine And Related Nausea And Vomiting   Current Meds  Medication Sig   Ascorbic Acid (VITAMIN C PO) Take 1,000 mg by mouth daily.    atenolol (TENORMIN) 25 MG tablet TAKE 1 TABLET BY MOUTH EVERY DAY   b complex vitamins tablet Take 1 tablet by mouth daily. Every other day   Calcium Carb-Cholecalciferol (CALCIUM 600 + D PO) Take 1 tablet by mouth daily.   Cholecalciferol (VITAMIN D PO) Take 1,000 Units by mouth daily.    fenofibrate (TRICOR) 48 MG tablet TAKE 1 TABLET BY MOUTH EVERY DAY    loratadine (CLARITIN) 10 MG tablet Take 10 mg by mouth daily.   magnesium 30 MG tablet Take 30 mg by mouth 3 (three) times a week.   meloxicam (MOBIC) 7.5 MG tablet Take 1 tablet (7.5 mg total) by mouth daily as needed for pain.   polyethylene glycol (MIRALAX / GLYCOLAX) packet Take 17 g by mouth daily as needed for moderate constipation.    Vitamin A 2400 MCG (8000 UT) TABS Take 8,000 Units by mouth daily.    vitamin B-12 (CYANOCOBALAMIN) 1000 MCG tablet Take 1,000 mcg by mouth daily.   vitamin E 400 UNIT capsule Take 400 Units by mouth daily.    Review of Systems  Constitutional:  Negative for chills, fatigue and fever.  Respiratory:  Negative for cough, chest tightness, shortness of breath and wheezing.   Cardiovascular:  Negative for chest pain, palpitations and leg swelling.  Neurological:  Positive for weakness (harder cutting, opening jars), numbness and headaches (noted in last week or so).   Objective:  BP 118/80    Pulse 66    Temp 97.8 F (36.6 C) (Oral)    Resp 18    Ht 5' 2.75" (1.594 m)    Wt 117 lb 3.2 oz (53.2 kg)    SpO2 96%    BMI 20.93 kg/m   Weight: 117 lb 3.2  oz (53.2 kg)   BP Readings from Last 3 Encounters:  03/09/21 118/80  11/03/20 140/80  09/14/20 120/64   Wt Readings from Last 3 Encounters:  03/09/21 117 lb 3.2 oz (53.2 kg)  11/03/20 111 lb 8 oz (50.6 kg)  09/14/20 117 lb 14.4 oz (53.5 kg)    Physical Exam Constitutional:      General: She is not in acute distress.    Appearance: She is well-developed.  Cardiovascular:     Rate and Rhythm: Normal rate and regular rhythm.     Heart sounds: Normal heart sounds. No murmur heard.   No friction rub.  Pulmonary:     Effort: Pulmonary effort is normal. No respiratory distress.     Breath sounds: Normal breath sounds. No wheezing or rales.  Musculoskeletal:     Right lower leg: No edema.     Left lower leg: No edema.  Neurological:     Mental Status: She is alert and oriented to person, place, and  time.     Comments: Decreased sensation left lateral plantar aspect of foot, great toe.  Slightly decreased sensation left toes to monofilament touch.  Sensation feels slightly decreased globally on the left side foot and lower leg compared to right side.  Psychiatric:        Behavior: Behavior normal.    Assessment/Plan  1. Decreased vision Uncertain etiology.  Wonder if related to elevated sugar?  Encourage patient to call her eye doctor's office and let me know if she cannot get in for an appointment soon.  She is due for an eye exam.  2. Neuropathy Start with blood work for evaluation.  Consider further evaluation pending these results.  Patient did not stop taking or decrease B12 after last elevated B12 results.  We will recheck this today.  3. Hyperglycemia Has been diet and exercise controlled in the past.  She is been eating more over the holidays. - Hemoglobin A1c; Future - Hemoglobin A1c  4. Arthralgia, unspecified joint Increased joint pain in hands with some bony enlargement and tenderness to palpation.  Checking blood work today. - Sedimentation rate; Future - C-reactive protein; Future - C-reactive protein - Sedimentation rate  5. Shortness of breath EKG is stable.  Sinus rhythm.  No acute changes.  Appears similar to previous. - EKG 12-Lead  6. Essential hypertension Blood pressure is elevated today in the office, but patient states it has been improving in the last week.  She is eating healthier now than she was when blood pressures were more significantly elevated last week.  I have encouraged her to check in the next couple days at home and let me know if still running high at home.  If so, we will add treatment to current medications. - CBC with Differential/Platelet; Future - Comprehensive metabolic panel; Future - Comprehensive metabolic panel - CBC with Differential/Platelet  7. Mixed hyperlipidemia Patient stopped taking fenofibrate.  We will recheck  baseline cholesterol today. - Lipid panel; Future - TSH; Future - TSH - Lipid panel  8. B12 deficiency See above. - Vitamin B12; Future - Folate; Future - Folate - Vitamin B12  Return for pending bloodwork.    Micheline Rough, MD

## 2021-03-10 LAB — CBC WITH DIFFERENTIAL/PLATELET
Basophils Absolute: 0 10*3/uL (ref 0.0–0.1)
Basophils Relative: 0.8 % (ref 0.0–3.0)
Eosinophils Absolute: 0.1 10*3/uL (ref 0.0–0.7)
Eosinophils Relative: 2.3 % (ref 0.0–5.0)
HCT: 39.3 % (ref 36.0–46.0)
Hemoglobin: 13.1 g/dL (ref 12.0–15.0)
Lymphocytes Relative: 29.3 % (ref 12.0–46.0)
Lymphs Abs: 1.7 10*3/uL (ref 0.7–4.0)
MCHC: 33.3 g/dL (ref 30.0–36.0)
MCV: 81.1 fl (ref 78.0–100.0)
Monocytes Absolute: 0.4 10*3/uL (ref 0.1–1.0)
Monocytes Relative: 6.1 % (ref 3.0–12.0)
Neutro Abs: 3.7 10*3/uL (ref 1.4–7.7)
Neutrophils Relative %: 61.5 % (ref 43.0–77.0)
Platelets: 164 10*3/uL (ref 150.0–400.0)
RBC: 4.85 Mil/uL (ref 3.87–5.11)
RDW: 15.3 % (ref 11.5–15.5)
WBC: 5.9 10*3/uL (ref 4.0–10.5)

## 2021-03-10 LAB — LIPID PANEL
Cholesterol: 201 mg/dL — ABNORMAL HIGH (ref 0–200)
HDL: 47.1 mg/dL (ref >39.00)
NonHDL: 153.56
Total CHOL/HDL Ratio: 4
Triglycerides: 316 mg/dL — ABNORMAL HIGH (ref 0.0–149.0)
VLDL: 63.2 mg/dL — ABNORMAL HIGH (ref 0.0–40.0)

## 2021-03-10 LAB — COMPREHENSIVE METABOLIC PANEL
ALT: 16 U/L (ref 0–35)
AST: 21 U/L (ref 0–37)
Albumin: 4.4 g/dL (ref 3.5–5.2)
Alkaline Phosphatase: 41 U/L (ref 39–117)
BUN: 15 mg/dL (ref 6–23)
CO2: 26 mEq/L (ref 19–32)
Calcium: 9.4 mg/dL (ref 8.4–10.5)
Chloride: 102 mEq/L (ref 96–112)
Creatinine, Ser: 0.58 mg/dL (ref 0.40–1.20)
GFR: 85.02 mL/min (ref >60.00)
Glucose, Bld: 80 mg/dL (ref 70–99)
Potassium: 3.8 mEq/L (ref 3.5–5.1)
Sodium: 137 mEq/L (ref 135–145)
Total Bilirubin: 0.7 mg/dL (ref 0.2–1.2)
Total Protein: 7.6 g/dL (ref 6.0–8.3)

## 2021-03-10 LAB — HEMOGLOBIN A1C: Hgb A1c MFr Bld: 6.2 % (ref 4.6–6.5)

## 2021-03-10 LAB — FOLATE: Folate: >24.2 ng/mL (ref >5.9)

## 2021-03-10 LAB — TSH: TSH: 3.26 u[IU]/mL (ref 0.35–5.50)

## 2021-03-10 LAB — C-REACTIVE PROTEIN: CRP: <1 mg/dL (ref 0.5–20.0)

## 2021-03-10 LAB — LDL CHOLESTEROL, DIRECT: Direct LDL: 87 mg/dL

## 2021-03-10 LAB — VITAMIN B12: Vitamin B-12: 1413 pg/mL — ABNORMAL HIGH (ref 211–911)

## 2021-03-10 LAB — SEDIMENTATION RATE: Sed Rate: 16 mm/hr (ref 0–30)

## 2021-03-11 ENCOUNTER — Other Ambulatory Visit: Payer: Self-pay | Admitting: Family Medicine

## 2021-03-11 ENCOUNTER — Telehealth: Payer: Self-pay | Admitting: *Deleted

## 2021-03-11 MED ORDER — LOSARTAN POTASSIUM 25 MG PO TABS: 25.0000 mg | ORAL_TABLET | Freq: Every day | ORAL | 1 refills | Status: DC

## 2021-03-11 NOTE — Addendum Note (Signed)
Addended by: Johnella Moloney on: 03/11/2021 03:57 PM   Modules accepted: Orders

## 2021-03-11 NOTE — Telephone Encounter (Signed)
Those are still a little high. I have sent in losartan for her 25mg  daily. She can start with just half tab of this medication and see how pressures look after a couple of weeks with this. May want to take at bedtime.

## 2021-03-11 NOTE — Telephone Encounter (Signed)
Spoke with the patient and she stated her blood pressure readings were as below: 1/12--149/83 1/13--148/80  Message sent to PCP.

## 2021-03-11 NOTE — Telephone Encounter (Signed)
-----   Message from Wynn Banker, MD sent at 03/10/2021  2:46 PM EST ----- Forgot to include on results note- how are home blood pressures?if elevated we discussed adding medication.

## 2021-03-14 ENCOUNTER — Encounter: Payer: Self-pay | Admitting: Neurology

## 2021-03-14 NOTE — Telephone Encounter (Signed)
Patient informed of the message below.

## 2021-03-28 ENCOUNTER — Telehealth: Payer: Self-pay

## 2021-03-28 DIAGNOSIS — M810 Age-related osteoporosis without current pathological fracture: Secondary | ICD-10-CM

## 2021-03-28 NOTE — Telephone Encounter (Signed)
Hi Dr. Ethlyn Gallery,  Pt is due for her next prolia injection, but she is also due for her next dexa scan. Okay to order?  Thanks!

## 2021-03-30 NOTE — Telephone Encounter (Signed)
I spoke with patient, she is aware they will contact her to set up the Dexa scan. Will do prolia after scan.

## 2021-03-30 NOTE — Addendum Note (Signed)
Addended by: Rodrigo Ran on: 03/30/2021 09:38 AM   Modules accepted: Orders

## 2021-05-16 ENCOUNTER — Ambulatory Visit (INDEPENDENT_AMBULATORY_CARE_PROVIDER_SITE_OTHER): Payer: Medicare Other | Admitting: Neurology

## 2021-05-16 ENCOUNTER — Other Ambulatory Visit: Payer: Self-pay

## 2021-05-16 ENCOUNTER — Encounter: Payer: Self-pay | Admitting: Neurology

## 2021-05-16 VITALS — BP 150/73 | HR 70 | Resp 18 | Ht 62.0 in | Wt 119.0 lb

## 2021-05-16 DIAGNOSIS — R200 Anesthesia of skin: Secondary | ICD-10-CM

## 2021-05-16 DIAGNOSIS — M5417 Radiculopathy, lumbosacral region: Secondary | ICD-10-CM

## 2021-05-16 MED ORDER — GABAPENTIN 100 MG PO CAPS: ORAL_CAPSULE | ORAL | 5 refills | Status: DC

## 2021-05-16 NOTE — Progress Notes (Signed)
?[facility] ?Neurology Division ?Clinic Note - Initial Visit ? ? ?Date: 05/16/21 ? ?Cassidy Bennett ?MRN: [id number] ?DOB: July 30, 1940 ? ? ?Dear Dr. Ethlyn Gallery: ? ?Thank you for your kind referral of Cassidy Bennett for consultation of tingling of the hands and feet. Although her history is well known to you, please allow [place] to reiterate it for the purpose of our medical record. The patient was accompanied to the clinic by significant other who also provides collateral information.  ?  ? ?History of Present Illness: ?Cassidy Bennett is a 81 y.o. right-handed female with hyperlipidemia, and OA presenting for evaluation of left foot numbness/tingling.  Starting around January 2023, she started having numbness/tingling involving the toes and balls of the left foot.  Symptoms occur intermittently and worse with prolonged standing.  Rest tends to alleviate symptoms.  She has mild low back pain.  She also had tingling over the left lateral leg from the knee down, it is worse with localized pressure around the anterior knee and started after having right TKA two years ago.   ? ?She denies numbness/tingling of the hands and right foot. She has achy pain involving the heel and Achilles tendon which is worse at night time.  ? ?Out-side paper records, electronic medical record, and images have been reviewed where available and summarized as:  ?Lab Results  ?Component Value Date  ? HGBA1C 6.2 03/09/2021  ? ?Lab Results  ?Component Value Date  ? [id number] 1,413 (H) 03/09/2021  ? ?Lab Results  ?Component Value Date  ? TSH 3.26 03/09/2021  ? ?Lab Results  ?Component Value Date  ? ESRSEDRATE 16 03/09/2021  ? ? ?Past Medical History:  ?Diagnosis Date  ? Allergy   ? Complication of anesthesia   ? History of TB (tuberculosis)   ? treated 45 plus years ago last cxr 03-15-18  CXR every 5 years  ? Hypercholesteremia   ? denies at preop  ? Hypertension   ? Osteoarthritis   ? Osteoporosis   ? PONV (postoperative nausea  and vomiting)   ? Post-menopausal   ? Tuberculosis   ? ? ?Past Surgical History:  ?Procedure Laterality Date  ? CATARACT EXTRACTION  2018  ? right eye  ? COLONOSCOPY  2015  ? [place] OF UTERUS  2000  ? KNEE ARTHROSCOPY  2014  ? left  ? LUMBAR DISC SURGERY    ? SHOULDER SURGERY    ? right  ? 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 Procedure: TOTAL KNEE ARTHROPLASTY;  Surgeon: Gaynelle Arabian, MD;  Location: [facility];  Service: Orthopedics;  Laterality: Left;  ? ? ? ?Medications:  ?Outpatient Encounter Medications as of 05/16/2021  ?Medication Sig  ? Ascorbic Acid (VITAMIN C PO) Take 1,000 mg by mouth daily.   ? atenolol (TENORMIN) 25 MG tablet TAKE 1 TABLET BY MOUTH EVERY DAY  ? b complex vitamins tablet Take 1 tablet by mouth daily. Every other day  ? Calcium Carb-Cholecalciferol (CALCIUM 600 + D PO) Take 1 tablet by mouth daily.  ? Cholecalciferol (VITAMIN D PO) Take 1,000 Units by mouth daily.   ? gabapentin (NEURONTIN) 100 MG capsule Take 1 tablet at bedtime x 1 week, then increase to 2 tablet at bedtime.  ? loratadine (CLARITIN) 10 MG tablet Take 10 mg by mouth daily.  ? losartan (COZAAR) 25 MG tablet Take 1 tablet (25 mg total) by mouth daily.  ? magnesium 30 MG tablet Take 30 mg by mouth 3 (three) times a week.  ?  meloxicam (MOBIC) 7.5 MG tablet Take 1 tablet (7.5 mg total) by mouth daily as needed for pain.  ? polyethylene glycol (MIRALAX / GLYCOLAX) packet Take 17 g by mouth daily as needed for moderate constipation.   ? Vitamin A 2400 MCG (8000 UT) TABS Take 8,000 Units by mouth daily.   ? vitamin B-12 (CYANOCOBALAMIN) 1000 MCG tablet Take 1,000 mcg by mouth daily.  ? vitamin E 400 UNIT capsule Take 400 Units by mouth daily.  ? ?No facility-administered encounter medications on file as of 05/16/2021.  ? ? ?Allergies:  ?Allergies  ?Allergen Reactions  ? Metoprolol Nausea And Vomiting  ? Morphine And Related Nausea And Vomiting  ? Other Nausea And Vomiting  ? ? ?Family History: ?Family History   ?Problem Relation Age of Onset  ? Other Mother   ?     malaria  ? Other Father   ? Cancer Other   ?     stomach  ? ? ?Social History: ?Social History  ? ?Tobacco Use  ? Smoking status: Never  ? Smokeless tobacco: Never  ?Vaping Use  ? Vaping Use: Never used  ?Substance Use Topics  ? Alcohol use: Not Currently  ?  Alcohol/week: 5.0 standard drinks  ?  Types: 5 Glasses of wine per week  ? Drug use: No  ? ?Social History  ? ?Social History Narrative  ? ** Merged History Encounter ** Lives with husband in two-story home, has 3 sons and 5 grandchildren in [place] area  ?   ? Active with volunteering at school.   ? Enjoys gardening and using stationary bike to cycle  ? Right handed  ? Drinks caffeine  ? Two story home  ?   ? ? ?Vital Signs:  ?BP (!) 150/73   Pulse 70   Resp 18   Ht 5\' 2"  (1.575 m)   Wt 119 lb (54 kg)   BMI 21.77 kg/m?  ? ?Neurological Exam: ?MENTAL STATUS including orientation to time, place, person, recent and remote memory, attention span and concentration, language, and fund of knowledge is normal.  Speech is not dysarthric. ? ?CRANIAL NERVES: ?II:  No visual field defects.  ?III-IV-VI: Pupils equal round and reactive to light.  Normal conjugate, extra-ocular eye movements in all directions of gaze.  No nystagmus.  No ptosis.   ?V:  Normal facial sensation.    ?VII:  Normal facial symmetry and movements.   ?VIII:  Normal hearing and vestibular function.   ?IX-X:  Normal palatal movement.   ?XI:  Normal shoulder shrug and head rotation.   ?XII:  Normal tongue strength and range of motion, no deviation or fasciculation. ? ?MOTOR:  No atrophy, fasciculations or abnormal movements.  No pronator drift.  ? ?Upper Extremity:  Right  Left  ?Deltoid  5/5   5/5   ?Biceps  5/5   5/5   ?Triceps  5/5   5/5   ?Infraspinatus 5/5  5/5  ?Medial pectoralis 5/5  5/5  ?Wrist extensors  5/5   5/5   ?Wrist flexors  5/5   5/5   ?Finger extensors  5/5   5/5   ?Finger flexors  5/5   5/5   ?Dorsal interossei   5/5   5/5   ?Abductor pollicis  5/5   5/5   ?Tone (Ashworth scale)  0  0  ? ?Lower Extremity:  Right  Left  ?Hip flexors  5/5   5/5   ?Hip extensors  5/5   5/5   ?  Adductor 5/5  5/5  ?Abductor 5/5  5/5  ?Knee flexors  5/5   5/5   ?Knee extensors  5/5   5/5   ?Dorsiflexors  5/5   5/5   ?Plantarflexors  5/5   5/5   ?Toe extensors  5/5   5/5   ?Toe flexors  5/5   5/5   ?Tone (Ashworth scale)  0  0  ? ?MSRs:  ?Right        Left                  ?brachioradialis 2+  2+  ?biceps 2+  2+  ?triceps 2+  2+  ?patellar 2+  2+  ?ankle jerk 2+  2+  ?Hoffman no  no  ?plantar response down  down  ? ?SENSORY:  Normal and symmetric perception of light touch, pinprick, vibration, and proprioception.  Romberg's sign absent.  ? ?COORDINATION/GAIT: Normal finger-to- nose-finger.  Intact rapid alternating movements bilaterally. Gait narrow based and stable. Tandem and stressed gait intact.  ? ? ?IMPRESSION: ?Left leg and foot paresthesias, distribution of symptoms is suggest of possible L5-S1 radiculopathy ? - Start gabapentin 100mg  at bedtime x 1 week, then increase to 200mg  at bedtime ? - Start PT for low back strengthening ? - Patient to send a MyChart message in 1 month, consider further titration of gabapentin if tolerating ? - If symptoms do not improve with PT, next step is MRI lumbar spine and/or EMG left leg ? ?Return to clinic in 6 months ? ? ?Thank you for allowing me to participate in patient's care.  If I can answer any additional questions, I would be pleased to do so.   ? ?Sincerely, ? ? ? ?Raelee Rossmann K. Posey Pronto, DO ? ?

## 2021-05-16 NOTE — Patient Instructions (Addendum)
Start gabapentin 100mg  1 tablet at bedtime x 1 week, then increase to 2 tablet at bedtime. ? ?Start physical therapy for low back strengthening ? ?Send MyChart update in 1 month ? ?Return to clinic in September ? ? ?

## 2021-05-17 DIAGNOSIS — H532 Diplopia: Secondary | ICD-10-CM | POA: Diagnosis not present

## 2021-05-30 ENCOUNTER — Encounter: Payer: Self-pay | Admitting: Family Medicine

## 2021-06-09 DIAGNOSIS — Z20828 Contact with and (suspected) exposure to other viral communicable diseases: Secondary | ICD-10-CM | POA: Diagnosis not present

## 2021-06-10 DIAGNOSIS — M65311 Trigger thumb, right thumb: Secondary | ICD-10-CM | POA: Diagnosis not present

## 2021-06-14 DIAGNOSIS — H532 Diplopia: Secondary | ICD-10-CM | POA: Diagnosis not present

## 2021-06-14 DIAGNOSIS — H5201 Hypermetropia, right eye: Secondary | ICD-10-CM | POA: Diagnosis not present

## 2021-06-16 DIAGNOSIS — Z23 Encounter for immunization: Secondary | ICD-10-CM | POA: Diagnosis not present

## 2021-08-08 ENCOUNTER — Telehealth: Payer: Self-pay

## 2021-08-08 ENCOUNTER — Encounter: Payer: Self-pay | Admitting: Physician Assistant

## 2021-08-08 ENCOUNTER — Ambulatory Visit (INDEPENDENT_AMBULATORY_CARE_PROVIDER_SITE_OTHER): Payer: Medicare Other | Admitting: Physician Assistant

## 2021-08-08 VITALS — BP 176/80 | HR 62 | Temp 97.2°F | Ht 62.0 in | Wt 116.4 lb

## 2021-08-08 DIAGNOSIS — I10 Essential (primary) hypertension: Secondary | ICD-10-CM

## 2021-08-08 DIAGNOSIS — T63461A Toxic effect of venom of wasps, accidental (unintentional), initial encounter: Secondary | ICD-10-CM

## 2021-08-08 MED ORDER — PREDNISONE 20 MG PO TABS: 40.0000 mg | ORAL_TABLET | Freq: Every day | ORAL | 0 refills | Status: DC

## 2021-08-08 NOTE — Telephone Encounter (Signed)
--  Stung 3 times by hornets 2 days ago. Throat is sore this morning, no trouble breathing or swallowing  08/08/2021 8:38:03 AM Call PCP within 24 Hours Turner, RN, Rebekah  Referrals REFERRED TO PCP OFFICE  Pt has appt with Jarold Motto today at 2:40PM

## 2021-08-08 NOTE — Patient Instructions (Signed)
It was great to see you!  Start prednisone 40 mg x 5 days Start daily claritin (loratadine) x 1 week  Ice packs for swelling  If new/worsening symptoms, let me know  I'd love to have you as a patient, come see me when you are ready!  Take care,  Jarold Motto PA-C

## 2021-08-08 NOTE — Progress Notes (Signed)
Cassidy Bennett is a 81 y.o. female here for a new problem insect bite.   History of Present Illness:   Chief Complaint  Patient presents with   Insect Bite    On Rt forearm and left hand  Took benadryl with out helping  Stopped benadryl due to bloody cough and blood when blowing her nose     HPI   Wasp sting Patient complains of insect bite that happened about 2-3 days ago. Located on left forearm and right hand. States she was stung by wasp about 3 days ago. Symptoms seems to be getting worse. States redness seems to be getting worse as well. She has tried taking Benadryl every 6 hours with no mild relief. She has also been using neosporin cream with no relief.  The areas of itching are swollen and itching.  She notes she stopped taking Benadryl because she thought it caused a nose bleed.   She has not taken any other medication recently. Denies trouble breathing or swallowing. No pain. No fever or chills. Denies numbness or tingling. No reported neck stiffness or joint pain. Denies headaches. No other worsening symptoms.    HTN Currently taking losartan 25 mg. At home blood pressure readings are: not checked. Patient denies chest pain, SOB, blurred vision, dizziness, unusual headaches, lower leg swelling. Patient is compliant with medication. Denies excessive caffeine intake, stimulant usage, excessive alcohol intake, or increase in salt consumption.  BP Readings from Last 3 Encounters:  08/08/21 (!) 176/80  05/16/21 (!) 150/73  03/09/21 118/80     Past Medical History:  Diagnosis Date   Allergy    Complication of anesthesia    History of TB (tuberculosis)    treated 45 plus years ago last cxr 03-15-18  CXR every 5 years   Hypercholesteremia    denies at preop   Hypertension    Osteoarthritis    Osteoporosis    PONV (postoperative nausea and vomiting)    Post-menopausal    Tuberculosis      Social History   Tobacco Use   Smoking status: Never   Smokeless  tobacco: Never  Vaping Use   Vaping Use: Never used  Substance Use Topics   Alcohol use: Not Currently    Alcohol/week: 5.0 standard drinks of alcohol    Types: 5 Glasses of wine per week   Drug use: No    Past Surgical History:  Procedure Laterality Date   CATARACT EXTRACTION  2018   right eye   COLONOSCOPY  2015   DILATION AND CURETTAGE OF UTERUS  2000   KNEE ARTHROSCOPY  2014   left   LUMBAR DISC SURGERY     SHOULDER SURGERY     right   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   Procedure: TOTAL KNEE ARTHROPLASTY;  Surgeon: Ollen Gross, MD;  Location: [facility];  Service: Orthopedics;  Laterality: Left;    Family History  Problem Relation Age of Onset   Other Mother        malaria   Other Father    Cancer Other        stomach    Allergies  Allergen Reactions   Metoprolol Nausea And Vomiting   Morphine And Related Nausea And Vomiting   Other Nausea And Vomiting    Current Medications:   Current Outpatient Medications:    Ascorbic Acid (VITAMIN C PO), Take 1,000 mg by mouth daily. , Disp: , Rfl:    atenolol (TENORMIN) 25 MG tablet, TAKE 1 TABLET  BY MOUTH EVERY DAY, Disp: 90 tablet, Rfl: 1   b complex vitamins tablet, Take 1 tablet by mouth daily. Every other day, Disp: , Rfl:    Calcium Carb-Cholecalciferol (CALCIUM 600 + D PO), Take 1 tablet by mouth daily., Disp: , Rfl:    Cholecalciferol (VITAMIN D PO), Take 1,000 Units by mouth daily. , Disp: , Rfl:    gabapentin (NEURONTIN) 100 MG capsule, Take 1 tablet at bedtime x 1 week, then increase to 2 tablet at bedtime., Disp: 60 capsule, Rfl: 5   loratadine (CLARITIN) 10 MG tablet, Take 10 mg by mouth daily., Disp: , Rfl:    losartan (COZAAR) 25 MG tablet, Take 1 tablet (25 mg total) by mouth daily., Disp: 90 tablet, Rfl: 1   magnesium 30 MG tablet, Take 30 mg by mouth 3 (three) times a week., Disp: , Rfl:    meloxicam (MOBIC) 7.5 MG tablet, Take 1 tablet (7.5 mg total) by mouth daily as needed for pain., Disp: 30  tablet, Rfl: 2   polyethylene glycol (MIRALAX / GLYCOLAX) packet, Take 17 g by mouth daily as needed for moderate constipation. , Disp: , Rfl:    predniSONE (DELTASONE) 20 MG tablet, Take 2 tablets (40 mg total) by mouth daily., Disp: 10 tablet, Rfl: 0   Vitamin A 2400 MCG (8000 UT) TABS, Take 8,000 Units by mouth daily. , Disp: , Rfl:    vitamin B-12 (CYANOCOBALAMIN) 1000 MCG tablet, Take 1,000 mcg by mouth daily., Disp: , Rfl:    vitamin E 400 UNIT capsule, Take 400 Units by mouth daily., Disp: , Rfl:    Review of Systems:   ROS Negative unless otherwise specified per HPI.   Vitals:   Vitals:   08/08/21 1442 08/08/21 1500  BP: (!) 192/76 (!) 176/80  Pulse: 61 62  Temp: (!) 97.2 F (36.2 C)   TempSrc: Temporal   SpO2: 98%   Weight: 116 lb 6.4 oz (52.8 kg)   Height: 5\' 2"  (1.575 m)      Body mass index is 21.29 kg/m.  Physical Exam:   Physical Exam Vitals and nursing note reviewed.  Constitutional:      General: She is not in acute distress.    Appearance: She is well-developed. She is not ill-appearing or toxic-appearing.  Cardiovascular:     Rate and Rhythm: Normal rate and regular rhythm.     Pulses: Normal pulses.     Heart sounds: Normal heart sounds, S1 normal and S2 normal.     Comments: Normal fingertip cap refill b/l Pulmonary:     Effort: Pulmonary effort is normal.     Breath sounds: Normal breath sounds.  Musculoskeletal:     Comments: Normal ROM of bilateral arms and hands  Skin:    General: Skin is warm and dry.     Comments: Erythematous area of swelling to left forearm without warmth or discharge  Smaller area of erythema to right wrist without warmth or discharge  Neurological:     Mental Status: She is alert.     GCS: GCS eye subscore is 4. GCS verbal subscore is 5. GCS motor subscore is 6.     Comments: Normal sensation to b/l arms/fingers  Psychiatric:        Speech: Speech normal.        Behavior: Behavior normal. Behavior is cooperative.      Assessment and Plan:   Wasp sting, accidental or unintentional, initial encounter No red flags Suspect inflammation from insect sting --  does not appear to be infectious Start oral prednisone 20 mg BID x 5 days Recommend daily claritin x 1 week Ice packs for itching/swelling If new/worsening symptoms, needs to let us know vs seek urgent/emergent care  Essential hypertension Above goal No red flags on exam -- she is in NAD and does not appear to have any end organ damage Continue losartan 25 mg daily Check blood pressure at home and if it does not improve, recommend follow-up with [place] for further evaluation If any chest pain/SOB/severe HA/vision changes -- she was instructed to go to the ER  I,Savera Zaman,acting as a scribe for [employer], PA.,have documented all relevant documentation on the behalf of Jarold Motto, PA,as directed by  Jarold Motto, PA while in the presence of Jarold Motto, [place].   I, Jarold Motto, [place], have reviewed all documentation for this visit. The documentation on 08/08/21 for the exam, diagnosis, procedures, and orders are all accurate and complete.  Jarold Motto, PA-C

## 2021-08-31 ENCOUNTER — Other Ambulatory Visit: Payer: Self-pay | Admitting: Physician Assistant

## 2021-08-31 DIAGNOSIS — Z1231 Encounter for screening mammogram for malignant neoplasm of breast: Secondary | ICD-10-CM

## 2021-09-05 ENCOUNTER — Other Ambulatory Visit: Payer: Self-pay | Admitting: *Deleted

## 2021-09-05 DIAGNOSIS — I10 Essential (primary) hypertension: Secondary | ICD-10-CM

## 2021-09-05 MED ORDER — ATENOLOL 25 MG PO TABS: 25.0000 mg | ORAL_TABLET | Freq: Every day | ORAL | 1 refills | Status: DC

## 2021-09-06 ENCOUNTER — Ambulatory Visit
Admission: RE | Admit: 2021-09-06 | Discharge: 2021-09-06 | Disposition: A | Discharge status: Home / Self Care | Payer: Medicare Other | Source: Ambulatory Visit | Attending: Physician Assistant | Admitting: Physician Assistant

## 2021-09-06 DIAGNOSIS — Z1231 Encounter for screening mammogram for malignant neoplasm of breast: Secondary | ICD-10-CM

## 2021-09-15 DIAGNOSIS — Z96652 Presence of left artificial knee joint: Secondary | ICD-10-CM | POA: Diagnosis not present

## 2021-09-15 DIAGNOSIS — M1711 Unilateral primary osteoarthritis, right knee: Secondary | ICD-10-CM | POA: Diagnosis not present

## 2021-09-27 ENCOUNTER — Ambulatory Visit: Payer: Medicare Other

## 2021-10-04 ENCOUNTER — Ambulatory Visit: Payer: Medicare Other | Admitting: Physician Assistant

## 2021-10-10 ENCOUNTER — Other Ambulatory Visit: Payer: Self-pay | Admitting: *Deleted

## 2021-10-10 MED ORDER — LOSARTAN POTASSIUM 25 MG PO TABS: 25.0000 mg | ORAL_TABLET | Freq: Every day | ORAL | 0 refills | Status: DC

## 2021-10-14 ENCOUNTER — Other Ambulatory Visit: Payer: Self-pay | Admitting: Physician Assistant

## 2021-10-14 ENCOUNTER — Ambulatory Visit (INDEPENDENT_AMBULATORY_CARE_PROVIDER_SITE_OTHER): Payer: Medicare Other | Admitting: Physician Assistant

## 2021-10-14 ENCOUNTER — Encounter: Payer: Self-pay | Admitting: Physician Assistant

## 2021-10-14 VITALS — BP 150/90 | HR 85 | Temp 98.0°F | Ht 62.0 in | Wt 114.4 lb

## 2021-10-14 DIAGNOSIS — E781 Pure hyperglyceridemia: Secondary | ICD-10-CM

## 2021-10-14 DIAGNOSIS — I10 Essential (primary) hypertension: Secondary | ICD-10-CM | POA: Diagnosis not present

## 2021-10-14 DIAGNOSIS — G629 Polyneuropathy, unspecified: Secondary | ICD-10-CM

## 2021-10-14 DIAGNOSIS — R7309 Other abnormal glucose: Secondary | ICD-10-CM

## 2021-10-14 DIAGNOSIS — M810 Age-related osteoporosis without current pathological fracture: Secondary | ICD-10-CM

## 2021-10-14 DIAGNOSIS — Z0001 Encounter for general adult medical examination with abnormal findings: Secondary | ICD-10-CM

## 2021-10-14 LAB — CBC WITH DIFFERENTIAL/PLATELET
Basophils Absolute: 0 10*3/uL (ref 0.0–0.1)
Basophils Relative: 0.6 % (ref 0.0–3.0)
Eosinophils Absolute: 0.1 10*3/uL (ref 0.0–0.7)
Eosinophils Relative: 1.5 % (ref 0.0–5.0)
HCT: 39.7 % (ref 36.0–46.0)
Hemoglobin: 13.6 g/dL (ref 12.0–15.0)
Lymphocytes Relative: 31.5 % (ref 12.0–46.0)
Lymphs Abs: 2.1 10*3/uL (ref 0.7–4.0)
MCHC: 34.1 g/dL (ref 30.0–36.0)
MCV: 80.8 fl (ref 78.0–100.0)
Monocytes Absolute: 0.4 10*3/uL (ref 0.1–1.0)
Monocytes Relative: 6.2 % (ref 3.0–12.0)
Neutro Abs: 4.1 10*3/uL (ref 1.4–7.7)
Neutrophils Relative %: 60.2 % (ref 43.0–77.0)
Platelets: 202 10*3/uL (ref 150.0–400.0)
RBC: 4.91 Mil/uL (ref 3.87–5.11)
RDW: 15.1 % (ref 11.5–15.5)
WBC: 6.7 10*3/uL (ref 4.0–10.5)

## 2021-10-14 LAB — COMPREHENSIVE METABOLIC PANEL
ALT: 25 U/L (ref 0–35)
AST: 24 U/L (ref 0–37)
Albumin: 4.5 g/dL (ref 3.5–5.2)
Alkaline Phosphatase: 77 U/L (ref 39–117)
BUN: 20 mg/dL (ref 6–23)
CO2: 28 mEq/L (ref 19–32)
Calcium: 9.5 mg/dL (ref 8.4–10.5)
Chloride: 100 mEq/L (ref 96–112)
Creatinine, Ser: 0.65 mg/dL (ref 0.40–1.20)
GFR: 82.37 mL/min (ref >60.00)
Glucose, Bld: 88 mg/dL (ref 70–99)
Potassium: 4.4 mEq/L (ref 3.5–5.1)
Sodium: 137 mEq/L (ref 135–145)
Total Bilirubin: 0.6 mg/dL (ref 0.2–1.2)
Total Protein: 7.8 g/dL (ref 6.0–8.3)

## 2021-10-14 LAB — POCT GLYCOSYLATED HEMOGLOBIN (HGB A1C): Hemoglobin A1C: 6.2 % — AB (ref 4.0–5.6)

## 2021-10-14 LAB — LDL CHOLESTEROL, DIRECT: Direct LDL: 89 mg/dL

## 2021-10-14 LAB — LIPID PANEL
Cholesterol: 240 mg/dL — ABNORMAL HIGH (ref 0–200)
HDL: 47.8 mg/dL (ref >39.00)
Total CHOL/HDL Ratio: 5
Triglycerides: 848 mg/dL — ABNORMAL HIGH (ref 0.0–149.0)

## 2021-10-14 MED ORDER — AMLODIPINE BESYLATE 5 MG PO TABS: 5.0000 mg | ORAL_TABLET | Freq: Every day | ORAL | 1 refills | Status: DC

## 2021-10-14 NOTE — Patient Instructions (Addendum)
It was great to see you!  [facility] for [facility] BONE DENSITY SCAN  Stop your losartan 25 mg daily  Start amlodopine 5 mg daily Check blood pressure a few times a week If blood pressure is not under 150/90, please let me know  Let's follow-up in 3 months for your blood pressure, sooner if you have concerns.  Take care,  Jarold Motto PA-C

## 2021-10-14 NOTE — Progress Notes (Signed)
Subjective:    Cassidy Bennett is a 81 y.o. female and is here for a comprehensive physical exam.   HPI  There are no preventive care reminders to display for this patient.  Acute Concerns: None  Chronic Issues: HTN Currently taking losartan 25 mg and atenolol 25 mg daily. At home blood pressure readings are: 140/80. Patient denies chest pain, SOB, blurred vision, dizziness, unusual headaches, lower leg swelling. Patient is not compliant with medication -- she does not like taking the losartan because she feels as though the combination of this with the gabapentin makes her feel "off" in the mornings. She does not take atenolol because she does not think that it works for her. Denies excessive caffeine intake, stimulant usage, excessive alcohol intake, or increase in salt consumption.  BP Readings from Last 3 Encounters:  10/14/21 (!) 150/80  08/08/21 (!) 176/80  05/16/21 (!) 150/73   Insulin resistance She would like her A1c checked. She has concerns because both of her hands shake in the morning if she has not had much to eat the night before. She has been skipping dinner to lose weight because she was getting up to 118 lb and she feels better around 115 lb. If she eats enough food at night, she doesn't have tremors in the morning. She feels like she is always thirsty and having to urinate.  Numbness of left foot She saw neuro in March 2023 for this and was prescribed gabapentin 100-200 mg nightly. This works well for her.  Osteoporosis This was found on her last DEXA. It looks like her prior PCP was setting her up for Prolia but it was going to be too expensive for her so she did not do this. Due for DEXA.  Health Maintenance: Immunizations -- UTD Colonoscopy -- n/a Mammogram -- n/a PAP -- n/a Bone Density -- June 2021 -- osteoporosis Diet -- overall healthy -- a bit restrictive at times Sleep habits -- has tried melatonin  Exercise -- walk and bike daily Weight --  Weight: 114 lb 6.1 oz (51.9 kg)  Mood -- stable Weight history: Wt Readings from Last 10 Encounters:  10/14/21 114 lb 6.1 oz (51.9 kg)  08/08/21 116 lb 6.4 oz (52.8 kg)  05/16/21 119 lb (54 kg)  03/09/21 117 lb 3.2 oz (53.2 kg)  11/03/20 111 lb 8 oz (50.6 kg)  09/14/20 117 lb 14.4 oz (53.5 kg)  08/13/20 115 lb 3.2 oz (52.3 kg)  06/21/20 117 lb (53.1 kg)  09/11/19 117 lb (53.1 kg)  03/12/19 119 lb 1.6 oz (54 kg)   Body mass index is 20.92 kg/m. No LMP recorded. Patient is postmenopausal. Alcohol use:  reports that she does not currently use alcohol after a past usage of about 5.0 standard drinks of alcohol per week. Tobacco use:  Tobacco Use: Low Risk  (10/14/2021)   Patient History    Smoking Tobacco Use: Never    Smokeless Tobacco Use: Never    Passive Exposure: Not on file        10/14/2021    1:06 PM  Depression screen PHQ 2/9  Decreased Interest 0  Down, Depressed, Hopeless 0  PHQ - 2 Score 0     Other providers/specialists: Patient Care Team: Jarold Motto, [place] as PCP - General (Physician Assistant) Mckinley Jewel, MD as Consulting Physician (Ophthalmology)    PMHx, SurgHx, SocialHx, Medications, and Allergies were reviewed in the Visit Navigator and updated as appropriate.   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   Procedure: TOTAL KNEE ARTHROPLASTY;   Surgeon: Ollen Gross, MD;  Location: [facility];  Service: Orthopedics;  Laterality: Left;     Family History  Problem Relation Age of Onset   Other Mother        malaria   Other Father    Cancer Other        stomach   Breast cancer Neg Hx     Social History   Tobacco Use   Smoking status: Never   Smokeless tobacco: Never  Vaping Use   Vaping Use: Never used  Substance Use Topics   Alcohol use: Not Currently    Alcohol/week: 5.0 standard drinks of alcohol    Types: 5 Glasses of wine per week   Drug use: No    Review of Systems:   Review of Systems  Constitutional:  Negative for chills, fever, malaise/fatigue and weight loss.  HENT:  Negative for hearing loss, sinus pain and sore throat.   Respiratory:  Negative for cough and hemoptysis.   Cardiovascular:  Negative for chest pain, palpitations, leg swelling and PND.  Gastrointestinal:  Negative for abdominal pain, constipation, diarrhea, heartburn, nausea and vomiting.  Genitourinary:  Negative for dysuria, frequency and urgency.  Musculoskeletal:  Negative for back pain, myalgias and neck pain.  Skin:  Negative for itching and rash.  Neurological:  Negative for dizziness, tingling, seizures and headaches.  Endo/Heme/Allergies:  Negative for polydipsia.  Psychiatric/Behavioral:  Negative for depression. The patient is not nervous/anxious.     Objective:   BP (!) 150/80 (BP Location: Left Arm, Patient Position: Sitting, Cuff Size: Normal)   Pulse 85   Temp 98 F (36.7 C) (Temporal)   Ht 5\' 2"  (1.575 m)   Wt 114 lb 6.1 oz (51.9 kg)   SpO2 97%   BMI 20.92 kg/m  Body mass index is 20.92 kg/m.   General Appearance:    Alert, cooperative, no distress, appears stated age  Head:    Normocephalic, without obvious abnormality, atraumatic  Eyes:    PERRL, conjunctiva/corneas clear, EOM's intact, fundi    benign, both eyes  Ears:    Normal TM's and external ear canals, both ears  Nose:   Nares normal, septum midline,  mucosa normal, no drainage    or sinus tenderness  Throat:   Lips, mucosa, and tongue normal; teeth and gums normal  Neck:   Supple, symmetrical, trachea midline, no adenopathy;    thyroid:  no enlargement/tenderness/nodules; no carotid   bruit or JVD  Back:     Symmetric, no curvature, ROM normal, no CVA tenderness  Lungs:     Clear to auscultation bilaterally, respirations unlabored  Chest Wall:    No tenderness or deformity  Heart:    Regular rate and rhythm, S1 and S2 normal, no murmur, rub or gallop  Breast Exam:    Deferred  Abdomen:     Soft, non-tender, bowel sounds active all four quadrants,    no masses, no organomegaly  Genitalia:   Deferred  Extremities:   Extremities normal, atraumatic, no cyanosis or edema  Pulses:   2+ and symmetric all extremities  Skin:   Skin color, texture, turgor normal, no rashes or lesions  Lymph nodes:   Cervical, supraclavicular, and axillary nodes normal  Neurologic:   CNII-XII intact, normal strength, sensation and reflexes    throughout    Assessment/Plan:   Encounter for general adult medical examination with abnormal findings Today patient counseled on age appropriate routine health concerns for screening and prevention, each reviewed and up to date or declined. Immunizations reviewed and up to date or declined. Labs ordered and reviewed. Risk factors for depression reviewed and negative. Hearing function and visual acuity are intact. ADLs screened and addressed as needed. Functional ability and level of safety reviewed and appropriate. Education, counseling and referrals performed based on assessed risks today. Patient provided with a copy of personalized plan for preventive services.  Essential hypertension Above goal Non-compliant We will stop both medications and trial amlodopine 5 mg daily Check bp at home and record log Follow-up in 3 months, sooner if concerns  Elevated glucose A1c in office 6.2% -- she is relieved Continue  efforts at healthy lifestyle  Osteoporosis without current pathological fracture, unspecified osteoporosis type Bone density scan due She would like to await the results of this prior to starting medication She seems more interested in oral med if possible Orders for DEXA placed  Neuropathy Well controlled with prn gabapentin Follow-up with neuro prn  Patient Counseling: [x]    Nutrition: Stressed importance of moderation in sodium/caffeine intake, saturated fat and cholesterol, caloric balance, sufficient intake of fresh fruits, vegetables, fiber, calcium, iron, and 1 mg of folate supplement per day (for females capable of pregnancy).  [x]    Stressed the importance of regular exercise.   [x]    Substance Abuse: Discussed cessation/primary prevention of tobacco, alcohol, or other drug use; driving or other dangerous activities under the influence; availability of treatment for abuse.   [x]    Injury prevention: Discussed safety belts, safety helmets, smoke detector, smoking near bedding or upholstery.   [x]    Sexuality: Discussed sexually transmitted diseases, partner selection, use of condoms, avoidance of unintended pregnancy  and contraceptive alternatives.  [x]    Dental health: Discussed importance of regular tooth brushing, flossing, and dental visits.  [x]    Health maintenance and immunizations reviewed. Please refer to Health maintenance section.    , PA-C [place] [facility]

## 2021-10-27 ENCOUNTER — Other Ambulatory Visit (INDEPENDENT_AMBULATORY_CARE_PROVIDER_SITE_OTHER): Payer: Medicare Other

## 2021-10-27 DIAGNOSIS — E781 Pure hyperglyceridemia: Secondary | ICD-10-CM | POA: Diagnosis not present

## 2021-10-27 LAB — LIPID PANEL
Cholesterol: 222 mg/dL — ABNORMAL HIGH (ref 0–200)
HDL: 60.7 mg/dL (ref >39.00)
LDL Cholesterol: 134 mg/dL — ABNORMAL HIGH (ref 0–99)
NonHDL: 161.18
Total CHOL/HDL Ratio: 4
Triglycerides: 136 mg/dL (ref 0.0–149.0)
VLDL: 27.2 mg/dL (ref 0.0–40.0)

## 2021-11-01 ENCOUNTER — Ambulatory Visit: Payer: Medicare Other

## 2021-11-04 DIAGNOSIS — Z23 Encounter for immunization: Secondary | ICD-10-CM | POA: Diagnosis not present

## 2021-11-04 NOTE — Progress Notes (Unsigned)
Follow-up Visit   Date: 11/07/2021    Cassidy Bennett MRN: [id number] DOB: 21-Dec-1940    Cassidy Bennett is a 81 y.o. female with hyperlipidemia and OA returning to the clinic for follow-up of left foot numbness/tingling.  The patient was accompanied to the clinic by husband who also provides collateral information.     IMPRESSION/PLAN: Right sided low back pain with sciatica  - Start tizanidine 2mg  at bedtime as needed  - Start physical therapy for low back strengthening  - If symptoms do not improve with PT, next step is MRI lumbar spine  2.  Left leg radicular pain and paresthesias, suggestive of possible L5-S1 nerve root impingement  - Continue gabapentin 100mg  at bedtime (sedation at higher dose)  Return to clinic in 3 months  --------------------------------------------- History of present illness: Starting around January 2023, she started having numbness/tingling involving the toes and balls of the left foot.  Symptoms occur intermittently and worse with prolonged standing.  Rest tends to alleviate symptoms.  She has mild low back pain.  She also had tingling over the left lateral leg from the knee down, it is worse with localized pressure around the anterior knee and started after having right TKA two years ago.    UPDATE 11/07/2021: She is here for follow-up.  She has not completed PT as therapy did not contact her for appointment. Overall, she has noticed that tingling in the left leg is less, it remains in the left toe.  Over the past several weeks, she has been having more right sided back pain radiating in to the leg.  She has taken gabapentin 100mg  at bedtime and meloxicam.  She became very sedated on higher dose of gabapentin.  No numbness/tingling in the right leg.   Medications:  Current Outpatient Medications on File Prior to Visit  Medication Sig Dispense Refill   amLODipine (NORVASC) 5 MG tablet Take 1 tablet (5 mg total) by mouth daily. 90 tablet  1   Ascorbic Acid (VITAMIN C PO) Take 1,000 mg by mouth daily.      b complex vitamins tablet Take 1 tablet by mouth daily. Every other day     Calcium Carb-Cholecalciferol (CALCIUM 600 + D PO) Take 1 tablet by mouth daily.     gabapentin (NEURONTIN) 100 MG capsule Take 1 tablet at bedtime x 1 week, then increase to 2 tablet at bedtime. (Patient taking differently: Take 1 tablet at bedtime x 1 week, then increase to 2 tablet at bedtime. Takes prn) 60 capsule 5   magnesium 30 MG tablet Take 30 mg by mouth 3 (three) times a week.     meloxicam (MOBIC) 7.5 MG tablet Take 7.5 mg by mouth daily as needed.     polyethylene glycol (MIRALAX / GLYCOLAX) packet Take 17 g by mouth daily as needed for moderate constipation.      Vitamin A 2400 MCG (8000 UT) TABS Take 8,000 Units by mouth daily.      vitamin B-12 (CYANOCOBALAMIN) 1000 MCG tablet Take 1,000 mcg by mouth daily.     vitamin E 400 UNIT capsule Take 400 Units by mouth daily.     No current facility-administered medications on file prior to visit.    Allergies:  Allergies  Allergen Reactions   Metoprolol Nausea And Vomiting   Morphine And Related Nausea And Vomiting   Other Nausea And Vomiting    Vital Signs:  BP (!) 159/79   Pulse 84   Ht 5\' 2"  (  1.575 m)   Wt 114 lb (51.7 kg)   SpO2 98%   BMI 20.85 kg/m    Neurological Exam: MENTAL STATUS including orientation to time, place, person, recent and remote memory, attention span and concentration, language, and fund of knowledge is normal.  Speech is not dysarthric.  CRANIAL NERVES:  No visual field defects.  Pupils equal round and reactive to light.  Normal conjugate, extra-ocular eye movements in all directions of gaze.  No ptosis.  Face is symmetric.  MOTOR:  Motor strength is 5/5 in all extremities.  No atrophy, fasciculations or abnormal movements.  No pronator drift.  Tone is normal.  Tenderness to palpation over the right SI joint.  MSRs:  Reflexes are 2+/4  throughout.  SENSORY:  Intact to vibration throughout.  COORDINATION/GAIT:   Gait appears antalgic, unassisted, stable.  Data: n/a    Thank you for allowing me to participate in patient's care.  If I can answer any additional questions, I would be pleased to do so.    Sincerely,    Athan Casalino K. Allena Katz, DO

## 2021-11-07 ENCOUNTER — Ambulatory Visit (INDEPENDENT_AMBULATORY_CARE_PROVIDER_SITE_OTHER): Payer: Medicare Other | Admitting: Neurology

## 2021-11-07 ENCOUNTER — Encounter: Payer: Self-pay | Admitting: Neurology

## 2021-11-07 VITALS — BP 159/79 | HR 84 | Ht 62.0 in | Wt 114.0 lb

## 2021-11-07 DIAGNOSIS — G8929 Other chronic pain: Secondary | ICD-10-CM

## 2021-11-07 DIAGNOSIS — M5417 Radiculopathy, lumbosacral region: Secondary | ICD-10-CM | POA: Diagnosis not present

## 2021-11-07 DIAGNOSIS — M5441 Lumbago with sciatica, right side: Secondary | ICD-10-CM

## 2021-11-07 MED ORDER — TIZANIDINE HCL 2 MG PO CAPS: 2.0000 mg | ORAL_CAPSULE | Freq: Every evening | ORAL | 3 refills | Status: DC | PRN

## 2021-11-07 NOTE — Patient Instructions (Addendum)
Start physical therapy  Start tizanidine 2mg  at bedtime as needed for pain  Continue gabapentin 100mg  at bedtime  Apply heat/ice to your back  Return to clinic in 3 months

## 2021-11-15 ENCOUNTER — Encounter: Payer: Self-pay | Admitting: Neurology

## 2021-11-16 DIAGNOSIS — Z23 Encounter for immunization: Secondary | ICD-10-CM | POA: Diagnosis not present

## 2021-12-06 ENCOUNTER — Encounter (HOSPITAL_COMMUNITY): Payer: Self-pay | Admitting: Physical Therapy

## 2021-12-06 ENCOUNTER — Ambulatory Visit (HOSPITAL_COMMUNITY): Payer: Medicare Other | Attending: Neurology | Admitting: Physical Therapy

## 2021-12-06 DIAGNOSIS — M5441 Lumbago with sciatica, right side: Secondary | ICD-10-CM | POA: Insufficient documentation

## 2021-12-06 DIAGNOSIS — R2689 Other abnormalities of gait and mobility: Secondary | ICD-10-CM | POA: Insufficient documentation

## 2021-12-06 DIAGNOSIS — G8929 Other chronic pain: Secondary | ICD-10-CM | POA: Insufficient documentation

## 2021-12-06 DIAGNOSIS — R29898 Other symptoms and signs involving the musculoskeletal system: Secondary | ICD-10-CM | POA: Diagnosis not present

## 2021-12-06 DIAGNOSIS — M5417 Radiculopathy, lumbosacral region: Secondary | ICD-10-CM | POA: Diagnosis not present

## 2021-12-06 DIAGNOSIS — M6281 Muscle weakness (generalized): Secondary | ICD-10-CM | POA: Diagnosis not present

## 2021-12-06 DIAGNOSIS — M5459 Other low back pain: Secondary | ICD-10-CM | POA: Diagnosis not present

## 2021-12-06 NOTE — Therapy (Signed)
OUTPATIENT PHYSICAL THERAPY THORACOLUMBAR EVALUATION   Patient Name: AVIELLA DISBROW MRN: [id number] DOB:Feb 10, 1941, 81 y.o., female Today's Date: 12/06/2021   PT End of Session - 12/06/21 0820     Visit Number 1    Number of Visits 12    Date for PT Re-Evaluation 01/17/22    Authorization Type Primary Medicare Secondary BCBS    Progress Note Due on Visit 10    PT Start Time 0820    PT Stop Time 0857    PT Time Calculation (min) 37 min    Activity Tolerance Patient tolerated treatment well    Behavior During Therapy [facility] for tasks assessed/performed             Past Medical History:  Diagnosis Date   Allergy    Complication of anesthesia    History of TB (tuberculosis)    treated 45 plus years ago last cxr 03-15-18  CXR every 5 years   Hypercholesteremia    denies at preop   Hypertension    Osteoarthritis    Osteoporosis    PONV (postoperative nausea and vomiting)    Post-menopausal    Tuberculosis    Past Surgical History:  Procedure Laterality Date   CATARACT EXTRACTION  2018   right eye   COLONOSCOPY  2015   DILATION AND CURETTAGE OF UTERUS  2000   KNEE ARTHROSCOPY  2014   left   LUMBAR DISC SURGERY     SHOULDER SURGERY     right   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   Procedure: TOTAL KNEE ARTHROPLASTY;  Surgeon: Ollen Gross, MD;  Location: [facility];  Service: Orthopedics;  Laterality: Left;   Patient Active Problem List   Diagnosis Date Noted   Osteoarthritis of left knee 08/12/2018   Closed displaced fracture of proximal phalanx of right great toe 01/03/2017   Rotator cuff syndrome of right shoulder 01/03/2017   Hyperlipidemia 09/05/2016   Essential hypertension 08/27/2015   Urine abnormality 03/29/2015   Routine general medical examination at a health care facility 06/26/2013   History of TB (tuberculosis) 06/17/2012   Seasonal allergies 01/22/2008   Osteoporosis 12/13/2006   OA (osteoarthritis) of knee 10/11/2006    PCP: Jarold Motto PA  REFERRING PROVIDER: Glendale Chard, DO   REFERRING DIAG: [phone] (ICD-10-CM) - Lumbosacral radiculopathy M54.41,G89.29 (ICD-10-CM) - Chronic right-sided low back pain with right-sided sciatica   Rationale for Evaluation and Treatment Rehabilitation  THERAPY DIAG:  Other low back pain  Muscle weakness (generalized)  Other abnormalities of gait and mobility  Other symptoms and signs involving the musculoskeletal system  ONSET DATE: January 2023  SUBJECTIVE:  SUBJECTIVE STATEMENT: Patient states symptoms in low back and L leg but also right knee. Patient states hx of back surgery about 20 years ago. She had history of L TKA. Patient states increased pain with sleeping. She has tingling in toes. Tingling has been better with gabapentin. She does some back bending which feels better. Increased pain with sitting. Can stand for about 15 minutes before symptoms start in leg.  PERTINENT HISTORY:  Hx L TKA, HTN, osteoporosis, HLD, hx TB  PAIN:  Are you having pain? Yes: NPRS scale: 6/10 Pain location: low back Pain description: sore Aggravating factors: sleeping, sitting Relieving factors: meds, movement   PRECAUTIONS: None  WEIGHT BEARING RESTRICTIONS No  FALLS:  Has patient fallen in last 6 months? No  LIVING ENVIRONMENT: Lives with: lives with their spouse Lives in: House/apartment Stairs: Yes: Internal: 13 steps; on left going up Has following equipment at home: Single point cane  OCCUPATION: Retired  PLOF: Independent  PATIENT GOALS feel better   OBJECTIVE:   DIAGNOSTIC FINDINGS:  XR 08/13/20 IMPRESSION: 1. Prominent spondylosis and facet hypertrophy at the lumbosacral junction. No acute bony abnormality.  PATIENT SURVEYS:  FOTO complete next session  SCREENING FOR  RED FLAGS: Bowel or bladder incontinence: No Spinal tumors: No Cauda equina syndrome: No Compression fracture: No Abdominal aneurysm: No  COGNITION:  Overall cognitive status: Within functional limits for tasks assessed     SENSATION: WFL    POSTURE: rounded shoulders, forward head, decreased lumbar lordosis, and decreased thoracic kyphosis  PALPATION: Grossly hypomobile thoracic and lumbar spine, tender throughout thoracic/lumbar CPA; TTP lower lumbar paraspinals and L glute max  LUMBAR ROM:   Active  A/PROM  eval  Flexion 0% limited *  Extension 75% limited - felt good  Right lateral flexion 75% limited *  Left lateral flexion 75% limited *  Right rotation 50% limited *  Left rotation 50% limited *   (Blank rows = not tested) *= pain  LOWER EXTREMITY ROM:   WFL for tasks assessed, decreased hip extension bilaterally  Active  Right eval Left eval  Hip flexion    Hip extension    Hip abduction    Hip adduction    Hip internal rotation    Hip external rotation    Knee flexion    Knee extension    Ankle dorsiflexion    Ankle plantarflexion    Ankle inversion    Ankle eversion     (Blank rows = not tested)  LOWER EXTREMITY MMT:    MMT Right eval Left eval  Hip flexion 4- 4-  Hip extension 3+ 3  Hip abduction 4- 3+  Hip adduction    Hip internal rotation    Hip external rotation    Knee flexion 4 4  Knee extension 4 4+  Ankle dorsiflexion 4- 4+  Ankle plantarflexion    Ankle inversion    Ankle eversion     (Blank rows = not tested)   FUNCTIONAL TESTS:  5 times sit to stand: 12.30 seconds without UE support 2 minute walk test: 265 feet  GAIT: Distance walked: 265 feet Assistive device utilized: None Level of assistance: Complete Independence Comments: , slight trunk flexion and R shift, slower cadence with gradually increasing LBP and LLE symptoms    TODAY'S TREATMENT  12/06/21 POE 3 x 20 second holds Bridge 2x 10  Clam 2 x 10   Standing lumbar extension 2 x 10    PATIENT EDUCATION:  Education details: Patient educated on  exam findings, POC, scope of PT, HEP, and continuing to exercise. Person educated: Patient Education method: Explanation, Demonstration, and Handouts Education comprehension: verbalized understanding, returned demonstration, verbal cues required, and tactile cues required   HOME EXERCISE PROGRAM: Access Code: [email or web address]  Date: 12/06/2021 - Supine Bridge  - 2-3 x daily - 7 x weekly - 2-3 sets - 10 reps - Clamshell  - 2-3 x daily - 7 x weekly - 2-3 sets - 10 reps - Prone Press Up On Elbows  - 2-3 x daily - 7 x weekly - 5 reps - 20 second hold - Standing Lumbar Extension  - 2-3 x daily - 7 x weekly - 2 sets - 10 reps  ASSESSMENT:  CLINICAL IMPRESSION: Patient a 81 y.o. y.o. female who was seen today for physical therapy evaluation and treatment for lumbosacral radiculopathy. Patient presents with pain limited deficits in lumbar spine strength, ROM, endurance, activity tolerance, and functional mobility with ADL. Patient is having to modify and restrict ADL as indicated by outcome measure score as well as subjective information and objective measures which is affecting overall participation. Patient will benefit from skilled physical therapy in order to improve function and reduce impairment.   OBJECTIVE IMPAIRMENTS Abnormal gait, decreased activity tolerance, decreased balance, decreased endurance, decreased mobility, difficulty walking, decreased ROM, decreased strength, increased muscle spasms, impaired flexibility, improper body mechanics, postural dysfunction, and pain.   ACTIVITY LIMITATIONS carrying, lifting, bending, standing, squatting, stairs, transfers, locomotion level, and caring for others  PARTICIPATION LIMITATIONS: meal prep, cleaning, laundry, shopping, community activity, and yard work  PERSONAL FACTORS Age, Time since onset of injury/illness/exacerbation, and 3+ comorbidities:  Hx L TKA, HTN, osteoporosis, HLD, hx TB  are also affecting patient's functional outcome.   REHAB POTENTIAL: Good  CLINICAL DECISION MAKING: Stable/uncomplicated  EVALUATION COMPLEXITY: Low   GOALS: Goals reviewed with patient? Yes  SHORT TERM GOALS: Target date: 12/27/2021  Patient will be independent with HEP in order to improve functional outcomes. Baseline:  Goal status: INITIAL  2.  Patient will report at least 25% improvement in symptoms for improved quality of life. Baseline:  Goal status: INITIAL   LONG TERM GOALS: Target date: 01/17/2022  Patient will report at least 75% improvement in symptoms for improved quality of life. Baseline:  Goal status: INITIAL  2.  Patient will improve FOTO score to expected outcomes in order to indicate improved tolerance to activity. Baseline: complete next session Goal status: INITIAL  3.  Patient will demonstrate at least 25% improvement in lumbar ROM in all restricted planes for improved ability to move trunk while completing chores. Baseline: see AROM Goal status: INITIAL  4.  Patient will be able to ambulate at least 325 feet in 2MWT in order to demonstrate improved tolerance to activity. Baseline: 265 feet Goal status: INITIAL  5.  Patient will demonstrate grade of 4+/5 MMT grade in all tested musculature as evidence of improved strength to assist with stair ambulation and gait.   Baseline: see MMT Goal status: INITIAL    PLAN: PT FREQUENCY: 2x/week  PT DURATION: 6 weeks  PLANNED INTERVENTIONS: Therapeutic exercises, Therapeutic activity, Neuromuscular re-education, Balance training, Gait training, Patient/Family education, Joint manipulation, Joint mobilization, Stair training, Orthotic/Fit training, DME instructions, Aquatic Therapy, Dry Needling, Electrical stimulation, Spinal manipulation, Spinal mobilization, Cryotherapy, Moist heat, Compression bandaging, scar mobilization, Splintting, Taping, Traction,  Ultrasound, Ionotophoresis 4mg /ml Dexamethasone, and Manual therapy  PLAN FOR NEXT SESSION: Complete FOTO; continue to trial extension bias as patient stating improvement with most extension. Core  and glute strength, lumbar mobility, possibly trial side glides for lateral shift   Reola Mosher Boneta Standre, PT 12/06/2021, 9:09 AM

## 2021-12-15 ENCOUNTER — Ambulatory Visit (HOSPITAL_COMMUNITY): Payer: Medicare Other | Admitting: Physical Therapy

## 2021-12-15 DIAGNOSIS — R2689 Other abnormalities of gait and mobility: Secondary | ICD-10-CM

## 2021-12-15 DIAGNOSIS — M5459 Other low back pain: Secondary | ICD-10-CM

## 2021-12-15 DIAGNOSIS — M6281 Muscle weakness (generalized): Secondary | ICD-10-CM

## 2021-12-15 DIAGNOSIS — M5417 Radiculopathy, lumbosacral region: Secondary | ICD-10-CM | POA: Diagnosis not present

## 2021-12-15 DIAGNOSIS — M5441 Lumbago with sciatica, right side: Secondary | ICD-10-CM | POA: Diagnosis not present

## 2021-12-15 DIAGNOSIS — R29898 Other symptoms and signs involving the musculoskeletal system: Secondary | ICD-10-CM | POA: Diagnosis not present

## 2021-12-15 NOTE — Therapy (Signed)
OUTPATIENT PHYSICAL THERAPY THORACOLUMBAR treatment.     Patient Name: Cassidy Bennett MRN: [id number] DOB:[birth year], 81 y.o., female Today's Date: 12/15/2021   PT End of Session - 12/15/21 1426     Visit Number 2    Number of Visits 12    Date for PT Re-Evaluation 01/17/22    Authorization Type Primary Medicare Secondary BCBS    Progress Note Due on Visit 10    PT Start Time 1346    PT Stop Time 1425    PT Time Calculation (min) 39 min    Activity Tolerance Patient tolerated treatment well    Behavior During Therapy WFL for tasks assessed/performed              Past Medical History:  Diagnosis Date   Allergy    Complication of anesthesia    History of TB (tuberculosis)    treated 60 plus years ago last cxr 03-15-18  CXR every 5 years   Hypercholesteremia    denies at preop   Hypertension    Osteoarthritis    Osteoporosis    PONV (postoperative nausea and vomiting)    Post-menopausal    Tuberculosis    Past Surgical History:  Procedure Laterality Date   CATARACT EXTRACTION  2018   right eye   COLONOSCOPY  2015   DILATION AND CURETTAGE OF UTERUS  2000   KNEE ARTHROSCOPY  2014   left   LUMBAR DISC SURGERY     SHOULDER SURGERY     right   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   Procedure: TOTAL KNEE ARTHROPLASTY;  Surgeon: Gaynelle Arabian, MD;  Location: [facility];  Service: Orthopedics;  Laterality: Left;   Patient Active Problem List   Diagnosis Date Noted   Osteoarthritis of left knee 08/12/2018   Closed displaced fracture of proximal phalanx of right great toe 01/03/2017   Rotator cuff syndrome of right shoulder 01/03/2017   Hyperlipidemia 09/05/2016   Essential hypertension 08/27/2015   Urine abnormality 03/29/2015   Routine general medical examination at a health care facility 06/26/2013   History of TB (tuberculosis) 06/17/2012   Seasonal allergies 01/22/2008   Osteoporosis 12/13/2006   OA (osteoarthritis) of knee 10/11/2006    PCP:  Inda Coke PA  REFERRING PROVIDER: Alda Berthold, DO   REFERRING DIAG: [phone] (ICD-10-CM) - Lumbosacral radiculopathy M54.41,G89.29 (ICD-10-CM) - Chronic right-sided low back pain with right-sided sciatica   Rationale for Evaluation and Treatment Rehabilitation  THERAPY DIAG:  Other low back pain  Muscle weakness (generalized)  Other abnormalities of gait and mobility  Other symptoms and signs involving the musculoskeletal system  ONSET DATE: January 2023  SUBJECTIVE:  SUBJECTIVE STATEMENT: Pt states that she has been doing the exercises and she does feel better.  The pain is still preventing her from sleeping. Marland Kitchen  PERTINENT HISTORY:  Hx L TKA, HTN, osteoporosis, HLD, hx TB  PAIN:  Are you having pain? Yes: NPRS scale: 6/10 Pain location: low back Pain description: sore Aggravating factors: sleeping, sitting Relieving factors: meds, movement  PATIENT GOALS feel better   OBJECTIVE:   DIAGNOSTIC FINDINGS:  XR 08/13/20 IMPRESSION: 1. Prominent spondylosis and facet hypertrophy at the lumbosacral junction. No acute bony abnormality.  PATIENT SURVEYS:  Foto :  40     POSTURE: rounded shoulders, forward head, decreased lumbar lordosis, and decreased thoracic kyphosis  PALPATION: Grossly hypomobile thoracic and lumbar spine, tender throughout thoracic/lumbar CPA; TTP lower lumbar paraspinals and L glute max  LUMBAR ROM:   Active  A/PROM  eval  Flexion 0% limited *  Extension 75% limited - felt good  Right lateral flexion 75% limited *  Left lateral flexion 75% limited *  Right rotation 50% limited *  Left rotation 50% limited *   (Blank rows = not tested) *= pain   LOWER EXTREMITY MMT:    MMT Right eval Left eval  Hip flexion 4- 4-  Hip extension 3+ 3  Hip abduction  4- 3+  Hip adduction    Hip internal rotation    Hip external rotation    Knee flexion 4 4  Knee extension 4 4+  Ankle dorsiflexion 4- 4+  Ankle plantarflexion    Ankle inversion    Ankle eversion     (Blank rows = not tested)   FUNCTIONAL TESTS:  5 times sit to stand: 12.30 seconds without UE support 2 minute walk test: 265 feet  GAIT: Distance walked: 265 feet Assistive device utilized: None Level of assistance: Complete Independence Comments: , slight trunk flexion and R shift, slower cadence with gradually increasing LBP and LLE symptoms    TODAY'S TREATMENT              12/15/21              Wall arch              Sitting:               Sit as tall as possible x 5               Scapular retraction x 10               Prone:              POE x 1 minute x 2              Glut and abdomina set combine x 10              Heel squeeze x 10              Supine:              Knee to chest 3 x 30"                Hamstring stretch 3 x 30"               Bridge x10              Clam x 10              Bent knee raise x 10  Side lying:              Hip abduction x 10  12/06/21 POE 3 x 20 second holds Bridge 2x 10  Clam 2 x 10  Standing lumbar extension 2 x 10    PATIENT EDUCATION:  Education details: Patient educated on exam findings, POC, scope of PT, HEP, and continuing to exercise. Person educated: Patient Education method: Explanation, Demonstration, and Handouts Education comprehension: verbalized understanding, returned demonstration, verbal cues required, and tactile cues required   HOME EXERCISE PROGRAM: Access Code: [email or web address] 12/15/21 - Prone Gluteal Sets  - 2 x daily - 7 x weekly - 1 sets - 10 reps - Prone Heel Squeeze  - 2 x daily - 7 x weekly - 1 sets - 10 reps - 5" hold - Supine Single Knee to Chest Stretch  - 2 x daily - 7 x weekly - 1 sets - 10 reps - 20" hold - Supine Hamstring Stretch  - 2 x daily - 7 x weekly - 1 sets - 10 reps -  20" hold Date: 12/06/2021 - Supine Bridge  - 2-3 x daily - 7 x weekly - 2-3 sets - 10 reps - Clamshell  - 2-3 x daily - 7 x weekly - 2-3 sets - 10 reps - Prone Press Up On Elbows  - 2-3 x daily - 7 x weekly - 5 reps - 20 second hold - Standing Lumbar Extension  - 2-3 x daily - 7 x weekly - 2 sets - 10 reps  ASSESSMENT:  CLINICAL IMPRESSION: Pt continues with radicular radiculopathy, although she feels the exercises have improved her condition.  Therapist progressed exercises with both strengthening and stretching exercises. Patient will continue to benefit from skilled physical therapy in order to improve function and reduce impairment.   OBJECTIVE IMPAIRMENTS Abnormal gait, decreased activity tolerance, decreased balance, decreased endurance, decreased mobility, difficulty walking, decreased ROM, decreased strength, increased muscle spasms, impaired flexibility, improper body mechanics, postural dysfunction, and pain.   ACTIVITY LIMITATIONS carrying, lifting, bending, standing, squatting, stairs, transfers, locomotion level, and caring for others  PARTICIPATION LIMITATIONS: meal prep, cleaning, laundry, shopping, community activity, and yard work  PERSONAL FACTORS Age, Time since onset of injury/illness/exacerbation, and 3+ comorbidities: Hx L TKA, HTN, osteoporosis, HLD, hx TB  are also affecting patient's functional outcome.   REHAB POTENTIAL: Good  CLINICAL DECISION MAKING: Stable/uncomplicated  EVALUATION COMPLEXITY: Low   GOALS: Goals reviewed with patient? Yes  SHORT TERM GOALS: Target date: 12/27/2021  Patient will be independent with HEP in order to improve functional outcomes. Baseline:  Goal status: IN PROGRESS  2.  Patient will report at least 25% improvement in symptoms for improved quality of life. Baseline:  Goal status: IN PROGRESS   LONG TERM GOALS: Target date: 01/17/2022  Patient will report at least 75% improvement in symptoms for improved quality of  life. Baseline:  Goal status: IN PROGRESS  2.  Patient will improve FOTO score to expected outcomes in order to indicate improved tolerance to activity. Baseline: complete next session Goal status: IN PROGRESS  3.  Patient will demonstrate at least 25% improvement in lumbar ROM in all restricted planes for improved ability to move trunk while completing chores. Baseline: see AROM Goal status: IN PROGRESS  4.  Patient will be able to ambulate at least 325 feet in in order to demonstrate improved tolerance to activity. Baseline: 265 feet Goal status: IN PROGRESS  5.  Patient will demonstrate grade of 4+/5 MMT grade  in all tested musculature as evidence of improved strength to assist with stair ambulation and gait.   Baseline: see MMT Goal status: IN PROGRESS    PLAN: PT FREQUENCY: 2x/week  PT DURATION: 6 weeks  PLANNED INTERVENTIONS: Therapeutic exercises, Therapeutic activity, Neuromuscular re-education, Balance training, Gait training, Patient/Family education, Joint manipulation, Joint mobilization, Stair training, Orthotic/Fit training, DME instructions, Aquatic Therapy, Dry Needling, Electrical stimulation, Spinal manipulation, Spinal mobilization, Cryotherapy, Moist heat, Compression bandaging, scar mobilization, Splintting, Taping, Traction, Ultrasound, Ionotophoresis 4mg /ml Dexamethasone, and Manual therapy  PLAN FOR NEXT SESSION: Complete FOTO; continue to trial extension bias as patient stating improvement with most extension. Core and glute strength, lumbar mobility, possibly trial side glides for lateral shift   , PT CLT [phone]  [phone]

## 2021-12-19 ENCOUNTER — Ambulatory Visit (HOSPITAL_COMMUNITY): Payer: Medicare Other | Admitting: Physical Therapy

## 2021-12-19 DIAGNOSIS — R29898 Other symptoms and signs involving the musculoskeletal system: Secondary | ICD-10-CM | POA: Diagnosis not present

## 2021-12-19 DIAGNOSIS — M6281 Muscle weakness (generalized): Secondary | ICD-10-CM | POA: Diagnosis not present

## 2021-12-19 DIAGNOSIS — M5459 Other low back pain: Secondary | ICD-10-CM

## 2021-12-19 DIAGNOSIS — R2689 Other abnormalities of gait and mobility: Secondary | ICD-10-CM | POA: Diagnosis not present

## 2021-12-19 DIAGNOSIS — M5441 Lumbago with sciatica, right side: Secondary | ICD-10-CM | POA: Diagnosis not present

## 2021-12-19 DIAGNOSIS — M5417 Radiculopathy, lumbosacral region: Secondary | ICD-10-CM | POA: Diagnosis not present

## 2021-12-19 NOTE — Therapy (Signed)
OUTPATIENT PHYSICAL THERAPY THORACOLUMBAR treatment.     Patient Name: Cassidy Bennett MRN: [id number] DOB:1940-10-07, 81 y.o., female Today's Date: 12/19/2021   PT End of Session - 12/19/21 1430       Visit Number 3    Number of Visits 12    Date for PT Re-Evaluation 01/17/22    Authorization Type Primary Medicare Secondary BCBS    Progress Note Due on Visit 10    PT Start Time 1350    PT Stop Time 1430    PT Time Calculation (min) 40 min    Activity Tolerance Patient tolerated treatment well    Behavior During Therapy WFL for tasks assessed/performed              Past Medical History:  Diagnosis Date   Allergy    Complication of anesthesia    History of TB (tuberculosis)    treated 45 plus years ago last cxr 03-15-18  CXR every 5 years   Hypercholesteremia    denies at preop   Hypertension    Osteoarthritis    Osteoporosis    PONV (postoperative nausea and vomiting)    Post-menopausal    Tuberculosis    Past Surgical History:  Procedure Laterality Date   CATARACT EXTRACTION  2018   right eye   COLONOSCOPY  2015   DILATION AND CURETTAGE OF UTERUS  2000   KNEE ARTHROSCOPY  2014   left   LUMBAR DISC SURGERY     SHOULDER SURGERY     right   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   Procedure: TOTAL KNEE ARTHROPLASTY;  Surgeon: Ollen Gross, MD;  Location: [facility];  Service: Orthopedics;  Laterality: Left;   Patient Active Problem List   Diagnosis Date Noted   Osteoarthritis of left knee 08/12/2018   Closed displaced fracture of proximal phalanx of right great toe 01/03/2017   Rotator cuff syndrome of right shoulder 01/03/2017   Hyperlipidemia 09/05/2016   Essential hypertension 08/27/2015   Urine abnormality 03/29/2015   Routine general medical examination at a health care facility 06/26/2013   History of TB (tuberculosis) 06/17/2012   Seasonal allergies 01/22/2008   Osteoporosis 12/13/2006   OA (osteoarthritis) of knee 10/11/2006    PCP:  Jarold Motto PA  REFERRING PROVIDER: Glendale Chard, DO   REFERRING DIAG: [phone] (ICD-10-CM) - Lumbosacral radiculopathy M54.41,G89.29 (ICD-10-CM) - Chronic right-sided low back pain with right-sided sciatica   Rationale for Evaluation and Treatment Rehabilitation  THERAPY DIAG:  Other low back pain  Muscle weakness (generalized)  Other abnormalities of gait and mobility  Other symptoms and signs involving the musculoskeletal system  ONSET DATE: January 2023  SUBJECTIVE:  SUBJECTIVE STATEMENT: Pt states that she has been doing the exercises.  She woke up last night three times with radicular sx and had difficulty going back to sleep.  Her pain is only when she is doing a heavy activity and at night.  PERTINENT HISTORY:  Hx L TKA, HTN, osteoporosis, HLD, hx TB  PAIN:  Are you having pain? Yes: NPRS scale: 0/10 Pain location: low back Pain description: sore Aggravating factors: sleeping, sitting Relieving factors: meds, movement  PATIENT GOALS feel better   OBJECTIVE:   DIAGNOSTIC FINDINGS:  XR 08/13/20 IMPRESSION: 1. Prominent spondylosis and facet hypertrophy at the lumbosacral junction. No acute bony abnormality.  PATIENT SURVEYS:  Foto :  40     POSTURE: rounded shoulders, forward head, decreased lumbar lordosis, and decreased thoracic kyphosis  PALPATION: Grossly hypomobile thoracic and lumbar spine, tender throughout thoracic/lumbar CPA; TTP lower lumbar paraspinals and L glute max  LUMBAR ROM:   Active  A/PROM  eval  Flexion 0% limited *  Extension 75% limited - felt good  Right lateral flexion 75% limited *  Left lateral flexion 75% limited *  Right rotation 50% limited *  Left rotation 50% limited *   (Blank rows = not tested) *= pain   LOWER EXTREMITY MMT:     MMT Right eval Left eval  Hip flexion 4- 4-  Hip extension 3+ 3  Hip abduction 4- 3+  Hip adduction    Hip internal rotation    Hip external rotation    Knee flexion 4 4  Knee extension 4 4+  Ankle dorsiflexion 4- 4+  Ankle plantarflexion    Ankle inversion    Ankle eversion     (Blank rows = not tested)   FUNCTIONAL TESTS:  5 times sit to stand: 12.30 seconds without UE support 2 minute walk test: 265 feet  GAIT: Distance walked: 265 feet Assistive device utilized: None Level of assistance: Complete Independence Comments: 2MWT, slight trunk flexion and R shift, slower cadence with gradually increasing LBP and LLE symptoms    TODAY'S TREATMENT               12/19/21             Standing:             Wall arch x 10             Theraband postural exercises with green therband             Scapular retraction x 10             Rows x 10             Shoulder extension x 10             Standing extension x 10              Supine:             Decompression exercises with theraband             The overhead x 10             Side pull x 10             ER x 10              Sash x 10                          Knee to chest  x 3 B            Piriformis stretch x 3 B            Bridge x 10             Prone:            POE            Press up x 5 2 sets             Single leg raise x 10                                      12/15/21              Wall arch              Sitting:               Sit as tall as possible x 5               Scapular retraction x 10               Prone:              POE x 1 minute x 2              Glut and abdomina set combine x 10              Heel squeeze x 10              Supine:              Knee to chest 3 x 30"                Hamstring stretch 3 x 30"               Bridge x10              Clam x 10              Bent knee raise x 10               Side lying:              Hip abduction x 10  12/06/21 POE 3 x 20 second  holds Bridge 2x 10  Clam 2 x 10  Standing lumbar extension 2 x 10    PATIENT EDUCATION:  Education details: Patient educated on exam findings, POC, scope of PT, HEP, and continuing to exercise. Person educated: Patient Education method: Explanation, Demonstration, and Handouts Education comprehension: verbalized understanding, returned demonstration, verbal cues required, and tactile cues required   HOME EXERCISE PROGRAM: Access Code: [email or web address] 12/15/21 - Prone Gluteal Sets  - 2 x daily - 7 x weekly - 1 sets - 10 reps - Prone Heel Squeeze  - 2 x daily - 7 x weekly - 1 sets - 10 reps - 5" hold - Supine Single Knee to Chest Stretch  - 2 x daily - 7 x weekly - 1 sets - 10 reps - 20" hold - Supine Hamstring Stretch  - 2 x daily - 7 x weekly - 1 sets - 10 reps - 20" hold Date: 12/06/2021 - Supine Bridge  - 2-3 x daily - 7 x weekly - 2-3 sets - 10 reps - Clamshell  - 2-3 x daily - 7 x  weekly - 2-3 sets - 10 reps - Prone Press Up On Elbows  - 2-3 x daily - 7 x weekly - 5 reps - 20 second hold - Standing Lumbar Extension  - 2-3 x daily - 7 x weekly - 2 sets - 10 reps  ASSESSMENT:  CLINICAL IMPRESSION: Pt continues with radicular radiculopathy  Therapist progressed exercises and HEP.  PT continues to have postural dysfunction and core weakness.   Patient will continue to benefit from skilled physical therapy in order to improve function and reduce impairment.   OBJECTIVE IMPAIRMENTS Abnormal gait, decreased activity tolerance, decreased balance, decreased endurance, decreased mobility, difficulty walking, decreased ROM, decreased strength, increased muscle spasms, impaired flexibility, improper body mechanics, postural dysfunction, and pain.   ACTIVITY LIMITATIONS carrying, lifting, bending, standing, squatting, stairs, transfers, locomotion level, and caring for others  PARTICIPATION LIMITATIONS: meal prep, cleaning, laundry, shopping, community activity, and yard work  PERSONAL FACTORS  Age, Time since onset of injury/illness/exacerbation, and 3+ comorbidities: Hx L TKA, HTN, osteoporosis, HLD, hx TB  are also affecting patient's functional outcome.   REHAB POTENTIAL: Good  CLINICAL DECISION MAKING: Stable/uncomplicated  EVALUATION COMPLEXITY: Low   GOALS: Goals reviewed with patient? Yes  SHORT TERM GOALS: Target date: 12/27/2021  Patient will be independent with HEP in order to improve functional outcomes. Baseline:  Goal status: IN PROGRESS  2.  Patient will report at least 25% improvement in symptoms for improved quality of life. Baseline:  Goal status: IN PROGRESS   LONG TERM GOALS: Target date: 01/17/2022  Patient will report at least 75% improvement in symptoms for improved quality of life. Baseline:  Goal status: IN PROGRESS  2.  Patient will improve FOTO score to expected outcomes in order to indicate improved tolerance to activity. Baseline: complete next session Goal status: IN PROGRESS  3.  Patient will demonstrate at least 25% improvement in lumbar ROM in all restricted planes for improved ability to move trunk while completing chores. Baseline: see AROM Goal status: IN PROGRESS  4.  Patient will be able to ambulate at least 325 feet in in order to demonstrate improved tolerance to activity. Baseline: 265 feet Goal status: IN PROGRESS  5.  Patient will demonstrate grade of 4+/5 MMT grade in all tested musculature as evidence of improved strength to assist with stair ambulation and gait.   Baseline: see MMT Goal status: IN PROGRESS    PLAN: PT FREQUENCY: 2x/week  PT DURATION: 6 weeks  PLANNED INTERVENTIONS: Therapeutic exercises, Therapeutic activity, Neuromuscular re-education, Balance training, Gait training, Patient/Family education, Joint manipulation, Joint mobilization, Stair training, Orthotic/Fit training, DME instructions, Aquatic Therapy, Dry Needling, Electrical stimulation, Spinal manipulation, Spinal mobilization,  Cryotherapy, Moist heat, Compression bandaging, scar mobilization, Splintting, Taping, Traction, Ultrasound, Ionotophoresis 4mg /ml Dexamethasone, and Manual therapy  PLAN FOR NEXT SESSION:  continue to extension bias as patient stating improvement with most extension. Core and glute strength, lumbar mobility, possibly trial side glides for lateral shift   , PT CLT [phone]  1430

## 2021-12-21 ENCOUNTER — Ambulatory Visit (HOSPITAL_COMMUNITY): Payer: Medicare Other | Admitting: Physical Therapy

## 2021-12-21 DIAGNOSIS — M6281 Muscle weakness (generalized): Secondary | ICD-10-CM | POA: Diagnosis not present

## 2021-12-21 DIAGNOSIS — M5417 Radiculopathy, lumbosacral region: Secondary | ICD-10-CM | POA: Diagnosis not present

## 2021-12-21 DIAGNOSIS — M5459 Other low back pain: Secondary | ICD-10-CM

## 2021-12-21 DIAGNOSIS — R2689 Other abnormalities of gait and mobility: Secondary | ICD-10-CM

## 2021-12-21 DIAGNOSIS — M5441 Lumbago with sciatica, right side: Secondary | ICD-10-CM | POA: Diagnosis not present

## 2021-12-21 DIAGNOSIS — R29898 Other symptoms and signs involving the musculoskeletal system: Secondary | ICD-10-CM | POA: Diagnosis not present

## 2021-12-21 NOTE — Therapy (Signed)
OUTPATIENT PHYSICAL THERAPY THORACOLUMBAR treatment.     Patient Name: Cassidy Bennett MRN: [id number] DOB:1940/04/29, 81 y.o., female Today's Date: 12/21/2021   PT End of Session - 12/19/21 1430       Visit Number 3    Number of Visits 12    Date for PT Re-Evaluation 01/17/22    Authorization Type Primary Medicare Secondary BCBS    Progress Note Due on Visit 10    PT Start Time 1350    PT Stop Time 1430    PT Time Calculation (min) 40 min    Activity Tolerance Patient tolerated treatment well    Behavior During Therapy WFL for tasks assessed/performed              Past Medical History:  Diagnosis Date   Allergy    Complication of anesthesia    History of TB (tuberculosis)    treated 45 plus years ago last cxr 03-15-18  CXR every 5 years   Hypercholesteremia    denies at preop   Hypertension    Osteoarthritis    Osteoporosis    PONV (postoperative nausea and vomiting)    Post-menopausal    Tuberculosis    Past Surgical History:  Procedure Laterality Date   CATARACT EXTRACTION  2018   right eye   COLONOSCOPY  2015   DILATION AND CURETTAGE OF UTERUS  2000   KNEE ARTHROSCOPY  2014   left   LUMBAR DISC SURGERY     SHOULDER SURGERY     right   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   Procedure: TOTAL KNEE ARTHROPLASTY;  Surgeon: Ollen GrossAluisio, Frank, MD;  Location: [facility];  Service: Orthopedics;  Laterality: Left;   Patient Active Problem List   Diagnosis Date Noted   Osteoarthritis of left knee 08/12/2018   Closed displaced fracture of proximal phalanx of right great toe 01/03/2017   Rotator cuff syndrome of right shoulder 01/03/2017   Hyperlipidemia 09/05/2016   Essential hypertension 08/27/2015   Urine abnormality 03/29/2015   Routine general medical examination at a health care facility 06/26/2013   History of TB (tuberculosis) 06/17/2012   Seasonal allergies 01/22/2008   Osteoporosis 12/13/2006   OA (osteoarthritis) of knee 10/11/2006    PCP:  Jarold MottoSamantha Worley PA  REFERRING PROVIDER: Glendale ChardPatel, Donika K, DO   REFERRING DIAG: M54.17 (ICD-10-CM) - Lumbosacral radiculopathy M54.41,G89.29 (ICD-10-CM) - Chronic right-sided low back pain with right-sided sciatica   Rationale for Evaluation and Treatment Rehabilitation  THERAPY DIAG:  Other low back pain  Muscle weakness (generalized)  Other abnormalities of gait and mobility  ONSET DATE: January 2023  SUBJECTIVE:  SUBJECTIVE STATEMENT: Prone exercise is hard to do on her bed. She is doing HEP. She feels a little better but has pain in her LT leg at night. Turning to the right makes it better.   PERTINENT HISTORY:  Hx L TKA, HTN, osteoporosis, HLD, hx TB  PAIN:  Are you having pain? Yes: NPRS scale: 0/10 Pain location: low back Pain description: sore Aggravating factors: sleeping, sitting Relieving factors: meds, movement  PATIENT GOALS feel better   OBJECTIVE:   DIAGNOSTIC FINDINGS:  XR 08/13/20 IMPRESSION: 1. Prominent spondylosis and facet hypertrophy at the lumbosacral junction. No acute bony abnormality.  PATIENT SURVEYS:  Foto :  40     POSTURE: rounded shoulders, forward head, decreased lumbar lordosis, and decreased thoracic kyphosis  PALPATION: Grossly hypomobile thoracic and lumbar spine, tender throughout thoracic/lumbar CPA; TTP lower lumbar paraspinals and L glute max  LUMBAR ROM:   Active  A/PROM  eval  Flexion 0% limited *  Extension 75% limited - felt good  Right lateral flexion 75% limited *  Left lateral flexion 75% limited *  Right rotation 50% limited *  Left rotation 50% limited *   (Blank rows = not tested) *= pain   LOWER EXTREMITY MMT:    MMT Right eval Left eval  Hip flexion 4- 4-  Hip extension 3+ 3  Hip abduction 4- 3+  Hip adduction     Hip internal rotation    Hip external rotation    Knee flexion 4 4  Knee extension 4 4+  Ankle dorsiflexion 4- 4+  Ankle plantarflexion    Ankle inversion    Ankle eversion     (Blank rows = not tested)   FUNCTIONAL TESTS:  5 times sit to stand: 12.30 seconds without UE support 2 minute walk test: 265 feet  GAIT: Distance walked: 265 feet Assistive device utilized: None Level of assistance: Complete Independence Comments: 2MWT, slight trunk flexion and R shift, slower cadence with gradually increasing LBP and LLE symptoms    TODAY'S TREATMENT               12/21/21 POE 10 x 5" Piriformis stretch 3 x 30" each  Bridge 10 x 5"  Ab march x20 SLR with ab march 2 x 10 each  Band rows YTB 2 x 10 Shoulder extension YTB 2 x 10 Shoulder ER YTB at wall 2 x 10 Standing lumbar extension x10   Manual STM to bilateral lumbar paraspinals  Manual PA lumbar mobs grade I-II   12/19/21             Standing:             Wall arch x 10             Theraband postural exercises with green therband             Scapular retraction x 10             Rows x 10             Shoulder extension x 10             Standing extension x 10              Supine:             Decompression exercises with theraband             The overhead x 10  Side pull x 10             ER x 10              Sash x 10                          Knee to chest x 3 B            Piriformis stretch x 3 B            Bridge x 10              Prone:            POE            Press up x 5 2 sets             Single leg raise x 10                                      12/15/21              Wall arch              Sitting:               Sit as tall as possible x 5               Scapular retraction x 10               Prone:              POE x 1 minute x 2              Glut and abdomina set combine x 10              Heel squeeze x 10              Supine:              Knee to chest 3 x 30"                 Hamstring stretch 3 x 30"               Bridge x10              Clam x 10              Bent knee raise x 10               Side lying:              Hip abduction x 10   PATIENT EDUCATION:  Education details: Patient educated on exam findings, POC, scope of PT, HEP, and continuing to exercise. Person educated: Patient Education method: Explanation, Demonstration, and Handouts Education comprehension: verbalized understanding, returned demonstration, verbal cues required, and tactile cues required   HOME EXERCISE PROGRAM: Access Code: [email or web address] 12/21/21 - Supine Transversus Abdominis Bracing - Hands on Stomach  - 2 x daily - 7 x weekly - 1 sets - 10 reps - 5 second hold - Supine March  - 2 x daily - 7 x weekly - 2 sets - 10 reps - Small Range Straight Leg Raise  - 2 x daily - 7 x weekly - 2 sets - 10 reps  12/15/21 - Prone Gluteal Sets  - 2 x daily -  7 x weekly - 1 sets - 10 reps - Prone Heel Squeeze  - 2 x daily - 7 x weekly - 1 sets - 10 reps - 5" hold - Supine Single Knee to Chest Stretch  - 2 x daily - 7 x weekly - 1 sets - 10 reps - 20" hold - Supine Hamstring Stretch  - 2 x daily - 7 x weekly - 1 sets - 10 reps - 20" hold Date: 12/06/2021 - Supine Bridge  - 2-3 x daily - 7 x weekly - 2-3 sets - 10 reps - Clamshell  - 2-3 x daily - 7 x weekly - 2-3 sets - 10 reps - Prone Press Up On Elbows  - 2-3 x daily - 7 x weekly - 5 reps - 20 second hold - Standing Lumbar Extension  - 2-3 x daily - 7 x weekly - 2 sets - 10 reps  ASSESSMENT:  CLINICAL IMPRESSION: Patient tolerated session well. Progressed core strengthening with added ab march and leg raises with ab bracing. Patient educated on purpose and function of all added exercise. Performed manual joint mobs and STM to lumbar paraspinals with good results. Patient notes less stiffness following. Patient will continue to benefit from skilled therapy services to reduce remaining deficits and improve functional ability.    OBJECTIVE  IMPAIRMENTS Abnormal gait, decreased activity tolerance, decreased balance, decreased endurance, decreased mobility, difficulty walking, decreased ROM, decreased strength, increased muscle spasms, impaired flexibility, improper body mechanics, postural dysfunction, and pain.   ACTIVITY LIMITATIONS carrying, lifting, bending, standing, squatting, stairs, transfers, locomotion level, and caring for others  PARTICIPATION LIMITATIONS: meal prep, cleaning, laundry, shopping, community activity, and yard work  PERSONAL FACTORS Age, Time since onset of injury/illness/exacerbation, and 3+ comorbidities: Hx L TKA, HTN, osteoporosis, HLD, hx TB  are also affecting patient's functional outcome.   REHAB POTENTIAL: Good  CLINICAL DECISION MAKING: Stable/uncomplicated  EVALUATION COMPLEXITY: Low   GOALS: Goals reviewed with patient? Yes  SHORT TERM GOALS: Target date: 12/27/2021  Patient will be independent with HEP in order to improve functional outcomes. Baseline:  Goal status: IN PROGRESS  2.  Patient will report at least 25% improvement in symptoms for improved quality of life. Baseline:  Goal status: IN PROGRESS   LONG TERM GOALS: Target date: 01/17/2022  Patient will report at least 75% improvement in symptoms for improved quality of life. Baseline:  Goal status: IN PROGRESS  2.  Patient will improve FOTO score to expected outcomes in order to indicate improved tolerance to activity. Baseline: complete next session Goal status: IN PROGRESS  3.  Patient will demonstrate at least 25% improvement in lumbar ROM in all restricted planes for improved ability to move trunk while completing chores. Baseline: see AROM Goal status: IN PROGRESS  4.  Patient will be able to ambulate at least 325 feet in in order to demonstrate improved tolerance to activity. Baseline: 265 feet Goal status: IN PROGRESS  5.  Patient will demonstrate grade of 4+/5 MMT grade in all tested musculature as  evidence of improved strength to assist with stair ambulation and gait.   Baseline: see MMT Goal status: IN PROGRESS    PLAN: PT FREQUENCY: 2x/week  PT DURATION: 6 weeks  PLANNED INTERVENTIONS: Therapeutic exercises, Therapeutic activity, Neuromuscular re-education, Balance training, Gait training, Patient/Family education, Joint manipulation, Joint mobilization, Stair training, Orthotic/Fit training, DME instructions, Aquatic Therapy, Dry Needling, Electrical stimulation, Spinal manipulation, Spinal mobilization, Cryotherapy, Moist heat, Compression bandaging, scar mobilization, Splintting, Taping, Traction, Ultrasound, Ionotophoresis  4mg /ml Dexamethasone, and Manual therapy  PLAN FOR NEXT SESSION:  continue to extension bias as patient stating improvement with most extension. Core and glute strength, lumbar mobility, possibly trial side glides for lateral shift   4:45 PM, 12/21/21 12/23/21 PT DPT  Physical Therapist with [facility]  [phone]

## 2021-12-27 ENCOUNTER — Ambulatory Visit (HOSPITAL_COMMUNITY): Payer: Medicare Other | Admitting: Physical Therapy

## 2021-12-27 DIAGNOSIS — M5459 Other low back pain: Secondary | ICD-10-CM

## 2021-12-27 DIAGNOSIS — R2689 Other abnormalities of gait and mobility: Secondary | ICD-10-CM | POA: Diagnosis not present

## 2021-12-27 DIAGNOSIS — M5417 Radiculopathy, lumbosacral region: Secondary | ICD-10-CM | POA: Diagnosis not present

## 2021-12-27 DIAGNOSIS — M6281 Muscle weakness (generalized): Secondary | ICD-10-CM | POA: Diagnosis not present

## 2021-12-27 DIAGNOSIS — R29898 Other symptoms and signs involving the musculoskeletal system: Secondary | ICD-10-CM | POA: Diagnosis not present

## 2021-12-27 DIAGNOSIS — M5441 Lumbago with sciatica, right side: Secondary | ICD-10-CM | POA: Diagnosis not present

## 2021-12-27 NOTE — Therapy (Addendum)
OUTPATIENT PHYSICAL THERAPY THORACOLUMBAR treatment.     Patient Name: Cassidy Bennett MRN: [id number] DOB:[birth year], 81 y.o., female Today's Date: 12/27/2021    PT End of Session - 12/27/21 1646     Visit Number 5    Number of Visits 12    Date for PT Re-Evaluation 01/17/22    Authorization Type Primary Medicare Secondary BCBS    Progress Note Due on Visit 10    PT Start Time 1606    PT Stop Time 1446    PT Time Calculation (min) 1360 min    Activity Tolerance Patient tolerated treatment well    Behavior During Therapy [facility] for tasks assessed/performed                    Past Medical History:  Diagnosis Date   Allergy    Complication of anesthesia    History of TB (tuberculosis)    treated 45 plus years ago last cxr 03-15-18  CXR every 5 years   Hypercholesteremia    denies at preop   Hypertension    Osteoarthritis    Osteoporosis    PONV (postoperative nausea and vomiting)    Post-menopausal    Tuberculosis    Past Surgical History:  Procedure Laterality Date   CATARACT EXTRACTION  2018   right eye   COLONOSCOPY  2015   DILATION AND CURETTAGE OF UTERUS  2000   KNEE ARTHROSCOPY  2014   left   LUMBAR DISC SURGERY     SHOULDER SURGERY     right   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   Procedure: TOTAL KNEE ARTHROPLASTY;  Surgeon: Ollen Gross, MD;  Location: [facility];  Service: Orthopedics;  Laterality: Left;   Patient Active Problem List   Diagnosis Date Noted   Osteoarthritis of left knee 08/12/2018   Closed displaced fracture of proximal phalanx of right great toe 01/03/2017   Rotator cuff syndrome of right shoulder 01/03/2017   Hyperlipidemia 09/05/2016   Essential hypertension 08/27/2015   Urine abnormality 03/29/2015   Routine general medical examination at a health care facility 06/26/2013   History of TB (tuberculosis) 06/17/2012   Seasonal allergies 01/22/2008   Osteoporosis 12/13/2006   OA (osteoarthritis) of knee 10/11/2006     PCP: Jarold Motto PA  REFERRING PROVIDER: Glendale Chard, DO   REFERRING DIAG: [phone] (ICD-10-CM) - Lumbosacral radiculopathy M54.41,G89.29 (ICD-10-CM) - Chronic right-sided low back pain with right-sided sciatica   Rationale for Evaluation and Treatment Rehabilitation  THERAPY DIAG:  Other low back pain  Muscle weakness (generalized)  Other abnormalities of gait and mobility  Other symptoms and signs involving the musculoskeletal system  ONSET DATE: January 2023  SUBJECTIVE:  SUBJECTIVE STATEMENT:  When therapist asked how she responded to manual performed last session pt states that she did not receive manual.  Pt states that her radicular sx are not going down her leg as far as they were.   PERTINENT HISTORY:  Hx L TKA, HTN, osteoporosis, HLD, hx TB  PAIN:  Are you having pain? Yes: NPRS scale: 0/10 Pain location: low back Pain description: sore Aggravating factors: sleeping, sitting Relieving factors: meds, movement  PATIENT GOALS feel better   OBJECTIVE:   DIAGNOSTIC FINDINGS:  XR 08/13/20 IMPRESSION: 1. Prominent spondylosis and facet hypertrophy at the lumbosacral junction. No acute bony abnormality.  PATIENT SURVEYS:  Foto :  40     POSTURE: rounded shoulders, forward head, decreased lumbar lordosis, and decreased thoracic kyphosis  PALPATION: Grossly hypomobile thoracic and lumbar spine, tender throughout thoracic/lumbar CPA; TTP lower lumbar paraspinals and L glute max  LUMBAR ROM:   Active  A/PROM  eval  Flexion 0% limited *  Extension 75% limited - felt good  Right lateral flexion 75% limited *  Left lateral flexion 75% limited *  Right rotation 50% limited *  Left rotation 50% limited *   (Blank rows = not tested) *= pain   LOWER EXTREMITY MMT:     MMT Right eval Left eval  Hip flexion 4- 4-  Hip extension 3+ 3  Hip abduction 4- 3+  Hip adduction    Hip internal rotation    Hip external rotation    Knee flexion 4 4  Knee extension 4 4+  Ankle dorsiflexion 4- 4+  Ankle plantarflexion    Ankle inversion    Ankle eversion     (Blank rows = not tested)   FUNCTIONAL TESTS:  5 times sit to stand: 12.30 seconds without UE support 2 minute walk test: 265 feet  GAIT: Distance walked: 265 feet Assistive device utilized: None Level of assistance: Complete Independence Comments: , slight trunk flexion and R shift, slower cadence with gradually increasing LBP and LLE symptoms    TODAY'S TREATMENT 12/27/2021 Postural theraband exercises: Green: Rows x10 Scapular retraction x 10 Shoulder extension x 10  Standing :  wall arch x 15 POE one minute x 2 reps  Hip extension x 10 B Y, I and T x 10  Supine  Bridge 5" x 15  Knee to chest B x 3 for 30"   Decompression exercises with theraband             The overhead x 5             Side pull x 5             ER x 5             Sash x 5 Manual completed to decrease mm tightness and pain.                12/21/21 POE 10 x 5" Piriformis stretch 3 x 30" each  Bridge 10 x 5"  Ab march x20 SLR with ab march 2 x 10 each  Band rows YTB 2 x 10 Shoulder extension YTB 2 x 10 Shoulder ER YTB at wall 2 x 10 Standing lumbar extension x10   Manual STM to bilateral lumbar paraspinals  Manual PA lumbar mobs grade I-II   12/19/21             Standing:             Wall arch x 10  Theraband postural exercises with green therband             Scapular retraction x 10             Rows x 10             Shoulder extension x 10             Standing extension x 10              Supine:             Decompression exercises with theraband             The overhead x 10             Side pull x 10             ER x 10              Sash x 10                          Knee  to chest x 3 B            Piriformis stretch x 3 B            Bridge x 10              Prone:            POE            Press up x 5 2 sets             Single leg raise x 10                                      12/15/21              Wall arch              Sitting:               Sit as tall as possible x 5               Scapular retraction x 10               Prone:              POE x 1 minute x 2              Glut and abdomina set combine x 10              Heel squeeze x 10              Supine:              Knee to chest 3 x 30"                Hamstring stretch 3 x 30"               Bridge x10              Clam x 10              Bent knee raise x 10               Side lying:              Hip abduction x 10   PATIENT  EDUCATION:  Education details: Patient educated on exam findings, POC, scope of PT, HEP, and continuing to exercise. Person educated: Patient Education method: Explanation, Demonstration, and Handouts Education comprehension: verbalized understanding, returned demonstration, verbal cues required, and tactile cues required   HOME EXERCISE PROGRAM:              12/27/21              - Standing Row with Resistance with Anchored Resistance at Chest Height Palms Down  - 1 x daily - 7 x weekly - 1 sets - 10 reps - 3-5" hold - Standing Row with Anchored Resistance  - 1 x daily - 7 x weekly - 1 sets - 10 reps - 3-5"  hold - Single Arm Shoulder Extension with Anchored Resistance  - 1 x daily - 7 x weekly - 1 sets - 10 reps - 3-5"  hold  Access Code: [email or web address] 12/21/21 - Supine Transversus Abdominis Bracing - Hands on Stomach  - 2 x daily - 7 x weekly - 1 sets - 10 reps - 5 second hold - Supine March  - 2 x daily - 7 x weekly - 2 sets - 10 reps - Small Range Straight Leg Raise  - 2 x daily - 7 x weekly - 2 sets - 10 reps  12/15/21 - Prone Gluteal Sets  - 2 x daily - 7 x weekly - 1 sets - 10 reps - Prone Heel Squeeze  - 2 x daily - 7 x weekly - 1 sets - 10 reps - 5"  hold - Supine Single Knee to Chest Stretch  - 2 x daily - 7 x weekly - 1 sets - 10 reps - 20" hold - Supine Hamstring Stretch  - 2 x daily - 7 x weekly - 1 sets - 10 reps - 20" hold Date: 12/06/2021 - Supine Bridge  - 2-3 x daily - 7 x weekly - 2-3 sets - 10 reps - Clamshell  - 2-3 x daily - 7 x weekly - 2-3 sets - 10 reps - Prone Press Up On Elbows  - 2-3 x daily - 7 x weekly - 5 reps - 20 second hold - Standing Lumbar Extension  - 2-3 x daily - 7 x weekly - 2 sets - 10 reps  ASSESSMENT:  CLINICAL IMPRESSION: Pt given theraband and instructions for HEP.  Manual decreased mm tightness approximately 20%.  Pt had notable shaking of B UE with decompression theraband exercises.  Patient will continue to benefit from skilled therapy services to reduce remaining deficits and improve functional ability.    OBJECTIVE IMPAIRMENTS Abnormal gait, decreased activity tolerance, decreased balance, decreased endurance, decreased mobility, difficulty walking, decreased ROM, decreased strength, increased muscle spasms, impaired flexibility, improper body mechanics, postural dysfunction, and pain.   ACTIVITY LIMITATIONS carrying, lifting, bending, standing, squatting, stairs, transfers, locomotion level, and caring for others  PARTICIPATION LIMITATIONS: meal prep, cleaning, laundry, shopping, community activity, and yard work  PERSONAL FACTORS Age, Time since onset of injury/illness/exacerbation, and 3+ comorbidities: Hx L TKA, HTN, osteoporosis, HLD, hx TB  are also affecting patient's functional outcome.   REHAB POTENTIAL: Good  CLINICAL DECISION MAKING: Stable/uncomplicated  EVALUATION COMPLEXITY: Low   GOALS: Goals reviewed with patient? Yes  SHORT TERM GOALS: Target date: 12/27/2021  Patient will be independent with HEP in order to improve functional outcomes. Baseline:  Goal status: IN PROGRESS  2.  Patient will report at least 25% improvement in symptoms for improved quality of  life. Baseline:  Goal status: IN PROGRESS   LONG TERM GOALS: Target date: 01/17/2022  Patient will report at least 75% improvement in symptoms for improved quality of life. Baseline:  Goal status: IN PROGRESS  2.  Patient will improve FOTO score to expected outcomes in order to indicate improved tolerance to activity. Baseline: complete next session Goal status: IN PROGRESS  3.  Patient will demonstrate at least 25% improvement in lumbar ROM in all restricted planes for improved ability to move trunk while completing chores. Baseline: see AROM Goal status: IN PROGRESS  4.  Patient will be able to ambulate at least 325 feet in 2MWT in order to demonstrate improved tolerance to activity. Baseline: 265 feet Goal status: IN PROGRESS  5.  Patient will demonstrate grade of 4+/5 MMT grade in all tested musculature as evidence of improved strength to assist with stair ambulation and gait.   Baseline: see MMT Goal status: IN PROGRESS    PLAN: PT FREQUENCY: 2x/week  PT DURATION: 6 weeks  PLANNED INTERVENTIONS: Therapeutic exercises, Therapeutic activity, Neuromuscular re-education, Balance training, Gait training, Patient/Family education, Joint manipulation, Joint mobilization, Stair training, Orthotic/Fit training, DME instructions, Aquatic Therapy, Dry Needling, Electrical stimulation, Spinal manipulation, Spinal mobilization, Cryotherapy, Moist heat, Compression bandaging, scar mobilization, Splintting, Taping, Traction, Ultrasound, Ionotophoresis 4mg /ml Dexamethasone, and Manual therapy  PLAN FOR NEXT SESSION:  continue to extension bias as patient stating improvement with most extension. Core and glute strength, lumbar mobility, possibly trial side glides for lateral shift  Virgina OrganCynthia Taya Ashbaugh, PT CLT [phone]  [phone]

## 2021-12-29 ENCOUNTER — Ambulatory Visit (HOSPITAL_COMMUNITY): Payer: Medicare Other | Attending: Neurology | Admitting: Physical Therapy

## 2021-12-29 DIAGNOSIS — M6281 Muscle weakness (generalized): Secondary | ICD-10-CM | POA: Insufficient documentation

## 2021-12-29 DIAGNOSIS — M5459 Other low back pain: Secondary | ICD-10-CM | POA: Diagnosis not present

## 2021-12-29 DIAGNOSIS — R2689 Other abnormalities of gait and mobility: Secondary | ICD-10-CM | POA: Diagnosis not present

## 2021-12-29 DIAGNOSIS — R29898 Other symptoms and signs involving the musculoskeletal system: Secondary | ICD-10-CM | POA: Diagnosis not present

## 2021-12-29 NOTE — Therapy (Signed)
OUTPATIENT PHYSICAL THERAPY THORACOLUMBAR treatment.     Patient Name: Cassidy Bennett MRN: [id number] DOB:1940-05-21, 81 y.o., female Today's Date: 12/29/2021    PT End of Session - 12/29/21 1647     Visit Number 6    Number of Visits 12    Date for PT Re-Evaluation 01/17/22    Authorization Type Primary Medicare Secondary BCBS    Progress Note Due on Visit 10    PT Start Time 1602    PT Stop Time 1645    PT Time Calculation (min) 43 min    Activity Tolerance Patient tolerated treatment well    Behavior During Therapy [facility] for tasks assessed/performed                     Past Medical History:  Diagnosis Date   Allergy    Complication of anesthesia    History of TB (tuberculosis)    treated 20 plus years ago last cxr 03-15-18  CXR every 5 years   Hypercholesteremia    denies at preop   Hypertension    Osteoarthritis    Osteoporosis    PONV (postoperative nausea and vomiting)    Post-menopausal    Tuberculosis    Past Surgical History:  Procedure Laterality Date   CATARACT EXTRACTION  2018   right eye   COLONOSCOPY  2015   DILATION AND CURETTAGE OF UTERUS  2000   KNEE ARTHROSCOPY  2014   left   LUMBAR DISC SURGERY     SHOULDER SURGERY     right   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   Procedure: TOTAL KNEE ARTHROPLASTY;  Surgeon: Gaynelle Arabian, MD;  Location: [facility];  Service: Orthopedics;  Laterality: Left;   Patient Active Problem List   Diagnosis Date Noted   Osteoarthritis of left knee 08/12/2018   Closed displaced fracture of proximal phalanx of right great toe 01/03/2017   Rotator cuff syndrome of right shoulder 01/03/2017   Hyperlipidemia 09/05/2016   Essential hypertension 08/27/2015   Urine abnormality 03/29/2015   Routine general medical examination at a health care facility 06/26/2013   History of TB (tuberculosis) 06/17/2012   Seasonal allergies 01/22/2008   Osteoporosis 12/13/2006   OA (osteoarthritis) of knee 10/11/2006     PCP: Inda Coke PA  REFERRING PROVIDER: Alda Berthold, DO   REFERRING DIAG: [phone] (ICD-10-CM) - Lumbosacral radiculopathy M54.41,G89.29 (ICD-10-CM) - Chronic right-sided low back pain with right-sided sciatica   Rationale for Evaluation and Treatment Rehabilitation  THERAPY DIAG:  Muscle weakness (generalized)  Other low back pain  Other abnormalities of gait and mobility  Other symptoms and signs involving the musculoskeletal system  ONSET DATE: January 2023  SUBJECTIVE:  SUBJECTIVE STATEMENT: Pt states that she worked hard this morning and therefore had increased pain, about a 6/10 but does not have the pain at this time. Marland Kitchen.   PERTINENT HISTORY:  Hx L TKA, HTN, osteoporosis, HLD, hx TB  PAIN:  Are you having pain? Yes: NPRS scale: 0/10 Pain location: low back Pain description: sore Aggravating factors: sleeping, sitting Relieving factors: meds, movement  PATIENT GOALS feel better   OBJECTIVE:   DIAGNOSTIC FINDINGS:  XR 08/13/20 IMPRESSION: 1. Prominent spondylosis and facet hypertrophy at the lumbosacral junction. No acute bony abnormality.  PATIENT SURVEYS:  Foto :  40     POSTURE: rounded shoulders, forward head, decreased lumbar lordosis, and decreased thoracic kyphosis  PALPATION: Grossly hypomobile thoracic and lumbar spine, tender throughout thoracic/lumbar CPA; TTP lower lumbar paraspinals and L glute max  LUMBAR ROM:   Active  A/PROM  eval  Flexion 0% limited *  Extension 75% limited - felt good  Right lateral flexion 75% limited *  Left lateral flexion 75% limited *  Right rotation 50% limited *  Left rotation 50% limited *   (Blank rows = not tested) *= pain   LOWER EXTREMITY MMT:    MMT Right eval Left eval  Hip flexion 4- 4-  Hip extension  3+ 3  Hip abduction 4- 3+  Hip adduction    Hip internal rotation    Hip external rotation    Knee flexion 4 4  Knee extension 4 4+  Ankle dorsiflexion 4- 4+  Ankle plantarflexion    Ankle inversion    Ankle eversion     (Blank rows = not tested)   FUNCTIONAL TESTS:  5 times sit to stand: 12.30 seconds without UE support 2 minute walk test: 265 feet  GAIT: Distance walked: 265 feet Assistive device utilized: None Level of assistance: Complete Independence Comments: 2MWT, slight trunk flexion and R shift, slower cadence with gradually increasing LBP and LLE symptoms    TODAY'S TREATMENT  12/30/2021 Standing: Walking down the hall with fewer steps to increase stride length and concentrating on hitting with her heel first x 4 RT Postural theraband exercises: Green: Rows x15 Scapular retraction x 15 Shoulder extension x 15  Paloff x 10  wall arch x 10 Hip excursion  Prone:   POE one minute x 2 reps  Hip extension x 10 B Supine   Knee to chest B x 3 for 30"  Manual completed to thoracic and lumbar area to decrease tightness and pain 12/27/2021 Postural theraband exercises: Green: Rows x10 Scapular retraction x 10 Shoulder extension x 10  Standing :  wall arch x 15 POE one minute x 2 reps  Hip extension x 10 B Y, I and T x 10  Supine  Bridge 5" x 15  Knee to chest B x 3 for 30"  Manual completed to decrease mm tightness and pain.               12/21/21 POE 10 x 5" Piriformis stretch 3 x 30" each  Bridge 10 x 5"  Ab march x20 SLR with ab march 2 x 10 each  Band rows YTB 2 x 10 Shoulder extension YTB 2 x 10 Shoulder ER YTB at wall 2 x 10 Standing lumbar extension x10   Manual STM to bilateral lumbar paraspinals  Manual PA lumbar mobs grade I-II   12/19/21             Standing:  Wall arch x 10             Theraband postural exercises with green therband             Scapular retraction x 10             Rows x 10             Shoulder  extension x 10             Standing extension x 10              Supine:             Decompression exercises with theraband             The overhead x 10             Side pull x 10             ER x 10              Sash x 10                          Knee to chest x 3 B            Piriformis stretch x 3 B            Bridge x 10              Prone:            POE            Press up x 5 2 sets             Single leg raise x 10                                      12/15/21              Wall arch              Sitting:               Sit as tall as possible x 5               Scapular retraction x 10               Prone:              POE x 1 minute x 2              Glut and abdomina set combine x 10              Heel squeeze x 10              Supine:              Knee to chest 3 x 30"                Hamstring stretch 3 x 30"               Bridge x10              Clam x 10              Bent knee raise x 10               Side lying:  Hip abduction x 10   PATIENT EDUCATION:  Education details: Patient educated on exam findings, POC, scope of PT, HEP, and continuing to exercise. Person educated: Patient Education method: Explanation, Demonstration, and Handouts Education comprehension: verbalized understanding, returned demonstration, verbal cues required, and tactile cues required   HOME EXERCISE PROGRAM:              12/27/21              - Standing Row with Resistance with Anchored Resistance at Chest Height Palms Down  - 1 x daily - 7 x weekly - 1 sets - 10 reps - 3-5" hold - Standing Row with Anchored Resistance  - 1 x daily - 7 x weekly - 1 sets - 10 reps - 3-5"  hold - Single Arm Shoulder Extension with Anchored Resistance  - 1 x daily - 7 x weekly - 1 sets - 10 reps - 3-5"  hold  Access Code: [email or web address] 12/21/21 - Supine Transversus Abdominis Bracing - Hands on Stomach  - 2 x daily - 7 x weekly - 1 sets - 10 reps - 5 second hold - Supine March  - 2 x daily -  7 x weekly - 2 sets - 10 reps - Small Range Straight Leg Raise  - 2 x daily - 7 x weekly - 2 sets - 10 reps  12/15/21 - Prone Gluteal Sets  - 2 x daily - 7 x weekly - 1 sets - 10 reps - Prone Heel Squeeze  - 2 x daily - 7 x weekly - 1 sets - 10 reps - 5" hold - Supine Single Knee to Chest Stretch  - 2 x daily - 7 x weekly - 1 sets - 10 reps - 20" hold - Supine Hamstring Stretch  - 2 x daily - 7 x weekly - 1 sets - 10 reps - 20" hold Date: 12/06/2021 - Supine Bridge  - 2-3 x daily - 7 x weekly - 2-3 sets - 10 reps - Clamshell  - 2-3 x daily - 7 x weekly - 2-3 sets - 10 reps - Prone Press Up On Elbows  - 2-3 x daily - 7 x weekly - 5 reps - 20 second hold - Standing Lumbar Extension  - 2-3 x daily - 7 x weekly - 2 sets - 10 reps  ASSESSMENT:  CLINICAL IMPRESSION: Pt given theraband and instructions for HEP.  Manual decreased mm tightness approximately 50% this session.  Noted improved gait with walking exercise.  .  Patient will continue to benefit from skilled therapy services to reduce remaining deficits and improve functional ability.    OBJECTIVE IMPAIRMENTS Abnormal gait, decreased activity tolerance, decreased balance, decreased endurance, decreased mobility, difficulty walking, decreased ROM, decreased strength, increased muscle spasms, impaired flexibility, improper body mechanics, postural dysfunction, and pain.   ACTIVITY LIMITATIONS carrying, lifting, bending, standing, squatting, stairs, transfers, locomotion level, and caring for others  PARTICIPATION LIMITATIONS: meal prep, cleaning, laundry, shopping, community activity, and yard work  PERSONAL FACTORS Age, Time since onset of injury/illness/exacerbation, and 3+ comorbidities: Hx L TKA, HTN, osteoporosis, HLD, hx TB  are also affecting patient's functional outcome.   REHAB POTENTIAL: Good  CLINICAL DECISION MAKING: Stable/uncomplicated  EVALUATION COMPLEXITY: Low   GOALS: Goals reviewed with patient? Yes  SHORT TERM  GOALS: Target date: 12/27/2021  Patient will be independent with HEP in order to improve functional outcomes. Baseline:  Goal status: IN PROGRESS  2.  Patient will report at least 25%  improvement in symptoms for improved quality of life. Baseline:  Goal status: IN PROGRESS   LONG TERM GOALS: Target date: 01/17/2022  Patient will report at least 75% improvement in symptoms for improved quality of life. Baseline:  Goal status: IN PROGRESS  2.  Patient will improve FOTO score to expected outcomes in order to indicate improved tolerance to activity. Baseline: complete next session Goal status: IN PROGRESS  3.  Patient will demonstrate at least 25% improvement in lumbar ROM in all restricted planes for improved ability to move trunk while completing chores. Baseline: see AROM Goal status: IN PROGRESS  4.  Patient will be able to ambulate at least 325 feet in in order to demonstrate improved tolerance to activity. Baseline: 265 feet Goal status: IN PROGRESS  5.  Patient will demonstrate grade of 4+/5 MMT grade in all tested musculature as evidence of improved strength to assist with stair ambulation and gait.   Baseline: see MMT Goal status: IN PROGRESS    PLAN: PT FREQUENCY: 2x/week  PT DURATION: 6 weeks  PLANNED INTERVENTIONS: Therapeutic exercises, Therapeutic activity, Neuromuscular re-education, Balance training, Gait training, Patient/Family education, Joint manipulation, Joint mobilization, Stair training, Orthotic/Fit training, DME instructions, Aquatic Therapy, Dry Needling, Electrical stimulation, Spinal manipulation, Spinal mobilization, Cryotherapy, Moist heat, Compression bandaging, scar mobilization, Splintting, Taping, Traction, Ultrasound, Ionotophoresis 4mg /ml Dexamethasone, and Manual therapy  PLAN FOR NEXT SESSION:  continue to extension bias as patient stating improvement with most extension. Core and glute strength, lumbar mobility, possibly trial side  glides for lateral shift  , PT CLT [phone]  [phone]

## 2022-01-03 ENCOUNTER — Encounter (HOSPITAL_COMMUNITY): Payer: Medicare Other

## 2022-01-05 ENCOUNTER — Encounter (HOSPITAL_COMMUNITY): Payer: Self-pay

## 2022-01-05 ENCOUNTER — Ambulatory Visit (HOSPITAL_COMMUNITY): Payer: Medicare Other

## 2022-01-05 DIAGNOSIS — M6281 Muscle weakness (generalized): Secondary | ICD-10-CM | POA: Diagnosis not present

## 2022-01-05 DIAGNOSIS — R29898 Other symptoms and signs involving the musculoskeletal system: Secondary | ICD-10-CM | POA: Diagnosis not present

## 2022-01-05 DIAGNOSIS — R2689 Other abnormalities of gait and mobility: Secondary | ICD-10-CM

## 2022-01-05 DIAGNOSIS — M5459 Other low back pain: Secondary | ICD-10-CM

## 2022-01-05 NOTE — Therapy (Signed)
OUTPATIENT PHYSICAL THERAPY THORACOLUMBAR treatment.     Patient Name: Cassidy Bennett MRN: [id number] DOB:1940/07/17, 81 y.o., female Today's Date: 01/05/2022    PT End of Session - 01/05/22 1428     Visit Number 7    Number of Visits 12    Date for PT Re-Evaluation 01/17/22    Authorization Type Primary Medicare Secondary BCBS    Progress Note Due on Visit 10    PT Start Time 1400   late sign in   PT Stop Time 1428    PT Time Calculation (min) 28 min    Activity Tolerance Patient tolerated treatment well    Behavior During Therapy WFL for tasks assessed/performed                      Past Medical History:  Diagnosis Date   Allergy    Complication of anesthesia    History of TB (tuberculosis)    treated 45 plus years ago last cxr 03-15-18  CXR every 5 years   Hypercholesteremia    denies at preop   Hypertension    Osteoarthritis    Osteoporosis    PONV (postoperative nausea and vomiting)    Post-menopausal    Tuberculosis    Past Surgical History:  Procedure Laterality Date   CATARACT EXTRACTION  2018   right eye   COLONOSCOPY  2015   DILATION AND CURETTAGE OF UTERUS  2000   KNEE ARTHROSCOPY  2014   left   LUMBAR DISC SURGERY     SHOULDER SURGERY     right   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   Procedure: TOTAL KNEE ARTHROPLASTY;  Surgeon: Ollen Gross, MD;  Location: [facility];  Service: Orthopedics;  Laterality: Left;   Patient Active Problem List   Diagnosis Date Noted   Osteoarthritis of left knee 08/12/2018   Closed displaced fracture of proximal phalanx of right great toe 01/03/2017   Rotator cuff syndrome of right shoulder 01/03/2017   Hyperlipidemia 09/05/2016   Essential hypertension 08/27/2015   Urine abnormality 03/29/2015   Routine general medical examination at a health care facility 06/26/2013   History of TB (tuberculosis) 06/17/2012   Seasonal allergies 01/22/2008   Osteoporosis 12/13/2006   OA (osteoarthritis) of knee  10/11/2006    PCP: Jarold Motto PA  REFERRING PROVIDER: Glendale Chard, DO  Next apt 03/14/21  REFERRING DIAG: M54.17 (ICD-10-CM) - Lumbosacral radiculopathy M54.41,G89.29 (ICD-10-CM) - Chronic right-sided low back pain with right-sided sciatica   Rationale for Evaluation and Treatment Rehabilitation  THERAPY DIAG:  Muscle weakness (generalized)  Other low back pain  Other abnormalities of gait and mobility  Other symptoms and signs involving the musculoskeletal system  ONSET DATE: January 2023  SUBJECTIVE:  SUBJECTIVE STATEMENT: Pt stated she is feeling good today, reports main pain at night following 3-4 hours of sleeping and lateral thigh pain ending at knee.  Stated she gets up and walks around that resolves the pain.     PERTINENT HISTORY:  Hx L TKA, HTN, osteoporosis, HLD, hx TB  PAIN:  Are you having pain? Yes: NPRS scale: 0/10 Pain location: low back Pain description: sore Aggravating factors: sleeping, sitting Relieving factors: meds, movement  PATIENT GOALS feel better   OBJECTIVE:   DIAGNOSTIC FINDINGS:  XR 08/13/20 IMPRESSION: 1. Prominent spondylosis and facet hypertrophy at the lumbosacral junction. No acute bony abnormality.  PATIENT SURVEYS:  Foto :  40     POSTURE: rounded shoulders, forward head, decreased lumbar lordosis, and decreased thoracic kyphosis  PALPATION: Grossly hypomobile thoracic and lumbar spine, tender throughout thoracic/lumbar CPA; TTP lower lumbar paraspinals and L glute max  LUMBAR ROM:   Active  A/PROM  eval  Flexion 0% limited *  Extension 75% limited - felt good  Right lateral flexion 75% limited *  Left lateral flexion 75% limited *  Right rotation 50% limited *  Left rotation 50% limited *   (Blank rows = not tested) *=  pain   LOWER EXTREMITY MMT:    MMT Right eval Left eval  Hip flexion 4- 4-  Hip extension 3+ 3  Hip abduction 4- 3+  Hip adduction    Hip internal rotation    Hip external rotation    Knee flexion 4 4  Knee extension 4 4+  Ankle dorsiflexion 4- 4+  Ankle plantarflexion    Ankle inversion    Ankle eversion     (Blank rows = not tested)   FUNCTIONAL TESTS:  5 times sit to stand: 12.30 seconds without UE support 2 minute walk test: 265 feet  GAIT: Distance walked: 265 feet Assistive device utilized: None Level of assistance: Complete Independence Comments: , slight trunk flexion and R shift, slower cadence with gradually increasing LBP and LLE symptoms    TODAY'S TREATMENT  01/05/22 Standing: Side glides front of mirror for feedback  Weight shifting Standing lumbar extension 10x 5" Postural theraband exercises:  HEP Green: Rows x15 Scapular retraction x 15 Shoulder extension x 15  Squat then heel raise 15x no HHA front of chair for mechanics Wall arch with heel raise 15x Vector stance 2x 5" each LE with intermittent HHA  12/30/2021 Standing: Walking down the hall with fewer steps to increase stride length and concentrating on hitting with her heel first x 4 RT Postural theraband exercises: Green: Rows x15 Scapular retraction x 15 Shoulder extension x 15  Paloff x 10  wall arch x 10 Hip excursion  Prone:   POE one minute x 2 reps  Hip extension x 10 B Supine   Knee to chest B x 3 for 30"  Manual completed to thoracic and lumbar area to decrease tightness and pain 12/27/2021 Postural theraband exercises: Green: Rows x10 Scapular retraction x 10 Shoulder extension x 10  Standing :  wall arch x 15 POE one minute x 2 reps  Hip extension x 10 B Y, I and T x 10  Supine  Bridge 5" x 15  Knee to chest B x 3 for 30"  Manual completed to decrease mm tightness and pain.               12/21/21 POE 10 x 5" Piriformis stretch 3 x 30" each  Bridge  10 x 5"  Ab march x20 SLR with ab march 2 x 10 each  Band rows YTB 2 x 10 Shoulder extension YTB 2 x 10 Shoulder ER YTB at wall 2 x 10 Standing lumbar extension x10   Manual STM to bilateral lumbar paraspinals  Manual PA lumbar mobs grade I-II   12/19/21             Standing:             Wall arch x 10             Theraband postural exercises with green therband             Scapular retraction x 10             Rows x 10             Shoulder extension x 10             Standing extension x 10              Supine:             Decompression exercises with theraband             The overhead x 10             Side pull x 10             ER x 10              Sash x 10                          Knee to chest x 3 B            Piriformis stretch x 3 B            Bridge x 10              Prone:            POE            Press up x 5 2 sets             Single leg raise x 10                                      12/15/21              Wall arch              Sitting:               Sit as tall as possible x 5               Scapular retraction x 10               Prone:              POE x 1 minute x 2              Glut and abdomina set combine x 10              Heel squeeze x 10              Supine:              Knee to chest 3 x 30"  Hamstring stretch 3 x 30"               Bridge x10              Clam x 10              Bent knee raise x 10               Side lying:              Hip abduction x 10   PATIENT EDUCATION:  Education details: Patient educated on exam findings, POC, scope of PT, HEP, and continuing to exercise. Person educated: Patient Education method: Explanation, Demonstration, and Handouts Education comprehension: verbalized understanding, returned demonstration, verbal cues required, and tactile cues required   HOME EXERCISE PROGRAM:              12/27/21              - Standing Row with Resistance with Anchored Resistance at Chest Height  Palms Down  - 1 x daily - 7 x weekly - 1 sets - 10 reps - 3-5" hold - Standing Row with Anchored Resistance  - 1 x daily - 7 x weekly - 1 sets - 10 reps - 3-5"  hold - Single Arm Shoulder Extension with Anchored Resistance  - 1 x daily - 7 x weekly - 1 sets - 10 reps - 3-5"  hold  Access Code: [email or web address] 12/21/21 - Supine Transversus Abdominis Bracing - Hands on Stomach  - 2 x daily - 7 x weekly - 1 sets - 10 reps - 5 second hold - Supine March  - 2 x daily - 7 x weekly - 2 sets - 10 reps - Small Range Straight Leg Raise  - 2 x daily - 7 x weekly - 2 sets - 10 reps  12/15/21 - Prone Gluteal Sets  - 2 x daily - 7 x weekly - 1 sets - 10 reps - Prone Heel Squeeze  - 2 x daily - 7 x weekly - 1 sets - 10 reps - 5" hold - Supine Single Knee to Chest Stretch  - 2 x daily - 7 x weekly - 1 sets - 10 reps - 20" hold - Supine Hamstring Stretch  - 2 x daily - 7 x weekly - 1 sets - 10 reps - 20" hold Date: 12/06/2021 - Supine Bridge  - 2-3 x daily - 7 x weekly - 2-3 sets - 10 reps - Clamshell  - 2-3 x daily - 7 x weekly - 2-3 sets - 10 reps - Prone Press Up On Elbows  - 2-3 x daily - 7 x weekly - 5 reps - 20 second hold - Standing Lumbar Extension  - 2-3 x daily - 7 x weekly - 2 sets - 10 reps  ASSESSMENT:  CLINICAL IMPRESSION: Added side glides to address lateral shifting with positive results and no reports of pain, verbal and mirror feedback to improve postural awareness.  Continued with core and postural exercises with positive results.  Vector stance for hip stability.     OBJECTIVE IMPAIRMENTS Abnormal gait, decreased activity tolerance, decreased balance, decreased endurance, decreased mobility, difficulty walking, decreased ROM, decreased strength, increased muscle spasms, impaired flexibility, improper body mechanics, postural dysfunction, and pain.   ACTIVITY LIMITATIONS carrying, lifting, bending, standing, squatting, stairs, transfers, locomotion level, and caring for  others  PARTICIPATION LIMITATIONS: meal prep, cleaning, laundry, shopping, community activity, and yard work  PERSONAL FACTORS  Age, Time since onset of injury/illness/exacerbation, and 3+ comorbidities: Hx L TKA, HTN, osteoporosis, HLD, hx TB  are also affecting patient's functional outcome.   REHAB POTENTIAL: Good  CLINICAL DECISION MAKING: Stable/uncomplicated  EVALUATION COMPLEXITY: Low   GOALS: Goals reviewed with patient? Yes  SHORT TERM GOALS: Target date: 12/27/2021  Patient will be independent with HEP in order to improve functional outcomes. Baseline:  Goal status: IN PROGRESS  2.  Patient will report at least 25% improvement in symptoms for improved quality of life. Baseline:  Goal status: IN PROGRESS   LONG TERM GOALS: Target date: 01/17/2022  Patient will report at least 75% improvement in symptoms for improved quality of life. Baseline:  Goal status: IN PROGRESS  2.  Patient will improve FOTO score to expected outcomes in order to indicate improved tolerance to activity. Baseline: complete next session Goal status: IN PROGRESS  3.  Patient will demonstrate at least 25% improvement in lumbar ROM in all restricted planes for improved ability to move trunk while completing chores. Baseline: see AROM Goal status: IN PROGRESS  4.  Patient will be able to ambulate at least 325 feet in 2MWT in order to demonstrate improved tolerance to activity. Baseline: 265 feet Goal status: IN PROGRESS  5.  Patient will demonstrate grade of 4+/5 MMT grade in all tested musculature as evidence of improved strength to assist with stair ambulation and gait.   Baseline: see MMT Goal status: IN PROGRESS    PLAN: PT FREQUENCY: 2x/week  PT DURATION: 6 weeks  PLANNED INTERVENTIONS: Therapeutic exercises, Therapeutic activity, Neuromuscular re-education, Balance training, Gait training, Patient/Family education, Joint manipulation, Joint mobilization, Stair training,  Orthotic/Fit training, DME instructions, Aquatic Therapy, Dry Needling, Electrical stimulation, Spinal manipulation, Spinal mobilization, Cryotherapy, Moist heat, Compression bandaging, scar mobilization, Splintting, Taping, Traction, Ultrasound, Ionotophoresis 4mg /ml Dexamethasone, and Manual therapy  PLAN FOR NEXT SESSION:  continue to extension bias as patient stating improvement with most extension. Core and glute strength, lumbar mobility, possibly trial side glides for lateral shift  Becky Saxasey Kristi Hyer, LPTA/CLT; Rowe ClackBIS [phone]

## 2022-01-10 ENCOUNTER — Encounter (HOSPITAL_COMMUNITY): Payer: Self-pay

## 2022-01-10 ENCOUNTER — Ambulatory Visit (HOSPITAL_COMMUNITY): Payer: Medicare Other

## 2022-01-10 DIAGNOSIS — R29898 Other symptoms and signs involving the musculoskeletal system: Secondary | ICD-10-CM | POA: Diagnosis not present

## 2022-01-10 DIAGNOSIS — M6281 Muscle weakness (generalized): Secondary | ICD-10-CM

## 2022-01-10 DIAGNOSIS — R2689 Other abnormalities of gait and mobility: Secondary | ICD-10-CM

## 2022-01-10 DIAGNOSIS — M5459 Other low back pain: Secondary | ICD-10-CM

## 2022-01-10 NOTE — Therapy (Addendum)
OUTPATIENT PHYSICAL THERAPY THORACOLUMBAR treatment.     Patient Name: Cassidy PeakChristine K Loux MRN: [id number] DOB:[birth year], 81 y.o., female Today's Date: 01/10/2022    PT End of Session - 01/10/22 1340     Visit Number 8    Number of Visits 12    Date for PT Re-Evaluation 01/17/22    Authorization Type Primary Medicare Secondary BCBS    Progress Note Due on Visit 10    PT Start Time 1302    PT Stop Time 1340    PT Time Calculation (min) 38 min    Activity Tolerance Patient tolerated treatment well    Behavior During Therapy WFL for tasks assessed/performed                       Past Medical History:  Diagnosis Date   Allergy    Complication of anesthesia    History of TB (tuberculosis)    treated 45 plus years ago last cxr 03-15-18  CXR every 5 years   Hypercholesteremia    denies at preop   Hypertension    Osteoarthritis    Osteoporosis    PONV (postoperative nausea and vomiting)    Post-menopausal    Tuberculosis    Past Surgical History:  Procedure Laterality Date   CATARACT EXTRACTION  2018   right eye   COLONOSCOPY  2015   DILATION AND CURETTAGE OF UTERUS  2000   KNEE ARTHROSCOPY  2014   left   LUMBAR DISC SURGERY     SHOULDER SURGERY     right   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   Procedure: TOTAL KNEE ARTHROPLASTY;  Surgeon: Ollen GrossAluisio, Frank, MD;  Location: [facility];  Service: Orthopedics;  Laterality: Left;   Patient Active Problem List   Diagnosis Date Noted   Osteoarthritis of left knee 08/12/2018   Closed displaced fracture of proximal phalanx of right great toe 01/03/2017   Rotator cuff syndrome of right shoulder 01/03/2017   Hyperlipidemia 09/05/2016   Essential hypertension 08/27/2015   Urine abnormality 03/29/2015   Routine general medical examination at a health care facility 06/26/2013   History of TB (tuberculosis) 06/17/2012   Seasonal allergies 01/22/2008   Osteoporosis 12/13/2006   OA (osteoarthritis) of knee 10/11/2006     PCP: Jarold MottoSamantha Worley PA  REFERRING PROVIDER: Glendale ChardPatel, Donika K, DO  Next apt 03/14/21  REFERRING DIAG: M54.17 (ICD-10-CM) - Lumbosacral radiculopathy M54.41,G89.29 (ICD-10-CM) - Chronic right-sided low back pain with right-sided sciatica   Rationale for Evaluation and Treatment Rehabilitation  THERAPY DIAG:  Muscle weakness (generalized)  Other low back pain  Other abnormalities of gait and mobility  ONSET DATE: January 2023  SUBJECTIVE:  SUBJECTIVE STATEMENT: Pt stated she continues to waken following 3-4 hours of sleeping.  Stated she increased pain following standing cooking last night and doing the dishes this morning.  Has radicular symptoms Lt glut to lateral thigh.  Feels she is making improvement with therapy since began.   PERTINENT HISTORY:  Hx L TKA, HTN, osteoporosis, HLD, hx TB  PAIN:  Are you having pain? Yes: NPRS scale: 4/10 Pain location: low back Pain description: sore Aggravating factors: sleeping, sitting Relieving factors: meds, movement  PATIENT GOALS feel better   OBJECTIVE:   DIAGNOSTIC FINDINGS:  XR 08/13/20 IMPRESSION: 1. Prominent spondylosis and facet hypertrophy at the lumbosacral junction. No acute bony abnormality.  PATIENT SURVEYS:  Foto :  40     POSTURE: rounded shoulders, forward head, decreased lumbar lordosis, and decreased thoracic kyphosis  PALPATION: Grossly hypomobile thoracic and lumbar spine, tender throughout thoracic/lumbar CPA; TTP lower lumbar paraspinals and L glute max  LUMBAR ROM:   Active  A/PROM  eval  Flexion 0% limited *  Extension 75% limited - felt good  Right lateral flexion 75% limited *  Left lateral flexion 75% limited *  Right rotation 50% limited *  Left rotation 50% limited *   (Blank rows = not tested) *=  pain   LOWER EXTREMITY MMT:    MMT Right eval Left eval  Hip flexion 4- 4-  Hip extension 3+ 3  Hip abduction 4- 3+  Hip adduction    Hip internal rotation    Hip external rotation    Knee flexion 4 4  Knee extension 4 4+  Ankle dorsiflexion 4- 4+  Ankle plantarflexion    Ankle inversion    Ankle eversion     (Blank rows = not tested)   FUNCTIONAL TESTS:  5 times sit to stand: 12.30 seconds without UE support 2 minute walk test: 265 feet  GAIT: Distance walked: 265 feet Assistive device utilized: None Level of assistance: Complete Independence Comments: , slight trunk flexion and R shift, slower cadence with gradually increasing LBP and LLE symptoms    TODAY'S TREATMENT  01/10/22 Prone: POE x 2 min 2 sets- cueing to relax gluteal mm Hip extension 10x 3" Standing: Side glides front of mirror for feedback  3D hip excursion 10x (limited extension noted) Wall arch with heel raise 10x 3" Prone: child's pose 3x 30" limited Lt knee ROM, 2 yr post-op  01/05/22 Standing: Side glides front of mirror for feedback  Weight shifting Standing lumbar extension 10x 5" Postural theraband exercises:  HEP Green: Rows x15 Scapular retraction x 15 Shoulder extension x 15  Squat then heel raise 15x no HHA front of chair for mechanics Wall arch with heel raise 15x Vector stance 2x 5" each LE with intermittent HHA  12/30/2021 Standing: Walking down the hall with fewer steps to increase stride length and concentrating on hitting with her heel first x 4 RT Postural theraband exercises: Green: Rows x15 Scapular retraction x 15 Shoulder extension x 15  Paloff x 10  wall arch x 10 Hip excursion  Prone:   POE one minute x 2 reps  Hip extension x 10 B Supine   Knee to chest B x 3 for 30"  Manual completed to thoracic and lumbar area to decrease tightness and pain 12/27/2021 Postural theraband exercises: Green: Rows x10 Scapular retraction x 10 Shoulder extension x  10  Standing :  wall arch x 15 POE one minute x 2 reps  Hip extension x 10 B  Y, I and T x 10  Supine  Bridge 5" x 15  Knee to chest B x 3 for 30"  Manual completed to decrease mm tightness and pain.               12/21/21 POE 10 x 5" Piriformis stretch 3 x 30" each  Bridge 10 x 5"  Ab march x20 SLR with ab march 2 x 10 each  Band rows YTB 2 x 10 Shoulder extension YTB 2 x 10 Shoulder ER YTB at wall 2 x 10 Standing lumbar extension x10   Manual STM to bilateral lumbar paraspinals  Manual PA lumbar mobs grade I-II   12/19/21             Standing:             Wall arch x 10             Theraband postural exercises with green therband             Scapular retraction x 10             Rows x 10             Shoulder extension x 10             Standing extension x 10              Supine:             Decompression exercises with theraband             The overhead x 10             Side pull x 10             ER x 10              Sash x 10                          Knee to chest x 3 B            Piriformis stretch x 3 B            Bridge x 10              Prone:            POE            Press up x 5 2 sets             Single leg raise x 10                                      12/15/21              Wall arch              Sitting:               Sit as tall as possible x 5               Scapular retraction x 10               Prone:              POE x 1 minute x 2              Glut and abdomina set combine x 10  Heel squeeze x 10              Supine:              Knee to chest 3 x 30"                Hamstring stretch 3 x 30"               Bridge x10              Clam x 10              Bent knee raise x 10               Side lying:              Hip abduction x 10   PATIENT EDUCATION:  Education details: Patient educated on exam findings, POC, scope of PT, HEP, and continuing to exercise. Person educated: Patient Education method: Explanation,  Demonstration, and Handouts Education comprehension: verbalized understanding, returned demonstration, verbal cues required, and tactile cues required   HOME EXERCISE PROGRAM: 01/10/22: vector stance               12/27/21              - Standing Row with Resistance with Anchored Resistance at Chest Height Palms Down  - 1 x daily - 7 x weekly - 1 sets - 10 reps - 3-5" hold - Standing Row with Anchored Resistance  - 1 x daily - 7 x weekly - 1 sets - 10 reps - 3-5"  hold - Single Arm Shoulder Extension with Anchored Resistance  - 1 x daily - 7 x weekly - 1 sets - 10 reps - 3-5"  hold  Access Code: [email or web address] 12/21/21 - Supine Transversus Abdominis Bracing - Hands on Stomach  - 2 x daily - 7 x weekly - 1 sets - 10 reps - 5 second hold - Supine March  - 2 x daily - 7 x weekly - 2 sets - 10 reps - Small Range Straight Leg Raise  - 2 x daily - 7 x weekly - 2 sets - 10 reps  12/15/21 - Prone Gluteal Sets  - 2 x daily - 7 x weekly - 1 sets - 10 reps - Prone Heel Squeeze  - 2 x daily - 7 x weekly - 1 sets - 10 reps - 5" hold - Supine Single Knee to Chest Stretch  - 2 x daily - 7 x weekly - 1 sets - 10 reps - 20" hold - Supine Hamstring Stretch  - 2 x daily - 7 x weekly - 1 sets - 10 reps - 20" hold Date: 12/06/2021 - Supine Bridge  - 2-3 x daily - 7 x weekly - 2-3 sets - 10 reps - Clamshell  - 2-3 x daily - 7 x weekly - 2-3 sets - 10 reps - Prone Press Up On Elbows  - 2-3 x daily - 7 x weekly - 5 reps - 20 second hold - Standing Lumbar Extension  - 2-3 x daily - 7 x weekly - 2 sets - 10 reps  ASSESSMENT:  CLINICAL IMPRESSION: Presents with improved centralization following prone position and improved awareness with neutral vs one lateral side shifting with less manual cueing required this session.  Continued session focus with core, proximal and postural strengthening exercises.  Pt c/o lower back tightness during wall arch exercise, added child's pose for mobility.  EOS  reports of radicular  symptoms resolved and decreased pain.     OBJECTIVE IMPAIRMENTS Abnormal gait, decreased activity tolerance, decreased balance, decreased endurance, decreased mobility, difficulty walking, decreased ROM, decreased strength, increased muscle spasms, impaired flexibility, improper body mechanics, postural dysfunction, and pain.   ACTIVITY LIMITATIONS carrying, lifting, bending, standing, squatting, stairs, transfers, locomotion level, and caring for others  PARTICIPATION LIMITATIONS: meal prep, cleaning, laundry, shopping, community activity, and yard work  PERSONAL FACTORS Age, Time since onset of injury/illness/exacerbation, and 3+ comorbidities: Hx L TKA, HTN, osteoporosis, HLD, hx TB  are also affecting patient's functional outcome.   REHAB POTENTIAL: Good  CLINICAL DECISION MAKING: Stable/uncomplicated  EVALUATION COMPLEXITY: Low   GOALS: Goals reviewed with patient? Yes  SHORT TERM GOALS: Target date: 12/27/2021  Patient will be independent with HEP in order to improve functional outcomes. Baseline:  Goal status: IN PROGRESS  2.  Patient will report at least 25% improvement in symptoms for improved quality of life. Baseline:  Goal status: IN PROGRESS   LONG TERM GOALS: Target date: 01/17/2022  Patient will report at least 75% improvement in symptoms for improved quality of life. Baseline:  Goal status: IN PROGRESS  2.  Patient will improve FOTO score to expected outcomes in order to indicate improved tolerance to activity. Baseline: complete next session Goal status: IN PROGRESS  3.  Patient will demonstrate at least 25% improvement in lumbar ROM in all restricted planes for improved ability to move trunk while completing chores. Baseline: see AROM Goal status: IN PROGRESS  4.  Patient will be able to ambulate at least 325 feet in in order to demonstrate improved tolerance to activity. Baseline: 265 feet Goal status: IN PROGRESS  5.  Patient will demonstrate  grade of 4+/5 MMT grade in all tested musculature as evidence of improved strength to assist with stair ambulation and gait.   Baseline: see MMT Goal status: IN PROGRESS    PLAN: PT FREQUENCY: 2x/week  PT DURATION: 6 weeks  PLANNED INTERVENTIONS: Therapeutic exercises, Therapeutic activity, Neuromuscular re-education, Balance training, Gait training, Patient/Family education, Joint manipulation, Joint mobilization, Stair training, Orthotic/Fit training, DME instructions, Aquatic Therapy, Dry Needling, Electrical stimulation, Spinal manipulation, Spinal mobilization, Cryotherapy, Moist heat, Compression bandaging, scar mobilization, Splintting, Taping, Traction, Ultrasound, Ionotophoresis /ml Dexamethasone, and Manual therapy  PLAN FOR NEXT SESSION:  continue to extension bias as patient stating improvement with most extension. Core and glute strength, lumbar mobility, possibly trial side glides for lateral shift  Becky Sax, LPTA/CLT; Rowe Clack [phone]

## 2022-01-12 ENCOUNTER — Ambulatory Visit (HOSPITAL_COMMUNITY): Payer: Medicare Other | Admitting: Physical Therapy

## 2022-01-12 DIAGNOSIS — R29898 Other symptoms and signs involving the musculoskeletal system: Secondary | ICD-10-CM

## 2022-01-12 DIAGNOSIS — M6281 Muscle weakness (generalized): Secondary | ICD-10-CM | POA: Diagnosis not present

## 2022-01-12 DIAGNOSIS — M5459 Other low back pain: Secondary | ICD-10-CM

## 2022-01-12 DIAGNOSIS — R2689 Other abnormalities of gait and mobility: Secondary | ICD-10-CM | POA: Diagnosis not present

## 2022-01-12 NOTE — Progress Notes (Signed)
Cassidy Bennett is a 81 y.o. female here for a follow up of a pre-existing problem.  History of Present Illness:   Chief Complaint  Patient presents with   Hypertension    Pt is checking blood pressure 2 x's a week averaging 122-150 systolic, 70-86 diastolic for the month of Oct.    HPI  Hypertension  At home blood pressure readings were 122-150/70-86 for the month of October.  She is currently taking amlodipine 5 mg daily and is compliant with this. Denies excessive caffeine intake, stimulant usage, excessive alcohol intake, or increase in salt consumption. Patient denies chest pain, SOB, blurred vision, dizziness, unusual headaches, lower leg swelling.  BP Readings from Last 3 Encounters:  01/16/22 (!) 142/80  11/07/21 (!) 159/79  10/14/21 (!) 150/90   Elevated glucose She is due for A1c re-check.  She is currently not on medication. Last A1c was 10/14/21.  HLD Currently not on any medication at this time. She is due for lipid recheck today.  Fatigue Patient complains of not sleeping well, constantly waking up during the night. She has back pain that also contributes to her waking up during the night. She continues to take gabapentin 100 MG nightly for back pain, she has the ability/permission to take 200 mg nightly but she has not done this out of fear of it being too strong for her.  Takes 10 mg of Melatonin but is not finding any relief.    She complains of losing weight, unsure why because she has a well balanced diet. She has lost about 2 lb over the past 2 months.   Tingling to fingertips Reports that yesterday she had some tingling to her L hand fingertips on 2nd, 3rd and 4th fingertips. She took an ASA and it went away. Denies chest pain, SOB, any other numbness/tingling anywhere, weakness, unusual HA or other concerns.   Past Medical History:  Diagnosis Date   Allergy    Complication of anesthesia    History of TB (tuberculosis)    treated 45 plus  years ago last cxr 03-15-18  CXR every 5 years   Hypercholesteremia    denies at preop   Hypertension    Osteoarthritis    Osteoporosis    PONV (postoperative nausea and vomiting)    Post-menopausal    Tuberculosis      Social History   Tobacco Use   Smoking status: Never   Smokeless tobacco: Never  Vaping Use   Vaping Use: Never used  Substance Use Topics   Alcohol use: Not Currently    Alcohol/week: 5.0 standard drinks of alcohol    Types: 5 Glasses of wine per week   Drug use: No    Past Surgical History:  Procedure Laterality Date   CATARACT EXTRACTION  2018   right eye   COLONOSCOPY  2015   DILATION AND CURETTAGE OF UTERUS  2000   KNEE ARTHROSCOPY  2014   left   LUMBAR DISC SURGERY     SHOULDER SURGERY     right   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   Procedure: TOTAL KNEE ARTHROPLASTY;  Surgeon: Ollen Gross, MD;  Location: [facility];  Service: Orthopedics;  Laterality: Left;    Family History  Problem Relation Age of Onset   Other Mother        malaria   Other Father    Cancer Other        stomach   Breast cancer Neg Hx     Allergies  Allergen Reactions   Metoprolol Nausea And Vomiting   Morphine And Related Nausea And Vomiting   Other Nausea And Vomiting    Current Medications:   Current Outpatient Medications:    amLODipine (NORVASC) 5 MG tablet, Take 1 tablet (5 mg total) by mouth daily., Disp: 90 tablet, Rfl: 1   Ascorbic Acid (VITAMIN C PO), Take 1,000 mg by mouth daily. , Disp: , Rfl:    b complex vitamins tablet, Take 1 tablet by mouth daily. Every other day, Disp: , Rfl:    Calcium Carb-Cholecalciferol (CALCIUM 600 + D PO), Take 1 tablet by mouth daily., Disp: , Rfl:    gabapentin (NEURONTIN) 100 MG capsule, Take 1 tablet at bedtime x 1 week, then increase to 2 tablet at bedtime. (Patient taking differently: Take 1 tablet at bedtime x 1 week, then increase to 2 tablet at bedtime. Takes prn), Disp: 60 capsule, Rfl: 5   magnesium 30 MG  tablet, Take 30 mg by mouth 3 (three) times a week., Disp: , Rfl:    polyethylene glycol (MIRALAX / GLYCOLAX) packet, Take 17 g by mouth daily as needed for moderate constipation. , Disp: , Rfl:    tizanidine (ZANAFLEX) 2 MG capsule, Take 1 capsule (2 mg total) by mouth at bedtime as needed for muscle spasms., Disp: 30 capsule, Rfl: 3   Vitamin A 2400 MCG (8000 UT) TABS, Take 8,000 Units by mouth daily. , Disp: , Rfl:    vitamin B-12 (CYANOCOBALAMIN) 1000 MCG tablet, Take 1,000 mcg by mouth daily., Disp: , Rfl:    vitamin E 400 UNIT capsule, Take 400 Units by mouth daily., Disp: , Rfl:    Review of Systems:   Review of Systems  Constitutional:  Negative for chills, fever, malaise/fatigue and weight loss.  HENT:  Negative for hearing loss, sinus pain and sore throat.   Respiratory:  Negative for cough and hemoptysis.   Cardiovascular:  Negative for chest pain, palpitations, leg swelling and PND.  Gastrointestinal:  Negative for abdominal pain, constipation, diarrhea, heartburn, nausea and vomiting.  Genitourinary:  Negative for dysuria, frequency and urgency.  Musculoskeletal:  Negative for back pain, myalgias and neck pain.  Skin:  Negative for itching and rash.  Neurological:  Negative for dizziness, tingling, seizures and headaches.  Endo/Heme/Allergies:  Negative for polydipsia.  Psychiatric/Behavioral:  Negative for depression. The patient is not nervous/anxious.     Vitals:   Vitals:   01/16/22 1305  BP: (!) 142/80  Pulse: 80  Temp: 98.2 F (36.8 C)  TempSrc: Temporal  SpO2: 98%  Weight: 112 lb (50.8 kg)  Height: 5\' 2"  (1.575 m)     Body mass index is 20.49 kg/m.  Physical Exam:   Physical Exam Vitals and nursing note reviewed.  Constitutional:      General: She is not in acute distress.    Appearance: She is well-developed. She is not ill-appearing or toxic-appearing.  Cardiovascular:     Rate and Rhythm: Normal rate and regular rhythm.     Pulses: Normal pulses.      Heart sounds: Normal heart sounds, S1 normal and S2 normal.  Pulmonary:     Effort: Pulmonary effort is normal.     Breath sounds: Normal breath sounds.  Skin:    General: Skin is warm and dry.  Neurological:     General: No focal deficit present.     Mental Status: She is alert.     GCS: GCS eye subscore is 4. GCS verbal subscore is  5. GCS motor subscore is 6.     Cranial Nerves: Cranial nerves 2-12 are intact.     Sensory: Sensation is intact.     Motor: Motor function is intact.     Coordination: Coordination is intact.     Gait: Gait is intact.  Psychiatric:        Speech: Speech normal.        Behavior: Behavior normal. Behavior is cooperative.     Assessment and Plan:   Mixed hyperlipidemia Update lipid panel and make recommendations accordingly  Essential hypertension Above goal Home BP are more consistently normal No evidence of end-organ damage Recommend continued monitoring and follow-up if BP remains elevated > 150/90  Fatigue, unspecified type Will work on improving back pain as this is affecting her sleep Recommend increasing gabapentin to 200 mg nightly May continue melatonin 10 mg daily Continue to weigh self regularly, follow-up if weights continue to decrease Update blood work today Follow-up if new/worsening sx  Elevated glucose Update A1c and provide recommendations accordingly  Paresthesias in left hand Resolved and was without associated neurological sx If persists, let us know  I,Moesha Myer,acting as a scribe for [employer], PA.,have documented all relevant documentation on the behalf of Jarold Motto, PA,as directed by  Jarold Motto, PA while in the presence of Jarold Motto, [place].  I, Jarold Motto, [place], have reviewed all documentation for this visit. The documentation on 01/16/22 for the exam, diagnosis, procedures, and orders are all accurate and complete.  Jarold Motto PA-C

## 2022-01-12 NOTE — Therapy (Signed)
OUTPATIENT PHYSICAL THERAPY THORACOLUMBAR treatment.     Patient Name: Cassidy Bennett MRN: [id number] DOB:[birth year], 81 y.o., female Today's Date: 01/12/2022    PT End of Session - 01/12/22 1300     Visit Number 9    Number of Visits 12    Date for PT Re-Evaluation 01/17/22    Authorization Type Primary Medicare Secondary BCBS    Progress Note Due on Visit 10    PT Start Time 1300    PT Stop Time 1340    PT Time Calculation (min) 40 min    Activity Tolerance Patient tolerated treatment well    Behavior During Therapy WFL for tasks assessed/performed                       Past Medical History:  Diagnosis Date   Allergy    Complication of anesthesia    History of TB (tuberculosis)    treated 45 plus years ago last cxr 03-15-18  CXR every 5 years   Hypercholesteremia    denies at preop   Hypertension    Osteoarthritis    Osteoporosis    PONV (postoperative nausea and vomiting)    Post-menopausal    Tuberculosis    Past Surgical History:  Procedure Laterality Date   CATARACT EXTRACTION  2018   right eye   COLONOSCOPY  2015   DILATION AND CURETTAGE OF UTERUS  2000   KNEE ARTHROSCOPY  2014   left   LUMBAR DISC SURGERY     SHOULDER SURGERY     right   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   Procedure: TOTAL KNEE ARTHROPLASTY;  Surgeon: Ollen Gross, MD;  Location: [facility];  Service: Orthopedics;  Laterality: Left;   Patient Active Problem List   Diagnosis Date Noted   Osteoarthritis of left knee 08/12/2018   Closed displaced fracture of proximal phalanx of right great toe 01/03/2017   Rotator cuff syndrome of right shoulder 01/03/2017   Hyperlipidemia 09/05/2016   Essential hypertension 08/27/2015   Urine abnormality 03/29/2015   Routine general medical examination at a health care facility 06/26/2013   History of TB (tuberculosis) 06/17/2012   Seasonal allergies 01/22/2008   Osteoporosis 12/13/2006   OA (osteoarthritis) of knee 10/11/2006     PCP: Jarold Motto PA  REFERRING PROVIDER: Glendale Chard, DO  Next apt 03/14/21  REFERRING DIAG: M54.17 (ICD-10-CM) - Lumbosacral radiculopathy M54.41,G89.29 (ICD-10-CM) - Chronic right-sided low back pain with right-sided sciatica   Rationale for Evaluation and Treatment Rehabilitation  THERAPY DIAG:  Muscle weakness (generalized)  Other abnormalities of gait and mobility  Other symptoms and signs involving the musculoskeletal system  Other low back pain  ONSET DATE: January 2023  SUBJECTIVE:  SUBJECTIVE STATEMENT: Pt states her back mostly bothers her at night when she's sleeping and wakes approx after 4 hours of sleeping.  Continued radicular symptoms Lt glut to lateral thigh.  States her Lt knee was hurting really bad last night and took a gabapentin to help reduce pain.  Continues to have 4/10 pain   PERTINENT HISTORY:  Hx L TKA, HTN, osteoporosis, HLD, hx TB  PAIN:  Are you having pain? Yes: NPRS scale: 4/10 Pain location: low back Pain description: sore Aggravating factors: sleeping, sitting Relieving factors: meds, movement  PATIENT GOALS feel better   OBJECTIVE:   DIAGNOSTIC FINDINGS:  XR 08/13/20 IMPRESSION: 1. Prominent spondylosis and facet hypertrophy at the lumbosacral junction. No acute bony abnormality.  PATIENT SURVEYS:  Foto :  40   POSTURE: rounded shoulders, forward head, decreased lumbar lordosis, and decreased thoracic kyphosis  PALPATION: Grossly hypomobile thoracic and lumbar spine, tender throughout thoracic/lumbar CPA; TTP lower lumbar paraspinals and L glute max  LUMBAR ROM:   Active  A/PROM  eval  Flexion 0% limited *  Extension 75% limited - felt good  Right lateral flexion 75% limited *  Left lateral flexion 75% limited *  Right rotation  50% limited *  Left rotation 50% limited *   (Blank rows = not tested) *= pain   LOWER EXTREMITY MMT:    MMT Right eval Left eval  Hip flexion 4- 4-  Hip extension 3+ 3  Hip abduction 4- 3+  Hip adduction    Hip internal rotation    Hip external rotation    Knee flexion 4 4  Knee extension 4 4+  Ankle dorsiflexion 4- 4+  Ankle plantarflexion    Ankle inversion    Ankle eversion     (Blank rows = not tested)   FUNCTIONAL TESTS:  5 times sit to stand: 12.30 seconds without UE support 2 minute walk test: 265 feet  GAIT: Distance walked: 265 feet Assistive device utilized: None Level of assistance: Complete Independence Comments: 2MWT, slight trunk flexion and R shift, slower cadence with gradually increasing LBP and LLE symptoms    TODAY'S TREATMENT  01/12/22 Standing:3D hip excursions 10X each  Wall arch with heel raises 2X10  Hip extension 2X10  Hip abduction 2X10  Postural GTB exercise: scap retractions, rows, extensions 2X10  Vector stance 10X5" each LE with 1 UE assist Seated:  hamstring 3X30" each  Piriformis stretch 3X30" each Gait with increased stride, heel to toe gait  01/10/22 Prone: POE x 2 min 2 sets- cueing to relax gluteal mm Hip extension 10x 3" Standing: Side glides front of mirror for feedback  3D hip excursion 10x (limited extension noted) Wall arch with heel raise 10x 3" Prone: child's pose 3x 30" limited Lt knee ROM, 2 yr post-op  01/05/22 Standing: Side glides front of mirror for feedback  Weight shifting Standing lumbar extension 10x 5" Postural theraband exercises:  HEP Green: Rows x15 Scapular retraction x 15 Shoulder extension x 15  Squat then heel raise 15x no HHA front of chair for mechanics Wall arch with heel raise 15x Vector stance 2x 5" each LE with intermittent HHA  12/30/2021 Standing: Walking down the hall with fewer steps to increase stride length and concentrating on hitting with her heel first x 4 RT Postural  theraband exercises: Green: Rows x15 Scapular retraction x 15 Shoulder extension x 15  Paloff x 10  wall arch x 10 Hip excursion  Prone:   POE one minute x 2 reps  Hip extension x 10 B Supine   Knee to chest B x 3 for 30"  Manual completed to thoracic and lumbar area to decrease tightness and pain 12/27/2021 Postural theraband exercises: Green: Rows x10 Scapular retraction x 10 Shoulder extension x 10  Standing :  wall arch x 15 POE one minute x 2 reps  Hip extension x 10 B Y, I and T x 10  Supine  Bridge 5" x 15  Knee to chest B x 3 for 30"  Manual completed to decrease mm tightness and pain.               12/21/21 POE 10 x 5" Piriformis stretch 3 x 30" each  Bridge 10 x 5"  Ab march x20 SLR with ab march 2 x 10 each  Band rows YTB 2 x 10 Shoulder extension YTB 2 x 10 Shoulder ER YTB at wall 2 x 10 Standing lumbar extension x10   Manual STM to bilateral lumbar paraspinals  Manual PA lumbar mobs grade I-II   12/19/21             Standing:             Wall arch x 10             Theraband postural exercises with green therband             Scapular retraction x 10             Rows x 10             Shoulder extension x 10             Standing extension x 10              Supine:             Decompression exercises with theraband             The overhead x 10             Side pull x 10             ER x 10              Sash x 10                          Knee to chest x 3 B            Piriformis stretch x 3 B            Bridge x 10              Prone:            POE            Press up x 5 2 sets             Single leg raise x 10                                      12/15/21              Wall arch              Sitting:               Sit as tall as possible x 5  Scapular retraction x 10               Prone:              POE x 1 minute x 2              Glut and abdomina set combine x 10              Heel squeeze x 10               Supine:              Knee to chest 3 x 30"                Hamstring stretch 3 x 30"               Bridge x10              Clam x 10              Bent knee raise x 10               Side lying:              Hip abduction x 10   PATIENT EDUCATION:  Education details: Patient educated on exam findings, POC, scope of PT, HEP, and continuing to exercise. Person educated: Patient Education method: Explanation, Demonstration, and Handouts Education comprehension: verbalized understanding, returned demonstration, verbal cues required, and tactile cues required   HOME EXERCISE PROGRAM: Access Code: [email or web address] URL: [email or web address] Date: 01/12/2022 Prepared by: Emeline Gins Exercises - Seated Piriformis Stretch with Trunk Bend  - 2 x daily - 7 x weekly - 1 sets - 3 reps - 30 sec hold - Seated Hamstring Stretch  - 2 x daily - 7 x weekly - 1 sets - 3 reps - 30 sec hold  01/10/22: vector stance               12/27/21              - Standing Row with Resistance with Anchored Resistance at Chest Height Palms Down  - 1 x daily - 7 x weekly - 1 sets - 10 reps - 3-5" hold - Standing Row with Anchored Resistance  - 1 x daily - 7 x weekly - 1 sets - 10 reps - 3-5"  hold - Single Arm Shoulder Extension with Anchored Resistance  - 1 x daily - 7 x weekly - 1 sets - 10 reps - 3-5"  hold  Access Code: [email or web address] 12/21/21 - Supine Transversus Abdominis Bracing - Hands on Stomach  - 2 x daily - 7 x weekly - 1 sets - 10 reps - 5 second hold - Supine March  - 2 x daily - 7 x weekly - 2 sets - 10 reps - Small Range Straight Leg Raise  - 2 x daily - 7 x weekly - 2 sets - 10 reps  12/15/21 - Prone Gluteal Sets  - 2 x daily - 7 x weekly - 1 sets - 10 reps - Prone Heel Squeeze  - 2 x daily - 7 x weekly - 1 sets - 10 reps - 5" hold - Supine Single Knee to Chest Stretch  - 2 x daily - 7 x weekly - 1 sets - 10 reps - 20" hold - Supine Hamstring Stretch  - 2 x daily - 7 x weekly - 1 sets - 10 reps  - 20" hold Date:  12/06/2021 - Supine Bridge  - 2-3 x daily - 7 x weekly - 2-3 sets - 10 reps - Clamshell  - 2-3 x daily - 7 x weekly - 2-3 sets - 10 reps - Prone Press Up On Elbows  - 2-3 x daily - 7 x weekly - 5 reps - 20 second hold - Standing Lumbar Extension  - 2-3 x daily - 7 x weekly - 2 sets - 10 reps  ASSESSMENT:  CLINICAL IMPRESSION: Continued with lumbar mobility and core stability including hip strengthening.  Added stretches today with extreme tightness of piriformis bilaterally, Lt tighter than Rt. Added these to HEP.  Extension continues to improve centralization of symptoms.  Cues required to complete therex slowly and controlled and increase hold times with theraband.  Pt with minimal c/o discomfort during session today. EOS reports of radicular symptoms resolved and decreased pain. Pt will continue to benefit from skilled therapy to address symptoms and improve overall function.  OBJECTIVE IMPAIRMENTS Abnormal gait, decreased activity tolerance, decreased balance, decreased endurance, decreased mobility, difficulty walking, decreased ROM, decreased strength, increased muscle spasms, impaired flexibility, improper body mechanics, postural dysfunction, and pain.   ACTIVITY LIMITATIONS carrying, lifting, bending, standing, squatting, stairs, transfers, locomotion level, and caring for others  PARTICIPATION LIMITATIONS: meal prep, cleaning, laundry, shopping, community activity, and yard work  PERSONAL FACTORS Age, Time since onset of injury/illness/exacerbation, and 3+ comorbidities: Hx L TKA, HTN, osteoporosis, HLD, hx TB  are also affecting patient's functional outcome.   REHAB POTENTIAL: Good  CLINICAL DECISION MAKING: Stable/uncomplicated  EVALUATION COMPLEXITY: Low   GOALS: Goals reviewed with patient? Yes  SHORT TERM GOALS: Target date: 12/27/2021  Patient will be independent with HEP in order to improve functional outcomes. Baseline:  Goal status: IN  PROGRESS  2.  Patient will report at least 25% improvement in symptoms for improved quality of life. Baseline:  Goal status: IN PROGRESS   LONG TERM GOALS: Target date: 01/17/2022  Patient will report at least 75% improvement in symptoms for improved quality of life. Baseline:  Goal status: IN PROGRESS  2.  Patient will improve FOTO score to expected outcomes in order to indicate improved tolerance to activity. Baseline: complete next session Goal status: IN PROGRESS  3.  Patient will demonstrate at least 25% improvement in lumbar ROM in all restricted planes for improved ability to move trunk while completing chores. Baseline: see AROM Goal status: IN PROGRESS  4.  Patient will be able to ambulate at least 325 feet in in order to demonstrate improved tolerance to activity. Baseline: 265 feet Goal status: IN PROGRESS  5.  Patient will demonstrate grade of 4+/5 MMT grade in all tested musculature as evidence of improved strength to assist with stair ambulation and gait.   Baseline: see MMT Goal status: IN PROGRESS    PLAN: PT FREQUENCY: 2x/week  PT DURATION: 6 weeks  PLANNED INTERVENTIONS: Therapeutic exercises, Therapeutic activity, Neuromuscular re-education, Balance training, Gait training, Patient/Family education, Joint manipulation, Joint mobilization, Stair training, Orthotic/Fit training, DME instructions, Aquatic Therapy, Dry Needling, Electrical stimulation, Spinal manipulation, Spinal mobilization, Cryotherapy, Moist heat, Compression bandaging, scar mobilization, Splintting, Taping, Traction, Ultrasound, Ionotophoresis 4mg /ml Dexamethasone, and Manual therapy  PLAN FOR NEXT SESSION:  Continue with extension bias as patient stating improvement with most extension. Core and glute strength, lumbar mobility.  , PTA/CLT [facility] Ph: [phone]  [phone], PTA 01/12/2022, 1:55 PM

## 2022-01-16 ENCOUNTER — Encounter: Payer: Self-pay | Admitting: Physician Assistant

## 2022-01-16 ENCOUNTER — Ambulatory Visit (INDEPENDENT_AMBULATORY_CARE_PROVIDER_SITE_OTHER): Payer: Medicare Other | Admitting: Physician Assistant

## 2022-01-16 VITALS — BP 160/84 | HR 80 | Temp 98.2°F | Ht 62.0 in | Wt 112.0 lb

## 2022-01-16 DIAGNOSIS — R7309 Other abnormal glucose: Secondary | ICD-10-CM

## 2022-01-16 DIAGNOSIS — R202 Paresthesia of skin: Secondary | ICD-10-CM

## 2022-01-16 DIAGNOSIS — E782 Mixed hyperlipidemia: Secondary | ICD-10-CM

## 2022-01-16 DIAGNOSIS — I10 Essential (primary) hypertension: Secondary | ICD-10-CM | POA: Diagnosis not present

## 2022-01-16 DIAGNOSIS — R5383 Other fatigue: Secondary | ICD-10-CM | POA: Diagnosis not present

## 2022-01-16 LAB — CBC WITH DIFFERENTIAL/PLATELET
Basophils Absolute: 0 10*3/uL (ref 0.0–0.1)
Basophils Relative: 0.6 % (ref 0.0–3.0)
Eosinophils Absolute: 0.1 10*3/uL (ref 0.0–0.7)
Eosinophils Relative: 1.6 % (ref 0.0–5.0)
HCT: 38.3 % (ref 36.0–46.0)
Hemoglobin: 13.1 g/dL (ref 12.0–15.0)
Lymphocytes Relative: 28.8 % (ref 12.0–46.0)
Lymphs Abs: 2.1 10*3/uL (ref 0.7–4.0)
MCHC: 34.3 g/dL (ref 30.0–36.0)
MCV: 79.2 fl (ref 78.0–100.0)
Monocytes Absolute: 0.4 10*3/uL (ref 0.1–1.0)
Monocytes Relative: 5.8 % (ref 3.0–12.0)
Neutro Abs: 4.7 10*3/uL (ref 1.4–7.7)
Neutrophils Relative %: 63.2 % (ref 43.0–77.0)
Platelets: 213 10*3/uL (ref 150.0–400.0)
RBC: 4.84 Mil/uL (ref 3.87–5.11)
RDW: 15 % (ref 11.5–15.5)
WBC: 7.4 10*3/uL (ref 4.0–10.5)

## 2022-01-16 LAB — HEMOGLOBIN A1C: Hgb A1c MFr Bld: 6.1 % (ref 4.6–6.5)

## 2022-01-16 LAB — TSH: TSH: 3.97 u[IU]/mL (ref 0.35–5.50)

## 2022-01-16 NOTE — Patient Instructions (Addendum)
It was great to see you!  Your blood pressure goal is 150/90 or lower Keep checking your blood pressure about two days per week Continue amlodopine 5 mg  Follow-up if blood pressures are consistently >150/90  Weigh self monthly  Increase gabapentin to 200 mg nightly to see if this helps with your pain and your sleep  Let's follow-up in 3-6 months, sooner if you have concerns.  Take care,  Jarold Motto PA-C

## 2022-01-17 ENCOUNTER — Ambulatory Visit (HOSPITAL_COMMUNITY): Payer: Medicare Other | Admitting: Physical Therapy

## 2022-01-17 DIAGNOSIS — M5459 Other low back pain: Secondary | ICD-10-CM

## 2022-01-17 DIAGNOSIS — R29898 Other symptoms and signs involving the musculoskeletal system: Secondary | ICD-10-CM

## 2022-01-17 DIAGNOSIS — M6281 Muscle weakness (generalized): Secondary | ICD-10-CM | POA: Diagnosis not present

## 2022-01-17 DIAGNOSIS — R2689 Other abnormalities of gait and mobility: Secondary | ICD-10-CM

## 2022-01-17 LAB — COMPREHENSIVE METABOLIC PANEL
ALT: 15 U/L (ref 0–35)
AST: 23 U/L (ref 0–37)
Albumin: 4.7 g/dL (ref 3.5–5.2)
Alkaline Phosphatase: 89 U/L (ref 39–117)
BUN: 18 mg/dL (ref 6–23)
CO2: 24 mEq/L (ref 19–32)
Calcium: 9.8 mg/dL (ref 8.4–10.5)
Chloride: 101 mEq/L (ref 96–112)
Creatinine, Ser: 0.59 mg/dL (ref 0.40–1.20)
GFR: 84.16 mL/min (ref >60.00)
Glucose, Bld: 89 mg/dL (ref 70–99)
Potassium: 4.6 mEq/L (ref 3.5–5.1)
Sodium: 139 mEq/L (ref 135–145)
Total Bilirubin: 0.5 mg/dL (ref 0.2–1.2)
Total Protein: 8.2 g/dL (ref 6.0–8.3)

## 2022-01-17 LAB — LIPID PANEL
Cholesterol: 207 mg/dL — ABNORMAL HIGH (ref 0–200)
HDL: 54.3 mg/dL (ref >39.00)
NonHDL: 152.45
Total CHOL/HDL Ratio: 4
Triglycerides: 248 mg/dL — ABNORMAL HIGH (ref 0.0–149.0)
VLDL: 49.6 mg/dL — ABNORMAL HIGH (ref 0.0–40.0)

## 2022-01-17 LAB — LDL CHOLESTEROL, DIRECT: Direct LDL: 123 mg/dL

## 2022-01-17 NOTE — Therapy (Signed)
OUTPATIENT PHYSICAL THERAPY THORACOLUMBAR treatment/progress note.   Progress Note Reporting Period 12/06/21 to 01/17/22  See note below for Objective Data and Assessment of Progress/Goals.      Patient Name: Cassidy Bennett MRN: [id number] DOB:Mar 20, 1940, 81 y.o., female Today's Date: 01/17/2022    PT End of Session - 01/17/22 1429     Visit Number 10    Number of Visits 18    Date for PT Re-Evaluation 02/21/22    Authorization Type Primary Medicare Secondary BCBS    Progress Note Due on Visit 18    PT Start Time 1350    PT Stop Time 1430    PT Time Calculation (min) 40 min    Activity Tolerance Patient tolerated treatment well    Behavior During Therapy WFL for tasks assessed/performed                        Past Medical History:  Diagnosis Date   Allergy    Complication of anesthesia    History of TB (tuberculosis)    treated 25 plus years ago last cxr 03-15-18  CXR every 5 years   Hypercholesteremia    denies at preop   Hypertension    Osteoarthritis    Osteoporosis    PONV (postoperative nausea and vomiting)    Post-menopausal    Tuberculosis    Past Surgical History:  Procedure Laterality Date   CATARACT EXTRACTION  2018   right eye   COLONOSCOPY  2015   DILATION AND CURETTAGE OF UTERUS  2000   KNEE ARTHROSCOPY  2014   left   LUMBAR DISC SURGERY     SHOULDER SURGERY     right   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   Procedure: TOTAL KNEE ARTHROPLASTY;  Surgeon: Gaynelle Arabian, MD;  Location: [facility];  Service: Orthopedics;  Laterality: Left;   Patient Active Problem List   Diagnosis Date Noted   Osteoarthritis of left knee 08/12/2018   Closed displaced fracture of proximal phalanx of right great toe 01/03/2017   Rotator cuff syndrome of right shoulder 01/03/2017   Hyperlipidemia 09/05/2016   Essential hypertension 08/27/2015   Urine abnormality 03/29/2015   Routine general medical examination at a health care facility 06/26/2013    History of TB (tuberculosis) 06/17/2012   Seasonal allergies 01/22/2008   Osteoporosis 12/13/2006   OA (osteoarthritis) of knee 10/11/2006    PCP: Inda Coke PA  REFERRING PROVIDER: Alda Berthold, DO  Next apt 03/14/21  REFERRING DIAG: M54.17 (ICD-10-CM) - Lumbosacral radiculopathy M54.41,G89.29 (ICD-10-CM) - Chronic right-sided low back pain with right-sided sciatica   Rationale for Evaluation and Treatment Rehabilitation  THERAPY DIAG:  Muscle weakness (generalized)  Other abnormalities of gait and mobility  Other symptoms and signs involving the musculoskeletal system  Other low back pain  ONSET DATE: January 2023  SUBJECTIVE:  SUBJECTIVE STATEMENT:  Pt states that she is about 50% better since starting therapy.  She can not walk or stand for a  long time.  Typically mornings are worse for her  PERTINENT HISTORY:  Hx L TKA, HTN, osteoporosis, HLD, hx TB  PAIN:  Are you having pain? Yes: NPRS scale: 3/10 Pain location: low back Pain description: sore Aggravating factors: sleeping, sitting Relieving factors: meds, movement  PATIENT GOALS feel better   OBJECTIVE:   DIAGNOSTIC FINDINGS:  XR 08/13/20 IMPRESSION: 1. Prominent spondylosis and facet hypertrophy at the lumbosacral junction. No acute bony abnormality.  PATIENT SURVEYS:  Foto :  40 01/17/22:  53   POSTURE: rounded shoulders, forward head, decreased lumbar lordosis, and decreased thoracic kyphosis  PALPATION: Grossly hypomobile thoracic and lumbar spine, tender throughout thoracic/lumbar CPA; TTP lower lumbar paraspinals and L glute max  LUMBAR ROM:   Active  A/PROM  eval 01/17/22  Flexion 0% limited *   Extension 75% limited - felt good 60% limited   Right lateral flexion 75% limited * 20% limited   Left  lateral flexion 75% limited * 50% limited   Right rotation 50% limited * 20% limited  Left rotation 50% limited * 20% limited    (Blank rows = not tested) *= pain   LOWER EXTREMITY MMT:    MMT Right eval 11/21/ Left eval 11/21  Hip flexion 4- 5 4- 5  Hip extension 3+ _0 Hip abduction 4- 5 3+ 4+  Hip adduction      Hip internal rotation      Hip external rotation      Knee flexion 4 4+   4 4-  Knee extension 4 5 4+ 5  Ankle dorsiflexion 4- 5 4+ 4+  Ankle plantarflexion      Ankle inversion      Ankle eversion       (Blank rows = not tested)   FUNCTIONAL TESTS:  5 times sit to stand: 12.30 seconds without UE support;   11/21: 7.33  2 minute walk test: 265 feet ;  11/21:  483 ft.   GAIT: Distance walked: 265 feet Assistive device utilized: None Level of assistance: Complete Independence Comments: 2MWT, slight trunk flexion and R shift, slower cadence with gradually increasing LBP and LLE symptoms            Palpation:  Pt RT paraspinal mm are very tight but muscles relax with manual.   TODAY'S TREATMENT  01/17/22:  Reassessment Prone  Prone on elbow x 2 minutes with deep breathing  Press up x 10  Prone opposite arm/leg raise x 10  Sitting: Sit to stand x 10  01/12/22 Standing:3D hip excursions 10X each  Wall arch with heel raises 2X10  Hip extension 2X10  Hip abduction 2X10  Postural GTB exercise: scap retractions, rows, extensions 2X10  Vector stance 10X5" each LE with 1 UE assist Seated:  hamstring 3X30" each  Piriformis stretch 3X30" each Gait with increased stride, heel to toe gait  01/10/22 Prone: POE x 2 min 2 sets- cueing to relax gluteal mm Hip extension 10x 3" Standing: Side glides front of mirror for feedback  3D hip excursion 10x (limited extension noted) Wall arch with heel raise 10x 3" Prone: child's pose 3x 30" limited Lt knee ROM, 2 yr post-op   PATIENT EDUCATION:  Education details: Patient educated on exam findings, POC, scope  of PT, HEP, and continuing to exercise. Person educated: Patient Education method: Explanation, Demonstration,  and Handouts Education comprehension: verbalized understanding, returned demonstration, verbal cues required, and tactile cues required   HOME EXERCISE PROGRAM: Access Code: [email or web address] URL: [email or web address] Date: 01/12/2022 Prepared by: Roseanne Reno Exercises - Seated Piriformis Stretch with Trunk Bend  - 2 x daily - 7 x weekly - 1 sets - 3 reps - 30 sec hold - Seated Hamstring Stretch  - 2 x daily - 7 x weekly - 1 sets - 3 reps - 30 sec hold  01/10/22: vector stance               12/27/21              - Standing Row with Resistance with Anchored Resistance at Chest Height Palms Down  - 1 x daily - 7 x weekly - 1 sets - 10 reps - 3-5" hold - Standing Row with Anchored Resistance  - 1 x daily - 7 x weekly - 1 sets - 10 reps - 3-5"  hold - Single Arm Shoulder Extension with Anchored Resistance  - 1 x daily - 7 x weekly - 1 sets - 10 reps - 3-5"  hold  Access Code: [email or web address] 12/21/21 - Supine Transversus Abdominis Bracing - Hands on Stomach  - 2 x daily - 7 x weekly - 1 sets - 10 reps - 5 second hold - Supine March  - 2 x daily - 7 x weekly - 2 sets - 10 reps - Small Range Straight Leg Raise  - 2 x daily - 7 x weekly - 2 sets - 10 reps  12/15/21 - Prone Gluteal Sets  - 2 x daily - 7 x weekly - 1 sets - 10 reps - Prone Heel Squeeze  - 2 x daily - 7 x weekly - 1 sets - 10 reps - 5" hold - Supine Single Knee to Chest Stretch  - 2 x daily - 7 x weekly - 1 sets - 10 reps - 20" hold - Supine Hamstring Stretch  - 2 x daily - 7 x weekly - 1 sets - 10 reps - 20" hold Date: 12/06/2021 - Supine Bridge  - 2-3 x daily - 7 x weekly - 2-3 sets - 10 reps - Clamshell  - 2-3 x daily - 7 x weekly - 2-3 sets - 10 reps - Prone Press Up On Elbows  - 2-3 x daily - 7 x weekly - 5 reps - 20 second hold - Standing Lumbar Extension  - 2-3 x daily - 7 x weekly - 2 sets - 10  reps  ASSESSMENT:  CLINICAL IMPRESSION: Pt reassessed.  She has improved significantly but continues to have posture, ROM and strength deficits. Pt will continue to benefit from skilled therapy to address symptoms and improve overall function.  Therapist recommends extending therapy for four more weeks to work on current deficits.    OBJECTIVE IMPAIRMENTS Abnormal gait, decreased activity tolerance, decreased balance, decreased endurance, decreased mobility, difficulty walking, decreased ROM, decreased strength, increased muscle spasms, impaired flexibility, improper body mechanics, postural dysfunction, and pain.   ACTIVITY LIMITATIONS carrying, lifting, bending, standing, squatting, stairs, transfers, locomotion level, and caring for others  PARTICIPATION LIMITATIONS: meal prep, cleaning, laundry, shopping, community activity, and yard work  PERSONAL FACTORS Age, Time since onset of injury/illness/exacerbation, and 3+ comorbidities: Hx L TKA, HTN, osteoporosis, HLD, hx TB  are also affecting patient's functional outcome.   REHAB POTENTIAL: Good  CLINICAL DECISION MAKING: Stable/uncomplicated  EVALUATION COMPLEXITY: Low   GOALS: Goals reviewed with patient? Yes  SHORT TERM GOALS: Target date: 12/27/2021  Patient will be independent with HEP in order to improve functional outcomes. Baseline:  Goal status: MET  2.  Patient will report at least 25% improvement in symptoms for improved quality of life. Baseline:  Goal status: MET   LONG TERM GOALS: Target date: 01/17/2022  Patient will report at least 75% improvement in symptoms for improved quality of life. Baseline:  Goal status: IN PROGRESS  2.  Patient will improve FOTO score to expected outcomes in order to indicate improved tolerance to activity. Baseline: complete next session Goal status: MET  3.  Patient will demonstrate at least 25% improvement in lumbar ROM in all restricted planes for improved ability to move  trunk while completing chores. Baseline: see AROM Goal status: IN PROGRESS  4.  Patient will be able to ambulate at least 325 feet in 2MWT in order to demonstrate improved tolerance to activity. Baseline: 265 feet Goal status: MET  5.  Patient will demonstrate grade of 4+/5 MMT grade in all tested musculature as evidence of improved strength to assist with stair ambulation and gait.   Baseline: see MMT Goal status: IN PROGRESS    PLAN: PT FREQUENCY: 2x/week  PT DURATION: 6 weeks a total of 9 weeks treatment   PLANNED INTERVENTIONS: Therapeutic exercises, Therapeutic activity, Neuromuscular re-education, Balance training, Gait training, Patient/Family education, Joint manipulation, Joint mobilization, Stair training, Orthotic/Fit training, DME instructions, Aquatic Therapy, Dry Needling, Electrical stimulation, Spinal manipulation, Spinal mobilization, Cryotherapy, Moist heat, Compression bandaging, scar mobilization, Splintting, Taping, Traction, Ultrasound, Ionotophoresis 104m/ml Dexamethasone, and Manual therapy  PLAN FOR NEXT SESSION:  Continue with extension bias as patient stating improvement with most extension. Core and glute strength, lumbar mobility.  CRayetta Humphrey PT CLT [phone] 01/17/2022, 2:31 PM

## 2022-01-24 ENCOUNTER — Ambulatory Visit (HOSPITAL_COMMUNITY): Payer: Medicare Other | Admitting: Physical Therapy

## 2022-01-24 DIAGNOSIS — R2689 Other abnormalities of gait and mobility: Secondary | ICD-10-CM

## 2022-01-24 DIAGNOSIS — M5459 Other low back pain: Secondary | ICD-10-CM

## 2022-01-24 DIAGNOSIS — M6281 Muscle weakness (generalized): Secondary | ICD-10-CM

## 2022-01-24 DIAGNOSIS — R29898 Other symptoms and signs involving the musculoskeletal system: Secondary | ICD-10-CM | POA: Diagnosis not present

## 2022-01-24 NOTE — Therapy (Signed)
OUTPATIENT PHYSICAL THERAPY THORACOLUMBAR treatment   Patient Name: Cassidy Bennett MRN: [id number] DOB:10-27-1940, 81 y.o., female Today's Date: 01/24/2022    PT End of Session - 01/24/22 1441     Visit Number 11    Number of Visits 18    Date for PT Re-Evaluation 02/21/22    Authorization Type Primary Medicare Secondary BCBS    Progress Note Due on Visit 18    PT Start Time 1351    PT Stop Time 1435    PT Time Calculation (min) 44 min    Activity Tolerance Patient tolerated treatment well    Behavior During Therapy WFL for tasks assessed/performed                        Past Medical History:  Diagnosis Date   Allergy    Complication of anesthesia    History of TB (tuberculosis)    treated 75 plus years ago last cxr 03-15-18  CXR every 5 years   Hypercholesteremia    denies at preop   Hypertension    Osteoarthritis    Osteoporosis    PONV (postoperative nausea and vomiting)    Post-menopausal    Tuberculosis    Past Surgical History:  Procedure Laterality Date   CATARACT EXTRACTION  2018   right eye   COLONOSCOPY  2015   DILATION AND CURETTAGE OF UTERUS  2000   KNEE ARTHROSCOPY  2014   left   LUMBAR DISC SURGERY     SHOULDER SURGERY     right   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   Procedure: TOTAL KNEE ARTHROPLASTY;  Surgeon: Gaynelle Arabian, MD;  Location: [facility];  Service: Orthopedics;  Laterality: Left;   Patient Active Problem List   Diagnosis Date Noted   Osteoarthritis of left knee 08/12/2018   Closed displaced fracture of proximal phalanx of right great toe 01/03/2017   Rotator cuff syndrome of right shoulder 01/03/2017   Hyperlipidemia 09/05/2016   Essential hypertension 08/27/2015   Urine abnormality 03/29/2015   Routine general medical examination at a health care facility 06/26/2013   History of TB (tuberculosis) 06/17/2012   Seasonal allergies 01/22/2008   Osteoporosis 12/13/2006   OA (osteoarthritis) of knee 10/11/2006     PCP: Inda Coke PA  REFERRING PROVIDER: Alda Berthold, DO  Next apt 03/14/21  REFERRING DIAG: M54.17 (ICD-10-CM) - Lumbosacral radiculopathy M54.41,G89.29 (ICD-10-CM) - Chronic right-sided low back pain with right-sided sciatica   Rationale for Evaluation and Treatment Rehabilitation  THERAPY DIAG:  Muscle weakness (generalized)  Other abnormalities of gait and mobility  Other symptoms and signs involving the musculoskeletal system  Other low back pain  ONSET DATE: January 2023  SUBJECTIVE:  SUBJECTIVE STATEMENT:  .Pt states that she is not having any pain at this time.  PERTINENT HISTORY:  Hx L TKA, HTN, osteoporosis, HLD, hx TB  PAIN:  Are you having pain? Yes: NPRS scale: 3/10 Pain location: low back Pain description: sore Aggravating factors: sleeping, sitting Relieving factors: meds, movement  PATIENT GOALS feel better   OBJECTIVE:   DIAGNOSTIC FINDINGS:  XR 08/13/20 IMPRESSION: 1. Prominent spondylosis and facet hypertrophy at the lumbosacral junction. No acute bony abnormality.  PATIENT SURVEYS:  Foto :  40 01/17/22:  53   POSTURE: rounded shoulders, forward head, decreased lumbar lordosis, and decreased thoracic kyphosis  PALPATION: Grossly hypomobile thoracic and lumbar spine, tender throughout thoracic/lumbar CPA; TTP lower lumbar paraspinals and L glute max  LUMBAR ROM:   Active  A/PROM  eval 01/17/22  Flexion 0% limited *   Extension 75% limited - felt good 60% limited   Right lateral flexion 75% limited * 20% limited   Left lateral flexion 75% limited * 50% limited   Right rotation 50% limited * 20% limited  Left rotation 50% limited * 20% limited    (Blank rows = not tested) *= pain   LOWER EXTREMITY MMT:    MMT Right eval 11/21/ Left eval  11/21  Hip flexion 4- 5 4- 5  Hip extension 3+ _0 Hip abduction 4- 5 3+ 4+  Hip adduction      Hip internal rotation      Hip external rotation      Knee flexion 4 4+   4 4-  Knee extension 4 5 4+ 5  Ankle dorsiflexion 4- 5 4+ 4+  Ankle plantarflexion      Ankle inversion      Ankle eversion       (Blank rows = not tested)   FUNCTIONAL TESTS:  5 times sit to stand: 12.30 seconds without UE support;   11/21: 7.33  2 minute walk test: 265 feet ;  11/21:  483 ft.   GAIT: Distance walked: 265 feet Assistive device utilized: None Level of assistance: Complete Independence Comments: 2MWT, slight trunk flexion and R shift, slower cadence with gradually increasing LBP and LLE symptoms            Palpation:  Pt RT paraspinal mm are very tight but muscles relax with manual.   TODAY'S TREATMENT  01/24/22            Wall arch x 10            Side stepping with blue theraband x 2 RT            Standing hip abduction with blue theraband x 10 B             Standing hip extension with blue theraband x 10 B            Wall push up x 10             Pallof x 10 B             Wall slide x 10            T band postural exercises:            Scapular retraction x 10            Row x 10            Shoulder extension x 10  Supine:            Knee to chest x 10            Active hamstring stretch 3  x 30"             Quadriped             Opposite arm/leg raise x 10              Sit to stand x 15               3 D excursion x 3  01/17/22:  Reassessment Prone  Prone on elbow x 2 minutes with deep breathing  Press up x 10  Prone opposite arm/leg raise x 10  Sitting: Sit to stand x 10  01/12/22 Standing:3D hip excursions 10X each  Wall arch with heel raises 2X10  Hip extension 2X10  Hip abduction 2X10  Postural GTB exercise: scap retractions, rows, extensions 2X10  Vector stance 10X5" each LE with 1 UE assist Seated:  hamstring 3X30" each  Piriformis stretch  3X30" each Gait with increased stride, heel to toe gait  01/10/22 Prone: POE x 2 min 2 sets- cueing to relax gluteal mm Hip extension 10x 3" Standing: Side glides front of mirror for feedback  3D hip excursion 10x (limited extension noted) Wall arch with heel raise 10x 3" Prone: child's pose 3x 30" limited Lt knee ROM, 2 yr post-op   PATIENT EDUCATION:  Education details: Patient educated on exam findings, POC, scope of PT, HEP, and continuing to exercise. Person educated: Patient Education method: Explanation, Demonstration, and Handouts Education comprehension: verbalized understanding, returned demonstration, verbal cues required, and tactile cues required   HOME EXERCISE PROGRAM: 01/24/22 - Side Stepping with Resistance at Thighs and Counter Support  - 1 x daily - 7 x weekly - 1 sets - 10 reps - Squatting Anti-Rotation Press  - 1 x daily - 7 x weekly - 1 sets - 10 reps - 5" hold - Standing Hip Extension with Resistance at Ankles and Counter Support  - 1 x daily - 7 x weekly - 1 sets - 10 reps - 5" hold - Diagonal Hip Extension with Resistance  - 1 x daily - 7 x weekly - 1 sets - 10 reps - 5" hold Access Code: [email or web address] URL: [email or web address] Date: 01/12/2022 Prepared by: Roseanne Reno Exercises - Seated Piriformis Stretch with Trunk Bend  - 2 x daily - 7 x weekly - 1 sets - 3 reps - 30 sec hold - Seated Hamstring Stretch  - 2 x daily - 7 x weekly - 1 sets - 3 reps - 30 sec hold  01/10/22: vector stance               12/27/21              - Standing Row with Resistance with Anchored Resistance at Chest Height Palms Down  - 1 x daily - 7 x weekly - 1 sets - 10 reps - 3-5" hold - Standing Row with Anchored Resistance  - 1 x daily - 7 x weekly - 1 sets - 10 reps - 3-5"  hold - Single Arm Shoulder Extension with Anchored Resistance  - 1 x daily - 7 x weekly - 1 sets - 10 reps - 3-5"  hold  Access Code: [email or web address] 12/21/21 - Supine Transversus Abdominis Bracing -  Hands on Stomach  - 2 x daily - 7 x weekly - 1  sets - 10 reps - 5 second hold - Supine March  - 2 x daily - 7 x weekly - 2 sets - 10 reps - Small Range Straight Leg Raise  - 2 x daily - 7 x weekly - 2 sets - 10 reps  12/15/21 - Prone Gluteal Sets  - 2 x daily - 7 x weekly - 1 sets - 10 reps - Prone Heel Squeeze  - 2 x daily - 7 x weekly - 1 sets - 10 reps - 5" hold - Supine Single Knee to Chest Stretch  - 2 x daily - 7 x weekly - 1 sets - 10 reps - 20" hold - Supine Hamstring Stretch  - 2 x daily - 7 x weekly - 1 sets - 10 reps - 20" hold Date: 12/06/2021 - Supine Bridge  - 2-3 x daily - 7 x weekly - 2-3 sets - 10 reps - Clamshell  - 2-3 x daily - 7 x weekly - 2-3 sets - 10 reps - Prone Press Up On Elbows  - 2-3 x daily - 7 x weekly - 5 reps - 20 second hold - Standing Lumbar Extension  - 2-3 x daily - 7 x weekly - 2 sets - 10 reps  ASSESSMENT:  CLINICAL IMPRESSION: Therapist added new exercises to attempt to strengthen hip and core mm; therapist updated pt HEP to include new exercises today.  PT continues to need manual assist to keep lumbar stabilized with quadriped exercises.  Therapist recommends extending therapy for four more weeks to work on current deficits.    OBJECTIVE IMPAIRMENTS Abnormal gait, decreased activity tolerance, decreased balance, decreased endurance, decreased mobility, difficulty walking, decreased ROM, decreased strength, increased muscle spasms, impaired flexibility, improper body mechanics, postural dysfunction, and pain.   ACTIVITY LIMITATIONS carrying, lifting, bending, standing, squatting, stairs, transfers, locomotion level, and caring for others  PARTICIPATION LIMITATIONS: meal prep, cleaning, laundry, shopping, community activity, and yard work  PERSONAL FACTORS Age, Time since onset of injury/illness/exacerbation, and 3+ comorbidities: Hx L TKA, HTN, osteoporosis, HLD, hx TB  are also affecting patient's functional outcome.   REHAB POTENTIAL:  Good  CLINICAL DECISION MAKING: Stable/uncomplicated  EVALUATION COMPLEXITY: Low   GOALS: Goals reviewed with patient? Yes  SHORT TERM GOALS: Target date: 12/27/2021  Patient will be independent with HEP in order to improve functional outcomes. Baseline:  Goal status: MET  2.  Patient will report at least 25% improvement in symptoms for improved quality of life. Baseline:  Goal status: MET   LONG TERM GOALS: Target date: 01/17/2022  Patient will report at least 75% improvement in symptoms for improved quality of life. Baseline:  Goal status: IN PROGRESS  2.  Patient will improve FOTO score to expected outcomes in order to indicate improved tolerance to activity. Baseline: complete next session Goal status: MET  3.  Patient will demonstrate at least 25% improvement in lumbar ROM in all restricted planes for improved ability to move trunk while completing chores. Baseline: see AROM Goal status: IN PROGRESS  4.  Patient will be able to ambulate at least 325 feet in 2MWT in order to demonstrate improved tolerance to activity. Baseline: 265 feet Goal status: MET  5.  Patient will demonstrate grade of 4+/5 MMT grade in all tested musculature as evidence of improved strength to assist with stair ambulation and gait.   Baseline: see MMT Goal status: IN PROGRESS    PLAN: PT FREQUENCY: 2x/week  PT DURATION: 6 weeks a total of 9 weeks  treatment   PLANNED INTERVENTIONS: Therapeutic exercises, Therapeutic activity, Neuromuscular re-education, Balance training, Gait training, Patient/Family education, Joint manipulation, Joint mobilization, Stair training, Orthotic/Fit training, DME instructions, Aquatic Therapy, Dry Needling, Electrical stimulation, Spinal manipulation, Spinal mobilization, Cryotherapy, Moist heat, Compression bandaging, scar mobilization, Splintting, Taping, Traction, Ultrasound, Ionotophoresis 88m/ml Dexamethasone, and Manual therapy  PLAN FOR NEXT SESSION:   Continue with extension bias as patient stating improvement with most extension. Core and glute strength, lumbar mobility.  CRayetta Humphrey PT CLT [phone] 01/24/2022, 14:41 PM

## 2022-01-26 ENCOUNTER — Encounter (HOSPITAL_COMMUNITY): Payer: Medicare Other | Admitting: Physical Therapy

## 2022-01-27 ENCOUNTER — Ambulatory Visit (HOSPITAL_COMMUNITY): Payer: Medicare Other | Attending: Neurology | Admitting: Physical Therapy

## 2022-01-27 DIAGNOSIS — M5459 Other low back pain: Secondary | ICD-10-CM | POA: Diagnosis not present

## 2022-01-27 DIAGNOSIS — R2689 Other abnormalities of gait and mobility: Secondary | ICD-10-CM | POA: Diagnosis not present

## 2022-01-27 DIAGNOSIS — R29898 Other symptoms and signs involving the musculoskeletal system: Secondary | ICD-10-CM | POA: Diagnosis not present

## 2022-01-27 DIAGNOSIS — M6281 Muscle weakness (generalized): Secondary | ICD-10-CM | POA: Diagnosis not present

## 2022-01-27 NOTE — Therapy (Signed)
OUTPATIENT PHYSICAL THERAPY THORACOLUMBAR treatment   Patient Name: Cassidy Bennett MRN: [id number] DOB:25-Sep-1940, 81 y.o., female Today's Date: 01/27/2022  PT End of Session - 01/27/22 1644     Visit Number 12    Number of Visits 18    Date for PT Re-Evaluation 02/21/22    Authorization Type Primary Medicare Secondary BCBS    Progress Note Due on Visit 18    PT Start Time 1404    PT Stop Time 1442    PT Time Calculation (min) 38 min    Activity Tolerance Patient tolerated treatment well    Behavior During Therapy WFL for tasks assessed/performed                   Past Medical History:  Diagnosis Date   Allergy    Complication of anesthesia    History of TB (tuberculosis)    treated 49 plus years ago last cxr 03-15-18  CXR every 5 years   Hypercholesteremia    denies at preop   Hypertension    Osteoarthritis    Osteoporosis    PONV (postoperative nausea and vomiting)    Post-menopausal    Tuberculosis    Past Surgical History:  Procedure Laterality Date   CATARACT EXTRACTION  2018   right eye   COLONOSCOPY  2015   DILATION AND CURETTAGE OF UTERUS  2000   KNEE ARTHROSCOPY  2014   left   LUMBAR DISC SURGERY     SHOULDER SURGERY     right   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   Procedure: TOTAL KNEE ARTHROPLASTY;  Surgeon: Gaynelle Arabian, MD;  Location: [facility];  Service: Orthopedics;  Laterality: Left;   Patient Active Problem List   Diagnosis Date Noted   Osteoarthritis of left knee 08/12/2018   Closed displaced fracture of proximal phalanx of right great toe 01/03/2017   Rotator cuff syndrome of right shoulder 01/03/2017   Hyperlipidemia 09/05/2016   Essential hypertension 08/27/2015   Urine abnormality 03/29/2015   Routine general medical examination at a health care facility 06/26/2013   History of TB (tuberculosis) 06/17/2012   Seasonal allergies 01/22/2008   Osteoporosis 12/13/2006   OA (osteoarthritis) of knee 10/11/2006    PCP:  Inda Coke PA  REFERRING PROVIDER: Alda Berthold, DO  Next apt 03/14/21  REFERRING DIAG: M54.17 (ICD-10-CM) - Lumbosacral radiculopathy M54.41,G89.29 (ICD-10-CM) - Chronic right-sided low back pain with right-sided sciatica   Rationale for Evaluation and Treatment Rehabilitation  THERAPY DIAG:  Muscle weakness (generalized)  Other abnormalities of gait and mobility  Other symptoms and signs involving the musculoskeletal system  Other low back pain  ONSET DATE: January 2023  SUBJECTIVE:  SUBJECTIVE STATEMENT:  . Pt states that she has been putting up decorations all day so she is tired but no pain.  No questions on HEP   PERTINENT HISTORY:  Hx L TKA, HTN, osteoporosis, HLD, hx TB  PAIN:  Are you having pain? Yes: NPRS scale: 3/10 Pain location: low back Pain description: sore Aggravating factors: sleeping, sitting Relieving factors: meds, movement  PATIENT GOALS feel better   OBJECTIVE:   DIAGNOSTIC FINDINGS:  XR 08/13/20 IMPRESSION: 1. Prominent spondylosis and facet hypertrophy at the lumbosacral junction. No acute bony abnormality.  PATIENT SURVEYS:  Foto :  40 01/17/22:  53   POSTURE: rounded shoulders, forward head, decreased lumbar lordosis, and decreased thoracic kyphosis  PALPATION: Grossly hypomobile thoracic and lumbar spine, tender throughout thoracic/lumbar CPA; TTP lower lumbar paraspinals and L glute max  LUMBAR ROM:   Active  A/PROM  eval 01/17/22  Flexion 0% limited *   Extension 75% limited - felt good 60% limited   Right lateral flexion 75% limited * 20% limited   Left lateral flexion 75% limited * 50% limited   Right rotation 50% limited * 20% limited  Left rotation 50% limited * 20% limited    (Blank rows = not tested) *= pain   LOWER EXTREMITY  MMT:    MMT Right eval 11/21/ Left eval 11/21  Hip flexion 4- 5 4- 5  Hip extension 3+ _0 Hip abduction 4- 5 3+ 4+  Hip adduction      Hip internal rotation      Hip external rotation      Knee flexion 4 4+   4 4-  Knee extension 4 5 4+ 5  Ankle dorsiflexion 4- 5 4+ 4+  Ankle plantarflexion      Ankle inversion      Ankle eversion       (Blank rows = not tested)   FUNCTIONAL TESTS:  5 times sit to stand: 12.30 seconds without UE support;   11/21: 7.33  2 minute walk test: 265 feet ;  11/21:  483 ft.   GAIT: Distance walked: 265 feet Assistive device utilized: None Level of assistance: Complete Independence Comments: 2MWT, slight trunk flexion and R shift, slower cadence with gradually increasing LBP and LLE symptoms            Palpation:  Pt RT paraspinal mm are very tight but muscles relax with manual.   TODAY'S TREATMENT  01/27/22: Standing: Warrior I x 5 B Warrior II x 5 B Tree pose x 2 B Hip excursion x 3  Quadriped: Piriformis stretch 1 x 30" bothers Rt knee  Mad cat/old horse x 5 Assisted opposite arm/leg x 5 Supine : Piriformis stretch hold 20" x 5 B Bridge with adduction x 15 hold 5" Dead bug x 10   Manual to decrease tension and improve motion in low back  01/24/22            Wall arch x 10            Side stepping with blue theraband x 2 RT            Standing hip abduction with blue theraband x 10 B             Standing hip extension with blue theraband x 10 B            Wall push up x 10  Pallof x 10 B             Wall slide x 10            T band postural exercises:            Scapular retraction x 10            Row x 10            Shoulder extension x 10            Supine:            Knee to chest x 10            Active hamstring stretch 3  x 30"             Quadriped             Opposite arm/leg raise x 10              Sit to stand x 15               3 D excursion x 3  01/17/22:  Reassessment Prone  Prone on  elbow x 2 minutes with deep breathing  Press up x 10  Prone opposite arm/leg raise x 10  Sitting: Sit to stand x 10  01/12/22 Standing:3D hip excursions 10X each  Wall arch with heel raises 2X10  Hip extension 2X10  Hip abduction 2X10  Postural GTB exercise: scap retractions, rows, extensions 2X10  Vector stance 10X5" each LE with 1 UE assist Seated:  hamstring 3X30" each  Piriformis stretch 3X30" each Gait with increased stride, heel to toe gait  01/10/22 Prone: POE x 2 min 2 sets- cueing to relax gluteal mm Hip extension 10x 3" Standing: Side glides front of mirror for feedback  3D hip excursion 10x (limited extension noted) Wall arch with heel raise 10x 3" Prone: child's pose 3x 30" limited Lt knee ROM, 2 yr post-op   PATIENT EDUCATION:  Education details: Patient educated on exam findings, POC, scope of PT, HEP, and continuing to exercise. Person educated: Patient Education method: Explanation, Demonstration, and Handouts Education comprehension: verbalized understanding, returned demonstration, verbal cues required, and tactile cues required   HOME EXERCISE PROGRAM: 01/24/22 - Side Stepping with Resistance at Thighs and Counter Support  - 1 x daily - 7 x weekly - 1 sets - 10 reps - Squatting Anti-Rotation Press  - 1 x daily - 7 x weekly - 1 sets - 10 reps - 5" hold - Standing Hip Extension with Resistance at Ankles and Counter Support  - 1 x daily - 7 x weekly - 1 sets - 10 reps - 5" hold - Diagonal Hip Extension with Resistance  - 1 x daily - 7 x weekly - 1 sets - 10 reps - 5" hold Access Code: [email or web address] URL: [email or web address] Date: 01/12/2022 Prepared by: Roseanne Reno Exercises - Seated Piriformis Stretch with Trunk Bend  - 2 x daily - 7 x weekly - 1 sets - 3 reps - 30 sec hold - Seated Hamstring Stretch  - 2 x daily - 7 x weekly - 1 sets - 3 reps - 30 sec hold  01/10/22: vector stance               12/27/21              - Standing Row with  Resistance with Anchored Resistance at Chest Height Palms Down  - 1 x daily -  7 x weekly - 1 sets - 10 reps - 3-5" hold - Standing Row with Anchored Resistance  - 1 x daily - 7 x weekly - 1 sets - 10 reps - 3-5"  hold - Single Arm Shoulder Extension with Anchored Resistance  - 1 x daily - 7 x weekly - 1 sets - 10 reps - 3-5"  hold  Access Code: [email or web address] 12/21/21 - Supine Transversus Abdominis Bracing - Hands on Stomach  - 2 x daily - 7 x weekly - 1 sets - 10 reps - 5 second hold - Supine March  - 2 x daily - 7 x weekly - 2 sets - 10 reps - Small Range Straight Leg Raise  - 2 x daily - 7 x weekly - 2 sets - 10 reps  12/15/21 - Prone Gluteal Sets  - 2 x daily - 7 x weekly - 1 sets - 10 reps - Prone Heel Squeeze  - 2 x daily - 7 x weekly - 1 sets - 10 reps - 5" hold - Supine Single Knee to Chest Stretch  - 2 x daily - 7 x weekly - 1 sets - 10 reps - 20" hold - Supine Hamstring Stretch  - 2 x daily - 7 x weekly - 1 sets - 10 reps - 20" hold Date: 12/06/2021 - Supine Bridge  - 2-3 x daily - 7 x weekly - 2-3 sets - 10 reps - Clamshell  - 2-3 x daily - 7 x weekly - 2-3 sets - 10 reps - Prone Press Up On Elbows  - 2-3 x daily - 7 x weekly - 5 reps - 20 second hold - Standing Lumbar Extension  - 2-3 x daily - 7 x weekly - 2 sets - 10 reps  ASSESSMENT:  CLINICAL IMPRESSION: Therapist added yoga positions to work on core strength.  Noted tightness of hip ER with tree pose therefore attempted quadriped piriformis stretch but this was to painful on pt Rt knee therefore d/c to supine piriformis stretch.    Manual completed to decrease tension and improve ROM.  PT continues to need manual assist to keep lumbar stabilized with quadriped exercises.  Continue with core strengthening and stretching to improve function.  OBJECTIVE IMPAIRMENTS Abnormal gait, decreased activity tolerance, decreased balance, decreased endurance, decreased mobility, difficulty walking, decreased ROM, decreased strength, increased  muscle spasms, impaired flexibility, improper body mechanics, postural dysfunction, and pain.   ACTIVITY LIMITATIONS carrying, lifting, bending, standing, squatting, stairs, transfers, locomotion level, and caring for others  PARTICIPATION LIMITATIONS: meal prep, cleaning, laundry, shopping, community activity, and yard work  PERSONAL FACTORS Age, Time since onset of injury/illness/exacerbation, and 3+ comorbidities: Hx L TKA, HTN, osteoporosis, HLD, hx TB  are also affecting patient's functional outcome.   REHAB POTENTIAL: Good  CLINICAL DECISION MAKING: Stable/uncomplicated  EVALUATION COMPLEXITY: Low   GOALS: Goals reviewed with patient? Yes  SHORT TERM GOALS: Target date: 12/27/2021  Patient will be independent with HEP in order to improve functional outcomes. Baseline:  Goal status: MET  2.  Patient will report at least 25% improvement in symptoms for improved quality of life. Baseline:  Goal status: MET   LONG TERM GOALS: Target date: 01/17/2022  Patient will report at least 75% improvement in symptoms for improved quality of life. Baseline:  Goal status: IN PROGRESS  2.  Patient will improve FOTO score to expected outcomes in order to indicate improved tolerance to activity. Baseline: complete next session Goal status: MET  3.  Patient  will demonstrate at least 25% improvement in lumbar ROM in all restricted planes for improved ability to move trunk while completing chores. Baseline: see AROM Goal status: IN PROGRESS  4.  Patient will be able to ambulate at least 325 feet in 2MWT in order to demonstrate improved tolerance to activity. Baseline: 265 feet Goal status: MET  5.  Patient will demonstrate grade of 4+/5 MMT grade in all tested musculature as evidence of improved strength to assist with stair ambulation and gait.   Baseline: see MMT Goal status: IN PROGRESS    PLAN: PT FREQUENCY: 2x/week  PT DURATION: 6 weeks a total of 9 weeks treatment    PLANNED INTERVENTIONS: Therapeutic exercises, Therapeutic activity, Neuromuscular re-education, Balance training, Gait training, Patient/Family education, Joint manipulation, Joint mobilization, Stair training, Orthotic/Fit training, DME instructions, Aquatic Therapy, Dry Needling, Electrical stimulation, Spinal manipulation, Spinal mobilization, Cryotherapy, Moist heat, Compression bandaging, scar mobilization, Splintting, Taping, Traction, Ultrasound, Ionotophoresis 1m/ml Dexamethasone, and Manual therapy  PLAN FOR NEXT SESSION:  Continue with extension bias as patient stating improvement with most extension. Core and glute strength, lumbar mobility.  CRayetta Humphrey PCongressCLT [phone] 01/27/2022, [phone]

## 2022-01-31 ENCOUNTER — Encounter (HOSPITAL_COMMUNITY): Payer: Self-pay

## 2022-01-31 ENCOUNTER — Ambulatory Visit (INDEPENDENT_AMBULATORY_CARE_PROVIDER_SITE_OTHER): Payer: Medicare Other

## 2022-01-31 ENCOUNTER — Ambulatory Visit (HOSPITAL_COMMUNITY)
Admission: EM | Admit: 2022-01-31 | Discharge: 2022-01-31 | Disposition: A | Discharge status: Home / Self Care | Payer: Medicare Other | Attending: Internal Medicine | Admitting: Internal Medicine

## 2022-01-31 DIAGNOSIS — B303 Acute epidemic hemorrhagic conjunctivitis (enteroviral): Secondary | ICD-10-CM | POA: Diagnosis not present

## 2022-01-31 DIAGNOSIS — R011 Cardiac murmur, unspecified: Secondary | ICD-10-CM | POA: Diagnosis not present

## 2022-01-31 DIAGNOSIS — H539 Unspecified visual disturbance: Secondary | ICD-10-CM | POA: Diagnosis not present

## 2022-01-31 DIAGNOSIS — R0602 Shortness of breath: Secondary | ICD-10-CM | POA: Diagnosis not present

## 2022-01-31 DIAGNOSIS — I10 Essential (primary) hypertension: Secondary | ICD-10-CM | POA: Diagnosis not present

## 2022-01-31 DIAGNOSIS — H1131 Conjunctival hemorrhage, right eye: Secondary | ICD-10-CM | POA: Diagnosis not present

## 2022-01-31 NOTE — Discharge Instructions (Addendum)
Your eye doctor office said they will see you today at 1:30 pm, but unsure who can see you. Try to get there at 1:20 pm Your chest xray is normal and stable when compared to your last one The EKG shows possible right atrial enlargement, but no signs of heart attack. I will be best for you to have an echocardiogram to see why you have a heart murmur now since you haven't had one before.  Here are a couple of cardiology places you can call for appointment

## 2022-01-31 NOTE — ED Triage Notes (Addendum)
Pt presents with right eye redness x 4 days. Denies any injury. Reports blurred vision in right eye. Denies pain or any other symptoms. Reports she closed her left eye this morning and noticed the blurry vision/decreased vision in right eye.

## 2022-01-31 NOTE — ED Provider Notes (Signed)
[facility]    CSN: [id number] Arrival date & time: 01/31/22  0909      History   Chief Complaint Chief Complaint  Patient presents with   Eye Pain    HPI Cassidy Bennett is a 81 y.o. female presents with R eye redness  x 4 days ago after while at a meeting she drank a [place] tea, and when she was leaving the conference a coworker told her that her R eye was red, but she did not have any pain, vision changes, photophobia or tearing. She denies FB sensation or  injury. This am she was trying to read the news paper and Noticed when she closed her L eye as she was itching it, she could not read the letters with her R eye. The last time she saw her eye MD was 5 months ago, and everything was fine. She denies HA. Has noticed her BP was elevated this am and took her BP med at 4 am, since she was up due to her low back pain and took 2 advils. She denies tinnitus, CP, but has been SOB with exertion, and was given a spirometer after she had her L knee surgery 2 years ago. She also states she has to sleep with 2 pillows since with one she feels SOB when she lays down. This past weekend has had PND and mild cough. She has had 8 covid shots.  She tried to get into her eye MD and left messages, but has not heard back from them.     Past Medical History:  Diagnosis Date   Allergy    Complication of anesthesia    History of TB (tuberculosis)    treated 45 plus years ago last cxr 03-15-18  CXR every 5 years   Hypercholesteremia    denies at preop   Hypertension    Osteoarthritis    Osteoporosis    PONV (postoperative nausea and vomiting)    Post-menopausal    Tuberculosis     Patient Active Problem List   Diagnosis Date Noted   Osteoarthritis of left knee 08/12/2018   Closed displaced fracture of proximal phalanx of right great toe 01/03/2017   Rotator cuff syndrome of right shoulder 01/03/2017   Hyperlipidemia 09/05/2016   Essential hypertension 08/27/2015   Urine  abnormality 03/29/2015   Routine general medical examination at a health care facility 06/26/2013   History of TB (tuberculosis) 06/17/2012   Seasonal allergies 01/22/2008   Osteoporosis 12/13/2006   OA (osteoarthritis) of knee 10/11/2006    Past Surgical History:  Procedure Laterality Date   CATARACT EXTRACTION  2018   right eye   COLONOSCOPY  2015   DILATION AND CURETTAGE OF UTERUS  2000   KNEE ARTHROSCOPY  2014   left   LUMBAR DISC SURGERY     SHOULDER SURGERY     right   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   Procedure: TOTAL KNEE ARTHROPLASTY;  Surgeon: Ollen Gross, MD;  Location: [facility];  Service: Orthopedics;  Laterality: Left;    OB History   No obstetric history on file.      Home Medications    Prior to Admission medications   Medication Sig Start Date End Date Taking? Authorizing Provider  amLODipine (NORVASC) 5 MG tablet Take 1 tablet (5 mg total) by mouth daily. 10/14/21   Jarold Motto, PA  Ascorbic Acid (VITAMIN C PO) Take 1,000 mg by mouth daily.     [provider]  b  complex vitamins tablet Take 1 tablet by mouth daily. Every other day    [provider]  Calcium Carb-Cholecalciferol (CALCIUM 600 + D PO) Take 1 tablet by mouth daily.    [provider]  gabapentin (NEURONTIN) 100 MG capsule Take 1 tablet at bedtime x 1 week, then increase to 2 tablet at bedtime. Patient taking differently: Take 1 tablet at bedtime x 1 week, then increase to 2 tablet at bedtime. Takes prn 05/16/21   Nita Sickle K, DO  magnesium 30 MG tablet Take 30 mg by mouth 3 (three) times a week.    [provider]  polyethylene glycol (MIRALAX / GLYCOLAX) packet Take 17 g by mouth daily as needed for moderate constipation.     [provider]  tizanidine (ZANAFLEX) 2 MG capsule Take 1 capsule (2 mg total) by mouth at bedtime as needed for muscle spasms. 11/07/21   Glendale Chard, DO  Vitamin A 2400 MCG (8000 UT) TABS Take 8,000 Units  by mouth daily.     [provider]  vitamin B-12 (CYANOCOBALAMIN) 1000 MCG tablet Take 1,000 mcg by mouth daily.    [provider]  vitamin E 400 UNIT capsule Take 400 Units by mouth daily.    [provider]    Family History Family History  Problem Relation Age of Onset   Other Mother        malaria   Other Father    Cancer Other        stomach   Breast cancer Neg Hx     Social History Social History   Tobacco Use   Smoking status: Never   Smokeless tobacco: Never  Vaping Use   Vaping Use: Never used  Substance Use Topics   Alcohol use: Not Currently    Alcohol/week: 5.0 standard drinks of alcohol    Types: 5 Glasses of wine per week   Drug use: No     Allergies   Metoprolol, Morphine and related, and Other   Review of Systems Review of Systems  Constitutional:  Positive for fatigue. Negative for activity change, chills, diaphoresis and fever.  HENT:  Positive for postnasal drip. Negative for congestion, rhinorrhea and tinnitus.   Eyes:  Positive for redness and visual disturbance. Negative for photophobia, pain, discharge and itching.  Respiratory:  Positive for shortness of breath. Negative for chest tightness.        SOB with activity  Cardiovascular:  Negative for chest pain, palpitations and leg swelling.       Denies hx of heart murmur. Has been having uncontrolled BP and meds were changes this month. Denies ever having an echo or seeing a cardiologist.   Skin:  Negative for rash and wound.  Neurological:  Negative for headaches.     Physical Exam Triage Vital Signs ED Triage Vitals  Enc Vitals Group     BP 01/31/22 1021 (!) 167/84     Pulse Rate 01/31/22 1021 87     Resp 01/31/22 1021 18     Temp 01/31/22 1021 97.8 F (36.6 C)     Temp src --      SpO2 01/31/22 1021 99 %     Weight --      Height --      Head Circumference --      Peak Flow --      Pain Score 01/31/22 1018 0     Pain Loc --      Pain Edu? --  Excl. in GC? --    No data found.  Updated Vital Signs BP (!) 167/84   Pulse 87   Temp 97.8 F (36.6 C)   Resp 18   SpO2 99%   Visual Acuity Right Eye Distance: 20/50 Left Eye Distance: 20/40 Bilateral Distance: 20/40  Right Eye Near:   Left Eye Near:    Bilateral Near:     Physical Exam Vitals and nursing note reviewed.  Constitutional:      General: She is not in acute distress.    Appearance: She is normal weight. She is not ill-appearing, toxic-appearing or diaphoretic.  HENT:     Right Ear: External ear normal.     Left Ear: External ear normal.  Eyes:     General: No scleral icterus.       Right eye: No discharge.        Left eye: No discharge.     Extraocular Movements: Extraocular movements intact.     Pupils: Pupils are equal, round, and reactive to light.     Comments: R EYE- with conjunctiva hemorrhage on medial and lower sclera region. There is no photophobia or tearing present.   Cardiovascular:     Rate and Rhythm: Normal rate and regular rhythm.     Pulses: Normal pulses.     Heart sounds: Murmur heard.     Comments: IV/VI murmur Pulmonary:     Effort: Pulmonary effort is normal.     Breath sounds: Normal breath sounds.  Musculoskeletal:        General: Normal range of motion.     Cervical back: Neck supple.     Right lower leg: No edema.     Left lower leg: No edema.  Skin:    General: Skin is warm and dry.  Neurological:     Mental Status: She is alert and oriented to person, place, and time.     Gait: Gait normal.  Psychiatric:        Mood and Affect: Mood normal.        Behavior: Behavior normal.        Thought Content: Thought content normal.        Judgment: Judgment normal.      UC Treatments / Results  Labs (all labs ordered are listed, but only abnormal results are displayed) Labs Reviewed - No data to display  EKG NSR with R atrial enlargement. Borderline EKG  Radiology DG Chest 2 View  Result Date:  01/31/2022 CLINICAL DATA:  Shortness of breath with activity. Right eye erythema for 4 days. EXAM: CHEST - 2 VIEW COMPARISON:  Radiographs 03/15/2018 FINDINGS: The heart size and mediastinal contours are stable with aortic atherosclerosis. The lungs appear unchanged. There is no edema, confluent airspace opacity, pleural effusion or pneumothorax. Mild degenerative changes in the thoracic spine. IMPRESSION: Stable chest. No acute cardiopulmonary process. Aortic atherosclerosis. Electronically Signed   By: Carey BullocksWilliam  Veazey M.D.   On: 01/31/2022 11:46    Procedures Procedures (including critical care time)  Medications Ordered in UC Medications - No data to display  Initial Impression / Assessment and Plan / UC Course  I have reviewed the triage vital signs and the nursing notes.  Pertinent labs & imaging results that were available during my care of the patient were reviewed by me and considered in my medical decision making (see chart for details).  I spend 20 minutes looking over her records tryring to find echo results, and read the past 3 years  provider notes to see  if anyone had heard a murmur, but no record of a heart murmur was noted.   R hemorrhagic conjunctivitis with decreased vision Uncontrolled HTN Heart murmur- new finding  She will see her eye MD whom I called personally at 1:30 pm today She was advised to Fu with cardiology or call her PCP to get echo done.    Final Clinical Impressions(s) / UC Diagnoses  R hemorrhagic conjunctivitis with decreased vision Uncontrolled HTN Heart murmur- new finding Final diagnoses:  Acute hemorrhagic conjunctivitis of right eye  Change in vision  Heart murmur  Uncontrolled hypertension     Discharge Instructions      Your eye doctor office said they will see you today at 1:30 pm, but unsure who can see you. Try to get there at 1:20 pm Your chest xray is normal and stable when compared to your last one The EKG shows possible right  atrial enlargement, but no signs of heart attack. I will be best for you to have an echocardiogram to see why you have a heart murmur now since you haven't had one before.  Here are a couple of cardiology places you can call for appointment       ED Prescriptions   None    PDMP not reviewed this encounter.   Garey Ham, PA-C 01/31/22 1204

## 2022-02-01 ENCOUNTER — Ambulatory Visit (HOSPITAL_COMMUNITY): Payer: Medicare Other | Admitting: Physical Therapy

## 2022-02-01 DIAGNOSIS — M5459 Other low back pain: Secondary | ICD-10-CM | POA: Diagnosis not present

## 2022-02-01 DIAGNOSIS — M6281 Muscle weakness (generalized): Secondary | ICD-10-CM

## 2022-02-01 DIAGNOSIS — R29898 Other symptoms and signs involving the musculoskeletal system: Secondary | ICD-10-CM

## 2022-02-01 DIAGNOSIS — R2689 Other abnormalities of gait and mobility: Secondary | ICD-10-CM

## 2022-02-01 NOTE — Therapy (Signed)
OUTPATIENT PHYSICAL THERAPY THORACOLUMBAR treatment   Patient Name: Cassidy Bennett MRN: [id number] DOB:24-Nov-1940, 81 y.o., female Today's Date: 02/01/2022  PT End of Session - 02/01/22 1424     Visit Number 13    Number of Visits 18    Date for PT Re-Evaluation 02/21/22    Authorization Type Primary Medicare Secondary BCBS    Progress Note Due on Visit 18    PT Start Time 1350    PT Stop Time 1420    PT Time Calculation (min) 30 min    Activity Tolerance Patient tolerated treatment well    Behavior During Therapy WFL for tasks assessed/performed               Past Medical History:  Diagnosis Date   Allergy    Complication of anesthesia    History of TB (tuberculosis)    treated 32 plus years ago last cxr 03-15-18  CXR every 5 years   Hypercholesteremia    denies at preop   Hypertension    Osteoarthritis    Osteoporosis    PONV (postoperative nausea and vomiting)    Post-menopausal    Tuberculosis    Past Surgical History:  Procedure Laterality Date   CATARACT EXTRACTION  2018   right eye   COLONOSCOPY  2015   DILATION AND CURETTAGE OF UTERUS  2000   KNEE ARTHROSCOPY  2014   left   LUMBAR DISC SURGERY     SHOULDER SURGERY     right   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   Procedure: TOTAL KNEE ARTHROPLASTY;  Surgeon: Gaynelle Arabian, MD;  Location: [facility];  Service: Orthopedics;  Laterality: Left;   Patient Active Problem List   Diagnosis Date Noted   Osteoarthritis of left knee 08/12/2018   Closed displaced fracture of proximal phalanx of right great toe 01/03/2017   Rotator cuff syndrome of right shoulder 01/03/2017   Hyperlipidemia 09/05/2016   Essential hypertension 08/27/2015   Urine abnormality 03/29/2015   Routine general medical examination at a health care facility 06/26/2013   History of TB (tuberculosis) 06/17/2012   Seasonal allergies 01/22/2008   Osteoporosis 12/13/2006   OA (osteoarthritis) of knee 10/11/2006    PCP: Inda Coke PA  REFERRING PROVIDER: Alda Berthold, DO  Next apt 03/14/21  REFERRING DIAG: M54.17 (ICD-10-CM) - Lumbosacral radiculopathy M54.41,G89.29 (ICD-10-CM) - Chronic right-sided low back pain with right-sided sciatica   Rationale for Evaluation and Treatment Rehabilitation  THERAPY DIAG:  Muscle weakness (generalized)  Other abnormalities of gait and mobility  Other symptoms and signs involving the musculoskeletal system  Other low back pain  ONSET DATE: January 2023  SUBJECTIVE:  SUBJECTIVE STATEMENT:  . PT states that she was reading yesterday and things became blurry in her rt eye.  Noted her whole eye was bloodshot so she went to the ER the wait was hours so she went to urgent care, she still had to wait for hours.  She is to follow up with her MD.  She did not get any sleep last night and has been stressed to her pain is up.   PERTINENT HISTORY:  Hx L TKA, HTN, osteoporosis, HLD, hx TB  PAIN:  Are you having pain? Yes: NPRS scale: 5/10 Pain location: low back Pain description: sore Aggravating factors: sleeping, sitting Relieving factors: meds, movement  PATIENT GOALS feel better   OBJECTIVE:   DIAGNOSTIC FINDINGS:  XR 08/13/20 IMPRESSION: 1. Prominent spondylosis and facet hypertrophy at the lumbosacral junction. No acute bony abnormality.  PATIENT SURVEYS:  Foto :  40 01/17/22:  53   POSTURE: rounded shoulders, forward head, decreased lumbar lordosis, and decreased thoracic kyphosis  PALPATION: Grossly hypomobile thoracic and lumbar spine, tender throughout thoracic/lumbar CPA; TTP lower lumbar paraspinals and L glute max  LUMBAR ROM:   Active  A/PROM  eval 01/17/22  Flexion 0% limited *   Extension 75% limited - felt good 60% limited   Right lateral flexion 75% limited  * 20% limited   Left lateral flexion 75% limited * 50% limited   Right rotation 50% limited * 20% limited  Left rotation 50% limited * 20% limited    (Blank rows = not tested) *= pain   LOWER EXTREMITY MMT:    MMT Right eval 11/21/ Left eval 11/21  Hip flexion 4- 5 4- 5  Hip extension 3+ _0 Hip abduction 4- 5 3+ 4+  Hip adduction      Hip internal rotation      Hip external rotation      Knee flexion 4 4+   4 4-  Knee extension 4 5 4+ 5  Ankle dorsiflexion 4- 5 4+ 4+  Ankle plantarflexion      Ankle inversion      Ankle eversion       (Blank rows = not tested)   FUNCTIONAL TESTS:  5 times sit to stand: 12.30 seconds without UE support;   11/21: 7.33  2 minute walk test: 265 feet ;  11/21:  483 ft.   GAIT: Distance walked: 265 feet Assistive device utilized: None Level of assistance: Complete Independence Comments: 2MWT, slight trunk flexion and R shift, slower cadence with gradually increasing LBP and LLE symptoms            Palpation:  Pt RT paraspinal mm are very tight but muscles relax with manual.   TODAY'S TREATMENT  01/31/22 Manual while pt was prone with noted mal alignment of SI.  Therapist used contract relax technique to properly align SI joint.  Therapist passively stretched B quadriceps which are very tight. RT goes to 90; Lt to about 110, PROM for B hip IR/ER and hamstring mm.   01/27/22: Standing: Warrior I x 5 B Warrior II x 5 B Tree pose x 2 B Hip excursion x 3  Quadriped: Piriformis stretch 1 x 30" bothers Rt knee  Mad cat/old horse x 5 Assisted opposite arm/leg x 5 Supine : Piriformis stretch hold 20" x 5 B Bridge with adduction x 15 hold 5" Dead bug x 10   Manual to decrease tension and improve motion in low back  01/24/22  Wall arch x 10            Side stepping with blue theraband x 2 RT            Standing hip abduction with blue theraband x 10 B             Standing hip extension with blue theraband x 10 B             Wall push up x 10             Pallof x 10 B             Wall slide x 10            T band postural exercises:            Scapular retraction x 10            Row x 10            Shoulder extension x 10            Supine:            Knee to chest x 10            Active hamstring stretch 3  x 30"             Quadriped             Opposite arm/leg raise x 10              Sit to stand x 15               3 D excursion x 3  01/17/22:  Reassessment Prone  Prone on elbow x 2 minutes with deep breathing  Press up x 10  Prone opposite arm/leg raise x 10  Sitting: Sit to stand x 10  01/12/22 Standing:3D hip excursions 10X each  Wall arch with heel raises 2X10  Hip extension 2X10  Hip abduction 2X10  Postural GTB exercise: scap retractions, rows, extensions 2X10  Vector stance 10X5" each LE with 1 UE assist Seated:  hamstring 3X30" each  Piriformis stretch 3X30" each Gait with increased stride, heel to toe gait  01/10/22 Prone: POE x 2 min 2 sets- cueing to relax gluteal mm Hip extension 10x 3" Standing: Side glides front of mirror for feedback  3D hip excursion 10x (limited extension noted) Wall arch with heel raise 10x 3" Prone: child's pose 3x 30" limited Lt knee ROM, 2 yr post-op   PATIENT EDUCATION:  Education details: Patient educated on exam findings, POC, scope of PT, HEP, and continuing to exercise. Person educated: Patient Education method: Explanation, Demonstration, and Handouts Education comprehension: verbalized understanding, returned demonstration, verbal cues required, and tactile cues required   HOME EXERCISE PROGRAM: 01/24/22 - Side Stepping with Resistance at Thighs and Counter Support  - 1 x daily - 7 x weekly - 1 sets - 10 reps - Squatting Anti-Rotation Press  - 1 x daily - 7 x weekly - 1 sets - 10 reps - 5" hold - Standing Hip Extension with Resistance at Ankles and Counter Support  - 1 x daily - 7 x weekly - 1 sets - 10 reps - 5" hold - Diagonal  Hip Extension with Resistance  - 1 x daily - 7 x weekly - 1 sets - 10 reps - 5" hold Access Code: [email or web address] URL: [email or web address] Date: 01/12/2022 Prepared by: Roseanne Reno Exercises - Seated Piriformis Stretch with Trunk Bend  -  2 x daily - 7 x weekly - 1 sets - 3 reps - 30 sec hold - Seated Hamstring Stretch  - 2 x daily - 7 x weekly - 1 sets - 3 reps - 30 sec hold  01/10/22: vector stance               12/27/21              - Standing Row with Resistance with Anchored Resistance at Chest Height Palms Down  - 1 x daily - 7 x weekly - 1 sets - 10 reps - 3-5" hold - Standing Row with Anchored Resistance  - 1 x daily - 7 x weekly - 1 sets - 10 reps - 3-5"  hold - Single Arm Shoulder Extension with Anchored Resistance  - 1 x daily - 7 x weekly - 1 sets - 10 reps - 3-5"  hold  Access Code: [email or web address] 12/21/21 - Supine Transversus Abdominis Bracing - Hands on Stomach  - 2 x daily - 7 x weekly - 1 sets - 10 reps - 5 second hold - Supine March  - 2 x daily - 7 x weekly - 2 sets - 10 reps - Small Range Straight Leg Raise  - 2 x daily - 7 x weekly - 2 sets - 10 reps  12/15/21 - Prone Gluteal Sets  - 2 x daily - 7 x weekly - 1 sets - 10 reps - Prone Heel Squeeze  - 2 x daily - 7 x weekly - 1 sets - 10 reps - 5" hold - Supine Single Knee to Chest Stretch  - 2 x daily - 7 x weekly - 1 sets - 10 reps - 20" hold - Supine Hamstring Stretch  - 2 x daily - 7 x weekly - 1 sets - 10 reps - 20" hold Date: 12/06/2021 - Supine Bridge  - 2-3 x daily - 7 x weekly - 2-3 sets - 10 reps - Clamshell  - 2-3 x daily - 7 x weekly - 2-3 sets - 10 reps - Prone Press Up On Elbows  - 2-3 x daily - 7 x weekly - 5 reps - 20 second hold - Standing Lumbar Extension  - 2-3 x daily - 7 x weekly - 2 sets - 10 reps  ASSESSMENT:  CLINICAL IMPRESSION: PT treatment consisted of manual to decrease tensio and pain and PROM.  Pt pain-free at end of session.  PT continues to need manual assist to keep lumbar  stabilized with quadriped exercises.  Continue with core strengthening and stretching to improve function.  OBJECTIVE IMPAIRMENTS Abnormal gait, decreased activity tolerance, decreased balance, decreased endurance, decreased mobility, difficulty walking, decreased ROM, decreased strength, increased muscle spasms, impaired flexibility, improper body mechanics, postural dysfunction, and pain.   ACTIVITY LIMITATIONS carrying, lifting, bending, standing, squatting, stairs, transfers, locomotion level, and caring for others  PARTICIPATION LIMITATIONS: meal prep, cleaning, laundry, shopping, community activity, and yard work  PERSONAL FACTORS Age, Time since onset of injury/illness/exacerbation, and 3+ comorbidities: Hx L TKA, HTN, osteoporosis, HLD, hx TB  are also affecting patient's functional outcome.   REHAB POTENTIAL: Good  CLINICAL DECISION MAKING: Stable/uncomplicated  EVALUATION COMPLEXITY: Low   GOALS: Goals reviewed with patient? Yes  SHORT TERM GOALS: Target date: 12/27/2021  Patient will be independent with HEP in order to improve functional outcomes. Baseline:  Goal status: MET  2.  Patient will report at least 25% improvement in symptoms for improved quality of life. Baseline:  Goal  status: MET   LONG TERM GOALS: Target date: 01/17/2022  Patient will report at least 75% improvement in symptoms for improved quality of life. Baseline:  Goal status: IN PROGRESS  2.  Patient will improve FOTO score to expected outcomes in order to indicate improved tolerance to activity. Baseline: complete next session Goal status: MET  3.  Patient will demonstrate at least 25% improvement in lumbar ROM in all restricted planes for improved ability to move trunk while completing chores. Baseline: see AROM Goal status: IN PROGRESS  4.  Patient will be able to ambulate at least 325 feet in 2MWT in order to demonstrate improved tolerance to activity. Baseline: 265 feet Goal status:  MET  5.  Patient will demonstrate grade of 4+/5 MMT grade in all tested musculature as evidence of improved strength to assist with stair ambulation and gait.   Baseline: see MMT Goal status: IN PROGRESS    PLAN: PT FREQUENCY: 2x/week  PT DURATION: 6 weeks a total of 9 weeks treatment   PLANNED INTERVENTIONS: Therapeutic exercises, Therapeutic activity, Neuromuscular re-education, Balance training, Gait training, Patient/Family education, Joint manipulation, Joint mobilization, Stair training, Orthotic/Fit training, DME instructions, Aquatic Therapy, Dry Needling, Electrical stimulation, Spinal manipulation, Spinal mobilization, Cryotherapy, Moist heat, Compression bandaging, scar mobilization, Splintting, Taping, Traction, Ultrasound, Ionotophoresis 37m/ml Dexamethasone, and Manual therapy  PLAN FOR NEXT SESSION:  Continue with extension bias as patient stating improvement with most extension. Core and glute strength, lumbar mobility.  CRayetta Humphrey PRichmondCLT [phone] 02/01/2022, [phone]

## 2022-02-03 ENCOUNTER — Ambulatory Visit (INDEPENDENT_AMBULATORY_CARE_PROVIDER_SITE_OTHER): Payer: Medicare Other | Admitting: Physician Assistant

## 2022-02-03 ENCOUNTER — Encounter: Payer: Self-pay | Admitting: Physician Assistant

## 2022-02-03 VITALS — BP 144/80 | HR 81 | Temp 97.7°F | Ht 62.0 in | Wt 113.2 lb

## 2022-02-03 DIAGNOSIS — R011 Cardiac murmur, unspecified: Secondary | ICD-10-CM | POA: Diagnosis not present

## 2022-02-03 DIAGNOSIS — R0609 Other forms of dyspnea: Secondary | ICD-10-CM

## 2022-02-03 DIAGNOSIS — R82998 Other abnormal findings in urine: Secondary | ICD-10-CM

## 2022-02-03 LAB — URINALYSIS, ROUTINE W REFLEX MICROSCOPIC
Bilirubin Urine: NEGATIVE
Hgb urine dipstick: NEGATIVE
Ketones, ur: NEGATIVE
Leukocytes,Ua: NEGATIVE
Nitrite: NEGATIVE
Specific Gravity, Urine: 1.01 (ref 1.000–1.030)
Total Protein, Urine: NEGATIVE
Urine Glucose: NEGATIVE
Urobilinogen, UA: 0.2 (ref 0.0–1.0)
pH: 6.5 (ref 5.0–8.0)

## 2022-02-03 NOTE — Progress Notes (Signed)
Cassidy Bennett is a 81 y.o. female here for a follow up of a pre-existing problem.  History of Present Illness:   Chief Complaint  Patient presents with   Follow-up    ED visit; conjunctivitis. Heart Murmur found.    HPI  ER follow up Patient was admitted to the ER on 12/5 for right eye pain. She mentions that 4 days prior to ER visit, she was at a meeting where she drank [place] Tea and a coworker told her her right eye was red, though she felt no pain, vision changes, photophobia, or tearing. Patient reports that she noticed she could not read the newspaper with her right eye in the morning of 12/5. She explains that her blood pressure was elevated that morning. She had labs and an EKG completed, showing no abnormalities in labs and NSR with R atrial enlargement and heart murmur on the EKG. Uncontrolled hypertension was also noted in this ER visit. She saw her ophthalmologist that day and was advised to follow up with cardiology or PCP to have echo completed.  Patient reports that she does not experience chest pain, but does have SOB when walking upstairs and with activity. She states that she tends to lie down when she reaches upstairs. She reports that she has not been to a  cardiologist. She regularly checks blood pressure at home and is complaint with 5 mg norvasc. Patient mentions that her right eye is improving.  Bubbles in pee Patient is reporting that she sees bubbles with her urination occurring for a couple of weeks now. She denies pain with urination or blood.   Past Medical History:  Diagnosis Date   Allergy    Complication of anesthesia    History of TB (tuberculosis)    treated 61 plus years ago last cxr 03-15-18  CXR every 5 years   Hypercholesteremia    denies at preop   Hypertension    Osteoarthritis    Osteoporosis    PONV (postoperative nausea and vomiting)    Post-menopausal    Tuberculosis      Social History   Tobacco Use   Smoking status: Never    Smokeless tobacco: Never  Vaping Use   Vaping Use: Never used  Substance Use Topics   Alcohol use: Not Currently    Alcohol/week: 5.0 standard drinks of alcohol    Types: 5 Glasses of wine per week   Drug use: No    Past Surgical History:  Procedure Laterality Date   CATARACT EXTRACTION  2018   right eye   COLONOSCOPY  2015   DILATION AND CURETTAGE OF UTERUS  2000   KNEE ARTHROSCOPY  2014   left   LUMBAR DISC SURGERY     SHOULDER SURGERY     right   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   Procedure: TOTAL KNEE ARTHROPLASTY;  Surgeon: Gaynelle Arabian, MD;  Location: [facility];  Service: Orthopedics;  Laterality: Left;    Family History  Problem Relation Age of Onset   Other Mother        malaria   Other Father    Cancer Other        stomach   Breast cancer Neg Hx     Allergies  Allergen Reactions   Metoprolol Nausea And Vomiting   Morphine And Related Nausea And Vomiting   Other Nausea And Vomiting    Current Medications:   Current Outpatient Medications:    amLODipine (NORVASC) 5 MG tablet, Take 1 tablet (  5 mg total) by mouth daily., Disp: 90 tablet, Rfl: 1   Ascorbic Acid (VITAMIN C PO), Take 1,000 mg by mouth daily. , Disp: , Rfl:    b complex vitamins tablet, Take 1 tablet by mouth daily. Every other day, Disp: , Rfl:    Calcium Carb-Cholecalciferol (CALCIUM 600 + D PO), Take 1 tablet by mouth daily., Disp: , Rfl:    magnesium 30 MG tablet, Take 30 mg by mouth 3 (three) times a week., Disp: , Rfl:    polyethylene glycol (MIRALAX / GLYCOLAX) packet, Take 17 g by mouth daily as needed for moderate constipation. , Disp: , Rfl:    tizanidine (ZANAFLEX) 2 MG capsule, Take 1 capsule (2 mg total) by mouth at bedtime as needed for muscle spasms., Disp: 30 capsule, Rfl: 3   Vitamin A 2400 MCG (8000 UT) TABS, Take 8,000 Units by mouth daily. , Disp: , Rfl:    vitamin B-12 (CYANOCOBALAMIN) 1000 MCG tablet, Take 1,000 mcg by mouth daily., Disp: , Rfl:    vitamin E 400 UNIT  capsule, Take 400 Units by mouth daily., Disp: , Rfl:    gabapentin (NEURONTIN) 100 MG capsule, Take 1 tablet at bedtime x 1 week, then increase to 2 tablet at bedtime. (Patient not taking: Reported on 02/03/2022), Disp: 60 capsule, Rfl: 5   Review of Systems:   Review of Systems  Respiratory:  Positive for shortness of breath.   Genitourinary:        (+) bubbles in pee    Vitals:   Vitals:   02/03/22 1015  BP: (!) 144/80  Pulse: 81  Temp: 97.7 F (36.5 C)  TempSrc: Temporal  SpO2: 98%  Weight: 113 lb 3.2 oz (51.3 kg)  Height: 5\' 2"  (1.575 m)     Body mass index is 20.7 kg/m.  Physical Exam:   Physical Exam Constitutional:      General: She is not in acute distress.    Appearance: Normal appearance. She is not ill-appearing.  HENT:     Head: Normocephalic and atraumatic.     Right Ear: External ear normal.     Left Ear: External ear normal.  Eyes:     Extraocular Movements: Extraocular movements intact.     Right eye: Normal extraocular motion.     Conjunctiva/sclera:     Right eye: Right conjunctiva is injected.     Pupils: Pupils are equal, round, and reactive to light.  Cardiovascular:     Rate and Rhythm: Normal rate and regular rhythm.     Heart sounds: Murmur heard.     Systolic murmur is present.     No gallop.  Pulmonary:     Effort: Pulmonary effort is normal. No respiratory distress.     Breath sounds: Normal breath sounds. No wheezing or rales.  Skin:    General: Skin is warm and dry.  Neurological:     Mental Status: She is alert and oriented to person, place, and time.  Psychiatric:        Judgment: Judgment normal.     Assessment and Plan:   Frothy urine Update urinalysis today and provide any recommendations as indicated by results Continue excellent hydration No additional symptoms at this time however, if any symptoms do arise she was told to follow-up with  Murmur; DOE (dyspnea on exertion) New While I do think that she would  benefit from an echocardiogram, given that she is having dyspnea on exertion, I feel as though cardiology evaluation  is warranted, urgent, and will defer any imaging and further studies to them Patient and husband are agreeable to plan Recommend ER evaluation if any chest pain, worsening shortness of breath or other new concerning symptoms.  I,Verona Buck,acting as a Education administrator for [employer], PA.,have documented all relevant documentation on the behalf of Inda Coke, PA,as directed by  Inda Coke, PA while in the presence of Inda Coke, [place].  I, Inda Coke, [place], have reviewed all documentation for this visit. The documentation on 02/03/22 for the exam, diagnosis, procedures, and orders are all accurate and complete.\  Inda Coke, PA-C

## 2022-02-03 NOTE — Patient Instructions (Signed)
It was great to see you!  Increase amlodipine to 10 mg daily Referral to cardiology  Continue to keep an eye on your blood pressure for [place]  We will check your urine today  Take care,  Jarold Motto PA-C

## 2022-02-06 NOTE — Progress Notes (Unsigned)
Cardiology Office Note:   Date:  02/08/2022  NAME:  Cassidy Bennett    MRN: [id number] DOB:  1940/12/02   PCP:  Inda Coke, PA  Cardiologist:  None  Electrophysiologist:  None   Referring MD: Inda Coke, PA   Chief Complaint  Patient presents with   Shortness of Breath         History of Present Illness:   Cassidy Bennett is a 81 y.o. female with a hx of HTN who is being seen today for the evaluation of SOB/murmur at the request of Inda Coke, [place].  She reports shortness of breath for years.  Apparently when she climbs the stairs at home she can get winded.  She actually does a lot of activity.  She uses a stationary bike every day for up to 30 minutes.  Apparently stationary bike is okay but incline can bother her.  No chest pain or chest pressure.  She does have a 2 out of 6 systolic ejection murmur.  Suspect mild to moderate aortic stenosis.  It is early peaking.  Nothing to suggest severe disease.  Do not believe this is mitral valve regurgitation either.  She does have a right carotid bruit.  She had carotid ultrasounds in 2018 that were unremarkable.  The sound is quite different from her cardiac murmur.  She is not diabetic.  Her blood pressure is elevated today.  She tells me she is going through some stress.  She tells me it is controlled at home.  I encouraged her to keep a close eye on this.  She has never had a heart attack or stroke.  Her EKG from her primary care office was normal.  She is not on cholesterol medication.  She is married.  She is a retired Programmer, multimedia.  She has 3 children.  She has 5 grandchildren.  She does not smoke.  No alcohol or drug use is reported.  Again she exercise daily.  Needs further workup for murmur and carotid bruit.  T chol 207, HDL 54, LDL 123, TG 248  Past Medical History: Past Medical History:  Diagnosis Date   Allergy    Complication of anesthesia    History of TB (tuberculosis)    treated 71 plus years  ago last cxr 03-15-18  CXR every 5 years   Hypercholesteremia    denies at preop   Hypertension    Osteoarthritis    Osteoporosis    PONV (postoperative nausea and vomiting)    Post-menopausal    Tuberculosis     Past Surgical History: Past Surgical History:  Procedure Laterality Date   CATARACT EXTRACTION  2018   right eye   COLONOSCOPY  2015   DILATION AND CURETTAGE OF UTERUS  2000   KNEE ARTHROSCOPY  2014   left   LUMBAR DISC SURGERY     SHOULDER SURGERY     right   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   Ascorbic Acid (VITAMIN C PO) Take 1,000 mg by mouth daily.    b complex vitamins tablet Take 1 tablet by mouth daily. Every other day   Calcium Carb-Cholecalciferol (CALCIUM 600 + D PO) Take 1 tablet by mouth daily.   gabapentin (NEURONTIN)  100 MG capsule Take 1 tablet at bedtime x 1 week, then increase to 2 tablet at bedtime.   magnesium 30 MG tablet Take 30 mg by mouth 3 (three) times a week.   polyethylene glycol (MIRALAX / GLYCOLAX) packet Take 17 g by mouth daily as needed for moderate constipation.    Vitamin A 2400 MCG (8000 UT) TABS Take 8,000 Units by mouth daily.    vitamin B-12 (CYANOCOBALAMIN) 1000 MCG tablet Take 1,000 mcg by mouth daily.   vitamin E 400 UNIT capsule Take 400 Units by mouth daily.     Allergies:    Metoprolol, Morphine and related, and Other   Social History: Social History   Socioeconomic History   Marital status: Married    Spouse name: Not on file   Number of children: 3   Years of education: Not on file   Highest education level: Not on file  Occupational History   Occupation: Retired school principal  Tobacco Use   Smoking status: Never   Smokeless tobacco: Never   Vaping Use   Vaping Use: Never used  Substance and Sexual Activity   Alcohol use: Not Currently    Alcohol/week: 5.0 standard drinks of alcohol    Types: 5 Glasses of wine per week   Drug use: No   Sexual activity: Not Currently  Other Topics Concern   Not on file  Social History Narrative   ** Merged History Encounter ** Lives with husband in two-story home, has 3 sons and 5 grandchildren in [place] area      Active with volunteering at school.    Enjoys gardening and using stationary bike to cycle   Right handed   Drinks caffeine   Two story home      Social Determinants of Health   Financial Resource Strain: Low Risk  (09/14/2020)   Overall Financial Resource Strain (CARDIA)    Difficulty of Paying Living Expenses: Not hard at all  Food Insecurity: No Food Insecurity (09/14/2020)   Hunger Vital Sign    Worried About Running Out of Food in the Last Year: Never true    Ran Out of Food in the Last Year: Never true  Transportation Needs: No Transportation Needs (09/14/2020)   PRAPARE - Hydrologist (Medical): No    Lack of Transportation (Non-Medical): No  Physical Activity: Inactive (09/14/2020)   Exercise Vital Sign    Days of Exercise per Week: 0 days    Minutes of Exercise per Session: 0 min  Stress: Stress Concern Present (09/14/2020)   [place]    Feeling of Stress : To some extent  Social Connections: Moderately Isolated (09/14/2020)   Social Connection and Isolation Panel [NHANES]    Frequency of Communication with Friends and Family: Once a week    Frequency of Social Gatherings with Friends and Family: Once a week    Attends Religious Services: 1 to 4 times per year    Active Member of [employer] or Organizations: No    Attends Archivist Meetings: Never    Marital Status: Married     Family History: The patient's family history includes Cancer in an  other family member; Other in her father and mother. There is no history of Breast cancer.  ROS:   All other ROS reviewed and negative. Pertinent positives noted in the HPI.     EKGs/Labs/Other Studies Reviewed:   The following studies were personally reviewed  by me today:  EKG:  EKG EKG reviewed from primary care physician office which demonstrates normal sinus rhythm heart rate 93, no acute ischemic changes or evidence of infarction.  This was personally reviewed by me in the office today.  Recent Labs: 01/16/2022: ALT 15; BUN 18; Creatinine, Ser 0.59; Hemoglobin 13.1; Platelets 213.0; Potassium 4.6; Sodium 139; TSH 3.97   Recent Lipid Panel    Component Value Date/Time   CHOL 207 (H) 01/16/2022 1327   TRIG 248.0 (H) 01/16/2022 1327   HDL 54.30 01/16/2022 1327   CHOLHDL 4 01/16/2022 1327   VLDL 49.6 (H) 01/16/2022 1327   LDLCALC 134 (H) 10/27/2021 0821   LDLCALC 105 (H) 10/24/2019 0815   LDLDIRECT 123.0 01/16/2022 1327    Physical Exam:   VS:  BP (!) 160/82   Pulse 89   Ht 5\' 2"  (1.575 m)   Wt 114 lb 6.4 oz (51.9 kg)   SpO2 99%   BMI 20.92 kg/m    Wt Readings from Last 3 Encounters:  02/08/22 114 lb 6.4 oz (51.9 kg)  02/03/22 113 lb 3.2 oz (51.3 kg)  01/16/22 112 lb (50.8 kg)    General: Well nourished, well developed, in no acute distress Head: Atraumatic, normal size  Eyes: PEERLA, EOMI  Neck: Supple, no JVD, right carotid bruit noted Endocrine: No thryomegaly Cardiac: Normal S1, S2; RRR; 2 out of 6 systolic ejection murmur Lungs: Clear to auscultation bilaterally, no wheezing, rhonchi or rales  Abd: Soft, nontender, no hepatomegaly  Ext: No edema, pulses 2+ Musculoskeletal: No deformities, BUE and BLE strength normal and equal Skin: Warm and dry, no rashes   Neuro: Alert and oriented to person, place, time, and situation, CNII-XII grossly intact, no focal deficits  Psych: Normal mood and affect   ASSESSMENT:   MILEAH HEMMER is a 81 y.o. female who  presents for the following: 1. SOB (shortness of breath) on exertion   2. Murmur   3. Bruit of right carotid artery     PLAN:   1. SOB (shortness of breath) on exertion -Suspect an element of deconditioning.  She can do stationary bike without issues.  When she does incline it bothers her.  [facility].  I encouraged her to continue with her level of activity.  Her murmur is likely mild to moderate aortic stenosis.  Echo is pending.  We will order this today.  She should also keep an eye on her blood pressure.  It is elevated today.  Will not change medications as she reports stress.  We will keep a close eye on this.  2. Murmur -Echocardiogram.  Suspect mild to moderate aortic stenosis.  3. Bruit of right carotid artery -Noticeable right carotid bruit.  Carotid ultrasound ordered.  Disposition: Return in about 1 year (around 02/09/2023).  Medication Adjustments/Labs and Tests Ordered: Current medicines are reviewed at length with the patient today.  Concerns regarding medicines are outlined above.  Orders Placed This Encounter  Procedures   ECHOCARDIOGRAM COMPLETE   VAS 02/11/2023 CAROTID   No orders of the defined types were placed in this encounter.   Patient Instructions  Medication Instructions:  The current medical regimen is effective;  continue present plan and medications.  *If you need a refill on your cardiac medications before your next appointment, please call your pharmacy*   Testing/Procedures: Echocardiogram - Your physician has requested that you have an echocardiogram. Echocardiography is a painless test that uses sound waves to create  images of your heart. It provides your doctor with information about the size and shape of your heart and how well your heart's chambers and valves are working. This procedure takes approximately one hour. There are no restrictions for this procedure.    Your physician has requested that you have a carotid duplex. This test  is an ultrasound of the carotid arteries in your neck. It looks at blood flow through these arteries that supply the brain with blood. Allow one hour for this exam. There are no restrictions or special instructions.   Follow-Up: At [facility], you and your health needs are our priority.  As part of our continuing mission to provide you with exceptional heart care, we have created designated Provider Care Teams.  These Care Teams include your primary Cardiologist (physician) and Advanced Practice Providers (APPs -  Physician Assistants and Nurse Practitioners) who all work together to provide you with the care you need, when you need it.  We recommend signing up for the patient portal called "MyChart".  Sign up information is provided on this After Visit Summary.  MyChart is used to connect with patients for Virtual Visits (Telemedicine).  Patients are able to view lab/test results, encounter notes, upcoming appointments, etc.  Non-urgent messages can be sent to your provider as well.   To learn more about what you can do with MyChart, go to [email or web address].    Your next appointment:   12 month(s)  The format for your next appointment:   In Person  Provider:   Eleonore Chiquito, MD           Signed, Addison Naegeli. Audie Box, MD, [place]  [address], [place], [place] [address] [phone]  02/08/2022 8:19 AM

## 2022-02-08 ENCOUNTER — Encounter (HOSPITAL_COMMUNITY): Payer: Medicare Other | Admitting: Physical Therapy

## 2022-02-08 ENCOUNTER — Ambulatory Visit: Payer: Medicare Other | Attending: Cardiovascular Disease | Admitting: Cardiovascular Disease

## 2022-02-08 ENCOUNTER — Encounter: Payer: Self-pay | Admitting: Cardiovascular Disease

## 2022-02-08 VITALS — BP 160/82 | HR 89 | Ht 62.0 in | Wt 114.4 lb

## 2022-02-08 DIAGNOSIS — R0602 Shortness of breath: Secondary | ICD-10-CM | POA: Insufficient documentation

## 2022-02-08 DIAGNOSIS — R011 Cardiac murmur, unspecified: Secondary | ICD-10-CM | POA: Insufficient documentation

## 2022-02-08 DIAGNOSIS — R0989 Other specified symptoms and signs involving the circulatory and respiratory systems: Secondary | ICD-10-CM | POA: Insufficient documentation

## 2022-02-08 NOTE — Patient Instructions (Signed)
Medication Instructions:  The current medical regimen is effective;  continue present plan and medications.  *If you need a refill on your cardiac medications before your next appointment, please call your pharmacy*   Testing/Procedures: Echocardiogram - Your physician has requested that you have an echocardiogram. Echocardiography is a painless test that uses sound waves to create images of your heart. It provides your doctor with information about the size and shape of your heart and how well your heart's chambers and valves are working. This procedure takes approximately one hour. There are no restrictions for this procedure.    Your physician has requested that you have a carotid duplex. This test is an ultrasound of the carotid arteries in your neck. It looks at blood flow through these arteries that supply the brain with blood. Allow one hour for this exam. There are no restrictions or special instructions.   Follow-Up: At [facility], you and your health needs are our priority.  As part of our continuing mission to provide you with exceptional heart care, we have created designated Provider Care Teams.  These Care Teams include your primary Cardiologist (physician) and Advanced Practice Providers (APPs -  Physician Assistants and Nurse Practitioners) who all work together to provide you with the care you need, when you need it.  We recommend signing up for the patient portal called "MyChart".  Sign up information is provided on this After Visit Summary.  MyChart is used to connect with patients for Virtual Visits (Telemedicine).  Patients are able to view lab/test results, encounter notes, upcoming appointments, etc.  Non-urgent messages can be sent to your provider as well.   To learn more about what you can do with MyChart, go to [email or web address].    Your next appointment:   12 month(s)  The format for your next appointment:   In Person  Provider:   Lennie Odor,  MD

## 2022-02-14 ENCOUNTER — Encounter (HOSPITAL_COMMUNITY): Payer: Medicare Other | Admitting: Physical Therapy

## 2022-02-22 ENCOUNTER — Encounter (HOSPITAL_COMMUNITY): Payer: Medicare Other | Admitting: Physical Therapy

## 2022-02-22 ENCOUNTER — Telehealth (HOSPITAL_COMMUNITY): Payer: Self-pay | Admitting: Physical Therapy

## 2022-02-22 NOTE — Telephone Encounter (Signed)
First no show:  Therapist called pt re first no show.  Pt states that she was baking cookies and lost track of time.  Pt will be here on Friday.  Virgina Organ, PT CLT [phone]

## 2022-02-24 ENCOUNTER — Ambulatory Visit (HOSPITAL_COMMUNITY): Payer: Medicare Other | Admitting: Physical Therapy

## 2022-02-24 DIAGNOSIS — M6281 Muscle weakness (generalized): Secondary | ICD-10-CM | POA: Diagnosis not present

## 2022-02-24 DIAGNOSIS — M5459 Other low back pain: Secondary | ICD-10-CM

## 2022-02-24 DIAGNOSIS — R2689 Other abnormalities of gait and mobility: Secondary | ICD-10-CM | POA: Diagnosis not present

## 2022-02-24 DIAGNOSIS — R29898 Other symptoms and signs involving the musculoskeletal system: Secondary | ICD-10-CM

## 2022-02-24 NOTE — Therapy (Signed)
OUTPATIENT PHYSICAL THERAPY THORACOLUMBAR treatment   Patient Name: Cassidy Bennett MRN: [id number] DOB:[birth year], 81 y.o., female Today's Date: 02/01/2022  PT End of Session - 02/24/22 1415    Visit Number 14    Number of Visits 18    Date for PT Re-Evaluation 03/10/22    Authorization Type Primary Medicare Secondary BCBS    Progress Note Due on Visit 18    PT Start Time 1335    PT Stop Time 1415    PT Time Calculation (min) 40 min    Activity Tolerance Patient tolerated treatment well    Behavior During Therapy WFL for tasks assessed/performed               Past Medical History:  Diagnosis Date   Allergy    Complication of anesthesia    History of TB (tuberculosis)    treated 52 plus years ago last cxr 03-15-18  CXR every 5 years   Hypercholesteremia    denies at preop   Hypertension    Osteoarthritis    Osteoporosis    PONV (postoperative nausea and vomiting)    Post-menopausal    Tuberculosis    Past Surgical History:  Procedure Laterality Date   CATARACT EXTRACTION  2018   right eye   COLONOSCOPY  2015   DILATION AND CURETTAGE OF UTERUS  2000   KNEE ARTHROSCOPY  2014   left   LUMBAR DISC SURGERY     SHOULDER SURGERY     right   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   Procedure: TOTAL KNEE ARTHROPLASTY;  Surgeon: Gaynelle Arabian, MD;  Location: [facility];  Service: Orthopedics;  Laterality: Left;   Patient Active Problem List   Diagnosis Date Noted   Osteoarthritis of left knee 08/12/2018   Closed displaced fracture of proximal phalanx of right great toe 01/03/2017   Rotator cuff syndrome of right shoulder 01/03/2017   Hyperlipidemia 09/05/2016   Essential hypertension 08/27/2015   Urine abnormality 03/29/2015   Routine general medical examination at a health care facility 06/26/2013   History of TB (tuberculosis) 06/17/2012   Seasonal allergies 01/22/2008   Osteoporosis 12/13/2006   OA (osteoarthritis) of knee 10/11/2006    PCP: Inda Coke PA  REFERRING PROVIDER: Alda Berthold, DO  Next apt 03/14/21  REFERRING DIAG: M54.17 (ICD-10-CM) - Lumbosacral radiculopathy M54.41,G89.29 (ICD-10-CM) - Chronic right-sided low back pain with right-sided sciatica   Rationale for Evaluation and Treatment Rehabilitation  THERAPY DIAG:  Muscle weakness (generalized)  Other abnormalities of gait and mobility  Other symptoms and signs involving the musculoskeletal system  Other low back pain  ONSET DATE: January 2023  SUBJECTIVE:  SUBJECTIVE STATEMENT:   Pt states that her back feels better and the pulling on the outside of her Lt thigh is gone.  She does her exercises at home.  She is doing the rubber band and using the bike and TM. PERTINENT HISTORY:  Hx L TKA, HTN, osteoporosis, HLD, hx TB  PAIN:  Are you having pain? Yes: NPRS scale: 4/10 Pain location: low back Pain description: sore Aggravating factors: sleeping, sitting Relieving factors: meds, movement  PATIENT GOALS feel better   OBJECTIVE:   DIAGNOSTIC FINDINGS:  XR 08/13/20 IMPRESSION: 1. Prominent spondylosis and facet hypertrophy at the lumbosacral junction. No acute bony abnormality.  PATIENT SURVEYS:  Foto :  40 01/17/22:  53   POSTURE: rounded shoulders, forward head, decreased lumbar lordosis, and decreased thoracic kyphosis  PALPATION: Grossly hypomobile thoracic and lumbar spine, tender throughout thoracic/lumbar CPA; TTP lower lumbar paraspinals and L glute max  LUMBAR ROM:   Active  A/PROM  eval 01/17/22  Flexion 0% limited *   Extension 75% limited - felt good 60% limited   Right lateral flexion 75% limited * 20% limited   Left lateral flexion 75% limited * 50% limited   Right rotation 50% limited * 20% limited  Left rotation 50% limited * 20% limited     (Blank rows = not tested) *= pain   LOWER EXTREMITY MMT:    MMT Right eval 11/21/ Left eval 11/21  Hip flexion 4- 5 4- 5  Hip extension 3+ _0 Hip abduction 4- 5 3+ 4+  Hip adduction      Hip internal rotation      Hip external rotation      Knee flexion 4 4+   4 4-  Knee extension 4 5 4+ 5  Ankle dorsiflexion 4- 5 4+ 4+  Ankle plantarflexion      Ankle inversion      Ankle eversion       (Blank rows = not tested)   FUNCTIONAL TESTS:  5 times sit to stand: 12.30 seconds without UE support;   11/21: 7.33  2 minute walk test: 265 feet ;  11/21:  483 ft.   GAIT: Distance walked: 265 feet Assistive device utilized: None Level of assistance: Complete Independence Comments: 2MWT, slight trunk flexion and R shift, slower cadence with gradually increasing LBP and LLE symptoms            Palpation:  Pt RT paraspinal mm are very tight but muscles relax with manual.   TODAY'S TREATMENT 02/24/22 Wall arch x 10 B UE  flexion ITband stretch B x 30" x 3  Functional squat x 10 Wall push up  Sitting : Piriformis stretch x 2  Sit to stand Thoracic excursion x 3  Prone:  B shoulder extension x 10 W back x 10 Hip extension x 10 Opposite arm/leg x 10   POE x 1 minute Supine: Bridge x 15 Knee to chest 3 x 30"    01/31/22 Manual while pt was prone with noted mal alignment of SI.  Therapist used contract relax technique to properly align SI joint.  Therapist passively stretched B quadriceps which are very tight. RT goes to 90; Lt to about 110, PROM for B hip IR/ER and hamstring mm.   01/27/22: Standing: Warrior I x 5 B Warrior II x 5 B Tree pose x 2 B Hip excursion x 3  Quadriped: Piriformis stretch 1 x 30" bothers Rt knee  Mad cat/old horse x 5  Assisted opposite arm/leg x 5 Supine : Piriformis stretch hold 20" x 5 B Bridge with adduction x 15 hold 5" Dead bug x 10   Manual to decrease tension and improve motion in low back  01/24/22            Wall arch x  10            Side stepping with blue theraband x 2 RT            Standing hip abduction with blue theraband x 10 B             Standing hip extension with blue theraband x 10 B            Wall push up x 10             Pallof x 10 B             Wall slide x 10            T band postural exercises:            Scapular retraction x 10            Row x 10            Shoulder extension x 10            Supine:            Knee to chest x 10            Active hamstring stretch 3  x 30"             Quadriped             Opposite arm/leg raise x 10              Sit to stand x 15               3 D excursion x 3  01/17/22:  Reassessment Prone  Prone on elbow x 2 minutes with deep breathing  Press up x 10  Prone opposite arm/leg raise x 10  Sitting: Sit to stand x 10   PATIENT EDUCATION:  Education details: Patient educated on exam findings, POC, scope of PT, HEP, and continuing to exercise. Person educated: Patient Education method: Explanation, Demonstration, and Handouts Education comprehension: verbalized understanding, returned demonstration, verbal cues required, and tactile cues required   HOME EXERCISE PROGRAM: 02/24/22 - Prone Hip Extension  - 1 x daily - 7 x weekly - 1 sets - 10 reps - 3-5" hold - Prone Alternating Arm and Leg Lifts  - 1 x daily - 7 x weekly - 1 sets - 10 reps - 3-5" hold - Prone Scapular Slide with Shoulder Extension  - 1 x daily - 7 x weekly - 1 sets - 10 reps - 3-5" hold - Prone W Scapular Retraction  - 1 x daily - 7 x weekly - 1 sets - 10 reps - 3-5" hold 01/24/22 - Side Stepping with Resistance at Thighs and Counter Support  - 1 x daily - 7 x weekly - 1 sets - 10 reps - Squatting Anti-Rotation Press  - 1 x daily - 7 x weekly - 1 sets - 10 reps - 5" hold - Standing Hip Extension with Resistance at Ankles and Counter Support  - 1 x daily - 7 x weekly - 1 sets - 10 reps - 5" hold - Diagonal Hip Extension with Resistance  - 1 x  daily - 7 x weekly - 1 sets  - 10 reps - 5" hold Access Code: [email or web address] URL: [email or web address] Date: 01/12/2022 Prepared by: Roseanne Reno Exercises - Seated Piriformis Stretch with Trunk Bend  - 2 x daily - 7 x weekly - 1 sets - 3 reps - 30 sec hold - Seated Hamstring Stretch  - 2 x daily - 7 x weekly - 1 sets - 3 reps - 30 sec hold  01/10/22: vector stance               12/27/21              - Standing Row with Resistance with Anchored Resistance at Chest Height Palms Down  - 1 x daily - 7 x weekly - 1 sets - 10 reps - 3-5" hold - Standing Row with Anchored Resistance  - 1 x daily - 7 x weekly - 1 sets - 10 reps - 3-5"  hold - Single Arm Shoulder Extension with Anchored Resistance  - 1 x daily - 7 x weekly - 1 sets - 10 reps - 3-5"  hold  Access Code: [email or web address] 12/21/21 - Supine Transversus Abdominis Bracing - Hands on Stomach  - 2 x daily - 7 x weekly - 1 sets - 10 reps - 5 second hold - Supine March  - 2 x daily - 7 x weekly - 2 sets - 10 reps - Small Range Straight Leg Raise  - 2 x daily - 7 x weekly - 2 sets - 10 reps  12/15/21 - Prone Gluteal Sets  - 2 x daily - 7 x weekly - 1 sets - 10 reps - Prone Heel Squeeze  - 2 x daily - 7 x weekly - 1 sets - 10 reps - 5" hold - Supine Single Knee to Chest Stretch  - 2 x daily - 7 x weekly - 1 sets - 10 reps - 20" hold - Supine Hamstring Stretch  - 2 x daily - 7 x weekly - 1 sets - 10 reps - 20" hold Date: 12/06/2021 - Supine Bridge  - 2-3 x daily - 7 x weekly - 2-3 sets - 10 reps - Clamshell  - 2-3 x daily - 7 x weekly - 2-3 sets - 10 reps - Prone Press Up On Elbows  - 2-3 x daily - 7 x weekly - 5 reps - 20 second hold - Standing Lumbar Extension  - 2-3 x daily - 7 x weekly - 2 sets - 10 reps  ASSESSMENT:  CLINICAL IMPRESSION: PT has not been able to come in since 12/6 due to eye issues and needing a crown.  PT is improving but still has increased back pain and will benefit from the last 4 visits to round out a good HEP.  Pt HEP updated today to  include prone exercises.  Continue with core strengthening and stretching to improve function.  OBJECTIVE IMPAIRMENTS Abnormal gait, decreased activity tolerance, decreased balance, decreased endurance, decreased mobility, difficulty walking, decreased ROM, decreased strength, increased muscle spasms, impaired flexibility, improper body mechanics, postural dysfunction, and pain.   ACTIVITY LIMITATIONS carrying, lifting, bending, standing, squatting, stairs, transfers, locomotion level, and caring for others  PARTICIPATION LIMITATIONS: meal prep, cleaning, laundry, shopping, community activity, and yard work  PERSONAL FACTORS Age, Time since onset of injury/illness/exacerbation, and 3+ comorbidities: Hx L TKA, HTN, osteoporosis, HLD, hx TB  are also affecting patient's functional outcome.   REHAB POTENTIAL: Good  CLINICAL DECISION MAKING: Stable/uncomplicated  EVALUATION COMPLEXITY:  Low   GOALS: Goals reviewed with patient? Yes  SHORT TERM GOALS: Target date: 12/27/2021  Patient will be independent with HEP in order to improve functional outcomes. Baseline:  Goal status: MET  2.  Patient will report at least 25% improvement in symptoms for improved quality of life. Baseline:  Goal status: MET   LONG TERM GOALS: Target date: 01/17/2022  Patient will report at least 75% improvement in symptoms for improved quality of life. Baseline:  Goal status: IN PROGRESS  2.  Patient will improve FOTO score to expected outcomes in order to indicate improved tolerance to activity. Baseline: complete next session Goal status: MET  3.  Patient will demonstrate at least 25% improvement in lumbar ROM in all restricted planes for improved ability to move trunk while completing chores. Baseline: see AROM Goal status: IN PROGRESS  4.  Patient will be able to ambulate at least 325 feet in 2MWT in order to demonstrate improved tolerance to activity. Baseline: 265 feet Goal status: MET  5.   Patient will demonstrate grade of 4+/5 MMT grade in all tested musculature as evidence of improved strength to assist with stair ambulation and gait.   Baseline: see MMT Goal status: IN PROGRESS    PLAN: PT FREQUENCY: 2x/week  PT DURATION: 6 weeks a total of 9 weeks treatment added time due to pt having an eye issue and not being able to come to therapy since 12/6  PLANNED INTERVENTIONS: Therapeutic exercises, Therapeutic activity, Neuromuscular re-education, Balance training, Gait training, Patient/Family education, Joint manipulation, Joint mobilization, Stair training, Orthotic/Fit training, DME instructions, Aquatic Therapy, Dry Needling, Electrical stimulation, Spinal manipulation, Spinal mobilization, Cryotherapy, Moist heat, Compression bandaging, scar mobilization, Splintting, Taping, Traction, Ultrasound, Ionotophoresis 66m/ml Dexamethasone, and Manual therapy  PLAN FOR NEXT SESSION:  Continue with extension bias as patient stating improvement with most extension. Core and glute strength, lumbar mobility.  CRayetta Humphrey [phone]

## 2022-02-28 ENCOUNTER — Ambulatory Visit (HOSPITAL_COMMUNITY)
Admission: RE | Admit: 2022-02-28 | Discharge: 2022-02-28 | Disposition: A | Discharge status: Home / Self Care | Payer: Medicare Other | Source: Ambulatory Visit | Attending: Cardiovascular Disease | Admitting: Cardiovascular Disease

## 2022-02-28 DIAGNOSIS — R0989 Other specified symptoms and signs involving the circulatory and respiratory systems: Secondary | ICD-10-CM | POA: Diagnosis not present

## 2022-03-01 ENCOUNTER — Ambulatory Visit (HOSPITAL_COMMUNITY): Payer: Medicare Other | Attending: Neurology | Admitting: Physical Therapy

## 2022-03-01 DIAGNOSIS — R2689 Other abnormalities of gait and mobility: Secondary | ICD-10-CM | POA: Insufficient documentation

## 2022-03-01 DIAGNOSIS — R29898 Other symptoms and signs involving the musculoskeletal system: Secondary | ICD-10-CM | POA: Insufficient documentation

## 2022-03-01 DIAGNOSIS — M6281 Muscle weakness (generalized): Secondary | ICD-10-CM | POA: Diagnosis not present

## 2022-03-01 DIAGNOSIS — M5459 Other low back pain: Secondary | ICD-10-CM | POA: Diagnosis not present

## 2022-03-01 NOTE — Therapy (Signed)
OUTPATIENT PHYSICAL THERAPY THORACOLUMBAR treatment   Patient Name: Cassidy Bennett MRN: [id number] DOB:May 25, 1940, 82 y.o., female Today's Date: 03/01/2022  PT End of Session - 03/01/22 1408     Visit Number 15    Number of Visits 18    Date for PT Re-Evaluation 03/10/22    Authorization Type Primary Medicare Secondary BCBS    Progress Note Due on Visit 18    PT Start Time 1345    PT Stop Time 1425    PT Time Calculation (min) 40 min    Activity Tolerance Patient tolerated treatment well    Behavior During Therapy WFL for tasks assessed/performed             Past Medical History:  Diagnosis Date   Allergy    Complication of anesthesia    History of TB (tuberculosis)    treated 29 plus years ago last cxr 03-15-18  CXR every 5 years   Hypercholesteremia    denies at preop   Hypertension    Osteoarthritis    Osteoporosis    PONV (postoperative nausea and vomiting)    Post-menopausal    Tuberculosis    Past Surgical History:  Procedure Laterality Date   CATARACT EXTRACTION  2018   right eye   COLONOSCOPY  2015   DILATION AND CURETTAGE OF UTERUS  2000   KNEE ARTHROSCOPY  2014   left   LUMBAR DISC SURGERY     SHOULDER SURGERY     right   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   Procedure: TOTAL KNEE ARTHROPLASTY;  Surgeon: Gaynelle Arabian, MD;  Location: [facility];  Service: Orthopedics;  Laterality: Left;   Patient Active Problem List   Diagnosis Date Noted   Osteoarthritis of left knee 08/12/2018   Closed displaced fracture of proximal phalanx of right great toe 01/03/2017   Rotator cuff syndrome of right shoulder 01/03/2017   Hyperlipidemia 09/05/2016   Essential hypertension 08/27/2015   Urine abnormality 03/29/2015   Routine general medical examination at a health care facility 06/26/2013   History of TB (tuberculosis) 06/17/2012   Seasonal allergies 01/22/2008   Osteoporosis 12/13/2006   OA (osteoarthritis) of knee 10/11/2006    PCP: Inda Coke  PA  REFERRING PROVIDER: Alda Berthold, DO  Next apt 03/14/21  REFERRING DIAG: M54.17 (ICD-10-CM) - Lumbosacral radiculopathy M54.41,G89.29 (ICD-10-CM) - Chronic right-sided low back pain with right-sided sciatica   Rationale for Evaluation and Treatment Rehabilitation  THERAPY DIAG:  Muscle weakness (generalized)  Other abnormalities of gait and mobility  Other symptoms and signs involving the musculoskeletal system  Other low back pain  ONSET DATE: January 2023  SUBJECTIVE:  SUBJECTIVE STATEMENT:  Pt states that she is tired, always busy.  Back pain is about the same, she could only walk on the TM for five minutes before she started to have pain down her back today.   PERTINENT HISTORY:  Hx L TKA, HTN, osteoporosis, HLD, hx TB  PAIN:  Are you having pain? Yes: NPRS scale: 5/10 Pain location: low back Pain description: sore Aggravating factors: sleeping, sitting Relieving factors: meds, movement  PATIENT GOALS feel better   OBJECTIVE:   DIAGNOSTIC FINDINGS:  XR 08/13/20 IMPRESSION: 1. Prominent spondylosis and facet hypertrophy at the lumbosacral junction. No acute bony abnormality.  PATIENT SURVEYS:  Foto :  40 01/17/22:  53   POSTURE: rounded shoulders, forward head, decreased lumbar lordosis, and decreased thoracic kyphosis  PALPATION: Grossly hypomobile thoracic and lumbar spine, tender throughout thoracic/lumbar CPA; TTP lower lumbar paraspinals and L glute max  LUMBAR ROM:   Active  A/PROM  eval 01/17/22  Flexion 0% limited *   Extension 75% limited - felt good 60% limited   Right lateral flexion 75% limited * 20% limited   Left lateral flexion 75% limited * 50% limited   Right rotation 50% limited * 20% limited  Left rotation 50% limited * 20% limited    (Blank rows  = not tested) *= pain   LOWER EXTREMITY MMT:    MMT Right eval 11/21/ Left eval 11/21  Hip flexion 4- 5 4- 5  Hip extension 3+ _0 Hip abduction 4- 5 3+ 4+  Hip adduction      Hip internal rotation      Hip external rotation      Knee flexion 4 4+   4 4-  Knee extension 4 5 4+ 5  Ankle dorsiflexion 4- 5 4+ 4+  Ankle plantarflexion      Ankle inversion      Ankle eversion       (Blank rows = not tested)   FUNCTIONAL TESTS:  5 times sit to stand: 12.30 seconds without UE support;   11/21: 7.33  2 minute walk test: 265 feet ;  11/21:  483 ft.   GAIT: Distance walked: 265 feet Assistive device utilized: None Level of assistance: Complete Independence Comments: 2MWT, slight trunk flexion and R shift, slower cadence with gradually increasing LBP and LLE symptoms            Palpation:  Pt RT paraspinal mm are very tight but muscles relax with manual.   TODAY'S TREATMENT 03/01/2022 Prone: Poe x 2 minute Quad stretch x 3 B  Child pose x 3 Child pose stretch to Rt then LT x 2 Supine: Double knee to chest x 3  LTR x 5  Piriformis stretch x 3 B ( Rt knee towards opposite axillary area). Piriformis figure 4  stretch 3 x 30" Sitting : Thoracic excursion Standing : Lumbar excursion  Wall arch x 10   02/24/22 Wall arch x 10 B UE  flexion ITband stretch B x 30" x 3  Functional squat x 10 Wall push up  Sitting : Piriformis stretch x 2  Sit to stand Thoracic excursion x 3  Prone:  B shoulder extension x 10 W back x 10 Hip extension x 10 Opposite arm/leg x 10   POE x 1 minute Supine: Bridge x 15 Knee to chest 3 x 30"    01/31/22 Manual while pt was prone with noted mal alignment of SI.  Therapist used contract relax technique to  properly align SI joint.  Therapist passively stretched B quadriceps which are very tight. RT goes to 90; Lt to about 110, PROM for B hip IR/ER and hamstring mm.   01/27/22: Standing: Warrior I x 5 B Warrior II x 5 B Tree pose  x 2 B Hip excursion x 3  Quadriped: Piriformis stretch 1 x 30" bothers Rt knee  Mad cat/old horse x 5 Assisted opposite arm/leg x 5 Supine : Piriformis stretch hold 20" x 5 B Bridge with adduction x 15 hold 5" Dead bug x 10   Manual to decrease tension and improve motion in low back  01/24/22            Wall arch x 10            Side stepping with blue theraband x 2 RT            Standing hip abduction with blue theraband x 10 B             Standing hip extension with blue theraband x 10 B            Wall push up x 10             Pallof x 10 B             Wall slide x 10            T band postural exercises:            Scapular retraction x 10            Row x 10            Shoulder extension x 10            Supine:            Knee to chest x 10            Active hamstring stretch 3  x 30"             Quadriped             Opposite arm/leg raise x 10              Sit to stand x 15               3 D excursion x 3  01/17/22:  Reassessment Prone  Prone on elbow x 2 minutes with deep breathing  Press up x 10  Prone opposite arm/leg raise x 10  Sitting: Sit to stand x 10   PATIENT EDUCATION:  Education details: Patient educated on exam findings, POC, scope of PT, HEP, and continuing to exercise. Person educated: Patient Education method: Explanation, Demonstration, and Handouts Education comprehension: verbalized understanding, returned demonstration, verbal cues required, and tactile cues required   HOME EXERCISE PROGRAM: 02/24/22 - Prone Hip Extension  - 1 x daily - 7 x weekly - 1 sets - 10 reps - 3-5" hold - Prone Alternating Arm and Leg Lifts  - 1 x daily - 7 x weekly - 1 sets - 10 reps - 3-5" hold - Prone Scapular Slide with Shoulder Extension  - 1 x daily - 7 x weekly - 1 sets - 10 reps - 3-5" hold - Prone W Scapular Retraction  - 1 x daily - 7 x weekly - 1 sets - 10 reps - 3-5" hold 01/24/22 - Side Stepping with Resistance at Thighs and Counter Support  - 1 x  daily -  7 x weekly - 1 sets - 10 reps - Squatting Anti-Rotation Press  - 1 x daily - 7 x weekly - 1 sets - 10 reps - 5" hold - Standing Hip Extension with Resistance at Ankles and Counter Support  - 1 x daily - 7 x weekly - 1 sets - 10 reps - 5" hold - Diagonal Hip Extension with Resistance  - 1 x daily - 7 x weekly - 1 sets - 10 reps - 5" hold Access Code: [email or web address] URL: [email or web address] Date: 01/12/2022 Prepared by: Roseanne Reno Exercises - Seated Piriformis Stretch with Trunk Bend  - 2 x daily - 7 x weekly - 1 sets - 3 reps - 30 sec hold - Seated Hamstring Stretch  - 2 x daily - 7 x weekly - 1 sets - 3 reps - 30 sec hold  01/10/22: vector stance               12/27/21              - Standing Row with Resistance with Anchored Resistance at Chest Height Palms Down  - 1 x daily - 7 x weekly - 1 sets - 10 reps - 3-5" hold - Standing Row with Anchored Resistance  - 1 x daily - 7 x weekly - 1 sets - 10 reps - 3-5"  hold - Single Arm Shoulder Extension with Anchored Resistance  - 1 x daily - 7 x weekly - 1 sets - 10 reps - 3-5"  hold  Access Code: [email or web address] 12/21/21 - Supine Transversus Abdominis Bracing - Hands on Stomach  - 2 x daily - 7 x weekly - 1 sets - 10 reps - 5 second hold - Supine March  - 2 x daily - 7 x weekly - 2 sets - 10 reps - Small Range Straight Leg Raise  - 2 x daily - 7 x weekly - 2 sets - 10 reps  12/15/21 - Prone Gluteal Sets  - 2 x daily - 7 x weekly - 1 sets - 10 reps - Prone Heel Squeeze  - 2 x daily - 7 x weekly - 1 sets - 10 reps - 5" hold - Supine Single Knee to Chest Stretch  - 2 x daily - 7 x weekly - 1 sets - 10 reps - 20" hold - Supine Hamstring Stretch  - 2 x daily - 7 x weekly - 1 sets - 10 reps - 20" hold Date: 12/06/2021 - Supine Bridge  - 2-3 x daily - 7 x weekly - 2-3 sets - 10 reps - Clamshell  - 2-3 x daily - 7 x weekly - 2-3 sets - 10 reps - Prone Press Up On Elbows  - 2-3 x daily - 7 x weekly - 5 reps - 20 second hold - Standing  Lumbar Extension  - 2-3 x daily - 7 x weekly - 2 sets - 10 reps  ASSESSMENT:  CLINICAL IMPRESSION: Pt treatment concentrated on stretching as therapist noted that paraspinal mm in both thoracic and lumbar area were very tight.  PT vocalized decreased pain level upon leaving.    Continue with core strengthening and stretching to improve function.  OBJECTIVE IMPAIRMENTS Abnormal gait, decreased activity tolerance, decreased balance, decreased endurance, decreased mobility, difficulty walking, decreased ROM, decreased strength, increased muscle spasms, impaired flexibility, improper body mechanics, postural dysfunction, and pain.   ACTIVITY LIMITATIONS carrying, lifting, bending, standing, squatting, stairs, transfers, locomotion level, and caring for others  PARTICIPATION LIMITATIONS: meal prep,  cleaning, laundry, shopping, community activity, and yard work  PERSONAL FACTORS Age, Time since onset of injury/illness/exacerbation, and 3+ comorbidities: Hx L TKA, HTN, osteoporosis, HLD, hx TB  are also affecting patient's functional outcome.   REHAB POTENTIAL: Good  CLINICAL DECISION MAKING: Stable/uncomplicated  EVALUATION COMPLEXITY: Low   GOALS: Goals reviewed with patient? Yes  SHORT TERM GOALS: Target date: 12/27/2021  Patient will be independent with HEP in order to improve functional outcomes. Baseline:  Goal status: MET  2.  Patient will report at least 25% improvement in symptoms for improved quality of life. Baseline:  Goal status: MET   LONG TERM GOALS: Target date: 01/17/2022  Patient will report at least 75% improvement in symptoms for improved quality of life. Baseline:  Goal status: IN PROGRESS  2.  Patient will improve FOTO score to expected outcomes in order to indicate improved tolerance to activity. Baseline: complete next session Goal status: MET  3.  Patient will demonstrate at least 25% improvement in lumbar ROM in all restricted planes for improved  ability to move trunk while completing chores. Baseline: see AROM Goal status: IN PROGRESS  4.  Patient will be able to ambulate at least 325 feet in 2MWT in order to demonstrate improved tolerance to activity. Baseline: 265 feet Goal status: MET  5.  Patient will demonstrate grade of 4+/5 MMT grade in all tested musculature as evidence of improved strength to assist with stair ambulation and gait.   Baseline: see MMT Goal status: IN PROGRESS    PLAN: PT FREQUENCY: 2x/week  PT DURATION: 6 weeks a total of 9 weeks treatment added time due to pt having an eye issue and not being able to come to therapy since 12/6  PLANNED INTERVENTIONS: Therapeutic exercises, Therapeutic activity, Neuromuscular re-education, Balance training, Gait training, Patient/Family education, Joint manipulation, Joint mobilization, Stair training, Orthotic/Fit training, DME instructions, Aquatic Therapy, Dry Needling, Electrical stimulation, Spinal manipulation, Spinal mobilization, Cryotherapy, Moist heat, Compression bandaging, scar mobilization, Splintting, Taping, Traction, Ultrasound, Ionotophoresis 77m/ml Dexamethasone, and Manual therapy  PLAN FOR NEXT SESSION:  Continue with extension bias as patient stating improvement with most extension. Stretching and Core and glute strength, lumbar mobility.  CRayetta Humphrey PWatts MillsCLT [phone]

## 2022-03-03 ENCOUNTER — Encounter (HOSPITAL_COMMUNITY): Payer: Medicare Other | Admitting: Physical Therapy

## 2022-03-09 ENCOUNTER — Ambulatory Visit (HOSPITAL_COMMUNITY): Payer: Medicare Other | Attending: Cardiology

## 2022-03-09 DIAGNOSIS — R011 Cardiac murmur, unspecified: Secondary | ICD-10-CM | POA: Diagnosis not present

## 2022-03-09 LAB — ECHOCARDIOGRAM COMPLETE
AV Mean grad: 9 mmHg
AV Peak grad: 14.4 mmHg
Ao pk vel: 1.9 m/s
Area-P 1/2: 2.78 cm2
S' Lateral: 1.6 cm

## 2022-03-14 ENCOUNTER — Encounter: Payer: Self-pay | Admitting: Neurology

## 2022-03-14 ENCOUNTER — Ambulatory Visit (INDEPENDENT_AMBULATORY_CARE_PROVIDER_SITE_OTHER): Payer: Medicare Other | Admitting: Neurology

## 2022-03-14 VITALS — BP 145/75 | HR 83 | Ht 62.0 in | Wt 114.0 lb

## 2022-03-14 DIAGNOSIS — M5441 Lumbago with sciatica, right side: Secondary | ICD-10-CM

## 2022-03-14 DIAGNOSIS — G8929 Other chronic pain: Secondary | ICD-10-CM

## 2022-03-14 DIAGNOSIS — M5417 Radiculopathy, lumbosacral region: Secondary | ICD-10-CM | POA: Diagnosis not present

## 2022-03-14 MED ORDER — GABAPENTIN 100 MG PO CAPS
ORAL_CAPSULE | ORAL | 3 refills | Status: DC
Start: 2022-03-14 — End: 2022-07-31

## 2022-03-14 NOTE — Patient Instructions (Signed)
It was great to see you today  Increase gabapentin to 200mg  at bedtime  Continue home stretching exercises  Please come back and see me if your symptoms get worse

## 2022-03-14 NOTE — Progress Notes (Signed)
Follow-up Visit   Date: 03/14/2022    Cassidy Bennett MRN: [id number] DOB: 15-Jun-1940    Cassidy Bennett is a 82 y.o. female with hyperlipidemia and OA returning to the clinic for follow-up of left foot numbness/tingling.  The patient was accompanied to the clinic by husband who also provides collateral information.     IMPRESSION/PLAN: Right side low back pain with sciatica, improved with PT - Continue tizanidine 2mg  at bedtime as needed  2.  Left leg radicular pain and paresthesias with residual tingling involving the toes.  ?L5-S1 nerve root impingement, improved with PT - Increase gabapentin to 200mg  at bedtime - Continue home exercises - If symptoms get worse, next step is MRI lumbar  Return to clinic as needed  --------------------------------------------- History of present illness: Starting around January 2023, she started having numbness/tingling involving the toes and balls of the left foot.  Symptoms occur intermittently and worse with prolonged standing.  Rest tends to alleviate symptoms.  She has mild low back pain.  She also had tingling over the left lateral leg from the knee down, it is worse with localized pressure around the anterior knee and started after having right TKA two years ago.    UPDATE 11/07/2021: She is here for follow-up.  She has not completed PT as therapy did not contact her for appointment. Overall, she has noticed that tingling in the left leg is less, it remains in the left toe.  Over the past several weeks, she has been having more right sided back pain radiating in to the leg.  She has taken gabapentin 100mg  at bedtime and meloxicam.  She became very sedated on higher dose of gabapentin.  No numbness/tingling in the right leg.   UPDATE 03/14/2022:  She is here for follow-up visit.  She has completed PT and feels that it has helped her symptoms significantly.  However, when she exerts herself with prolonged walking, she continues to  have intermittent tingling of the left toes.  She takes gabapentin 100-200mg  at bedtime which provides some relief.  No new symptoms.  Medications:  Current Outpatient Medications on File Prior to Visit  Medication Sig Dispense Refill   amLODipine (NORVASC) 5 MG tablet Take 1 tablet (5 mg total) by mouth daily. 90 tablet 1   amoxicillin (AMOXIL) 875 MG tablet Take 875 mg by mouth 2 (two) times daily.     Ascorbic Acid (VITAMIN C PO) Take 1,000 mg by mouth daily.      b complex vitamins tablet Take 1 tablet by mouth daily. Every other day     Calcium Carb-Cholecalciferol (CALCIUM 600 + D PO) Take 1 tablet by mouth daily.     magnesium 30 MG tablet Take 30 mg by mouth 3 (three) times a week.     polyethylene glycol (MIRALAX / GLYCOLAX) packet Take 17 g by mouth daily as needed for moderate constipation.      Vitamin A 2400 MCG (8000 UT) TABS Take 8,000 Units by mouth daily.      vitamin B-12 (CYANOCOBALAMIN) 1000 MCG tablet Take 1,000 mcg by mouth daily.     vitamin E 400 UNIT capsule Take 400 Units by mouth daily.     No current facility-administered medications on file prior to visit.    Allergies:  Allergies  Allergen Reactions   Metoprolol Nausea And Vomiting   Morphine And Related Nausea And Vomiting   Other Nausea And Vomiting    Vital Signs:  BP (!) 145/75  Pulse 83   Ht 5\' 2"  (1.575 m)   Wt 114 lb (51.7 kg)   SpO2 98%   BMI 20.85 kg/m    Neurological Exam: MENTAL STATUS including orientation to time, place, person, recent and remote memory, attention span and concentration, language, and fund of knowledge is normal.  Speech is not dysarthric.  CRANIAL NERVES:  Pupils equal round and reactive to light.  Normal conjugate, extra-ocular eye movements in all directions of gaze.  No ptosis.  Face is symmetric.  MOTOR:  Motor strength is 5/5 in all extremities.  No atrophy, fasciculations or abnormal movements.  No pronator drift.  Tone is normal.  Tenderness to palpation  over the right SI joint.  MSRs:  Reflexes are 2+/4 throughout.  SENSORY:  Intact to vibration throughout.  COORDINATION/GAIT:   Gait appears mildly-wide based, unassisted, stable.  Data: n/a    Thank you for allowing me to participate in patient's care.  If I can answer any additional questions, I would be pleased to do so.    Sincerely,    Adisyn Ruscitti K. Posey Pronto, DO

## 2022-03-17 ENCOUNTER — Ambulatory Visit: Payer: Medicare Other | Admitting: Neurology

## 2022-03-28 ENCOUNTER — Encounter: Payer: Self-pay | Admitting: Physician Assistant

## 2022-03-31 ENCOUNTER — Inpatient Hospital Stay: Admission: RE | Admit: 2022-03-31 | Payer: Medicare Other | Source: Ambulatory Visit

## 2022-04-03 ENCOUNTER — Telehealth: Payer: Self-pay | Admitting: Physician Assistant

## 2022-04-03 ENCOUNTER — Other Ambulatory Visit (HOSPITAL_COMMUNITY): Payer: Medicare Other

## 2022-04-03 DIAGNOSIS — I10 Essential (primary) hypertension: Secondary | ICD-10-CM

## 2022-04-03 MED ORDER — AMLODIPINE BESYLATE 5 MG PO TABS: 5.0000 mg | ORAL_TABLET | Freq: Every day | ORAL | 1 refills | Status: DC

## 2022-04-03 NOTE — Telephone Encounter (Signed)
  Encourage patient to contact the pharmacy for refills or they can request refills through [place] [facility]:  Please schedule appointment if longer than 1 year  NEXT APPOINTMENT DATE:  MEDICATION:  amLODipine (NORVASC) 5 MG tablet   Is the patient out of medication?   PHARMACY:  [place], [place] Phone: [phone]  Fax: [phone]      Let patient know to contact pharmacy at the end of the day to make sure medication is ready.  Please notify patient to allow 48-72 hours to process

## 2022-04-03 NOTE — Telephone Encounter (Signed)
Pt notified Rx sent to the pharmacy.

## 2022-04-10 ENCOUNTER — Ambulatory Visit (INDEPENDENT_AMBULATORY_CARE_PROVIDER_SITE_OTHER): Payer: Medicare Other

## 2022-04-10 VITALS — Wt 112.0 lb

## 2022-04-10 DIAGNOSIS — Z0000 Encounter for general adult medical examination without abnormal findings: Secondary | ICD-10-CM | POA: Diagnosis not present

## 2022-04-10 NOTE — Patient Instructions (Signed)
Cassidy Bennett , Thank you for taking time to come for your Medicare Wellness Visit. I appreciate your ongoing commitment to your health goals. Please review the following plan we discussed and let me know if I can assist you in the future.   These are the goals we discussed:  Goals      Patient Stated     Continue staying physically and mentally active, engaged with your volunteer work!      Patient Stated     Patient will stay healthy     Patient Stated     More exercise      Patient Stated     Stay healthy         This is a list of the screening recommended for you and due dates:  Health Maintenance  Topic Date Due   COVID-19 Vaccine (9 - 2023-24 season) 01/11/2022   Medicare Annual Wellness Visit  04/11/2023   DTaP/Tdap/Td vaccine (6 - Td or Tdap) 05/05/2027   Pneumonia Vaccine  Completed   Flu Shot  Completed   DEXA scan (bone density measurement)  Completed   Zoster (Shingles) Vaccine  Completed   HPV Vaccine  Aged Out    Advanced directives: Please bring a copy of your health care power of attorney and living will to the office at your convenience.  Conditions/risks identified: stay healthy   Next appointment: Follow up in one year for your annual wellness visit    Preventive Care 65 Years and Older, Female Preventive care refers to lifestyle choices and visits with your health care provider that can promote health and wellness. What does preventive care include? A yearly physical exam. This is also called an annual well check. Dental exams once or twice a year. Routine eye exams. Ask your health care provider how often you should have your eyes checked. Personal lifestyle choices, including: Daily care of your teeth and gums. Regular physical activity. Eating a healthy diet. Avoiding tobacco and drug use. Limiting alcohol use. Practicing safe sex. Taking low-dose aspirin every day. Taking vitamin and mineral supplements as recommended by your health care  provider. What happens during an annual well check? The services and screenings done by your health care provider during your annual well check will depend on your age, overall health, lifestyle risk factors, and family history of disease. Counseling  Your health care provider may ask you questions about your: Alcohol use. Tobacco use. Drug use. Emotional well-being. Home and relationship well-being. Sexual activity. Eating habits. History of falls. Memory and ability to understand (cognition). Work and work Statistician. Reproductive health. Screening  You may have the following tests or measurements: Height, weight, and BMI. Blood pressure. Lipid and cholesterol levels. These may be checked every 5 years, or more frequently if you are over 8 years old. Skin check. Lung cancer screening. You may have this screening every year starting at age 9 if you have a 30-pack-year history of smoking and currently smoke or have quit within the past 15 years. Fecal occult blood test (FOBT) of the stool. You may have this test every year starting at age 45. Flexible sigmoidoscopy or colonoscopy. You may have a sigmoidoscopy every 5 years or a colonoscopy every 10 years starting at age 60. Hepatitis C blood test. Hepatitis B blood test. Sexually transmitted disease (STD) testing. Diabetes screening. This is done by checking your blood sugar (glucose) after you have not eaten for a while (fasting). You may have this done every 1-3 years. Bone  density scan. This is done to screen for osteoporosis. You may have this done starting at age 4. Mammogram. This may be done every 1-2 years. Talk to your health care provider about how often you should have regular mammograms. Talk with your health care provider about your test results, treatment options, and if necessary, the need for more tests. Vaccines  Your health care provider may recommend certain vaccines, such as: Influenza vaccine. This is  recommended every year. Tetanus, diphtheria, and acellular pertussis (Tdap, Td) vaccine. You may need a Td booster every 10 years. Zoster vaccine. You may need this after age 27. Pneumococcal 13-valent conjugate (PCV13) vaccine. One dose is recommended after age 67. Pneumococcal polysaccharide (PPSV23) vaccine. One dose is recommended after age 37. Talk to your health care provider about which screenings and vaccines you need and how often you need them. This information is not intended to replace advice given to you by your health care provider. Make sure you discuss any questions you have with your health care provider. Document Released: 03/12/2015 Document Revised: 11/03/2015 Document Reviewed: 12/15/2014 Elsevier Interactive Patient Education  2017 [place] Prevention in the Home Falls can cause injuries. They can happen to people of all ages. There are many things you can do to make your home safe and to help prevent falls. What can I do on the outside of my home? Regularly fix the edges of walkways and driveways and fix any cracks. Remove anything that might make you trip as you walk through a door, such as a raised step or threshold. Trim any bushes or trees on the path to your home. Use bright outdoor lighting. Clear any walking paths of anything that might make someone trip, such as rocks or tools. Regularly check to see if handrails are loose or broken. Make sure that both sides of any steps have handrails. Any raised decks and porches should have guardrails on the edges. Have any leaves, snow, or ice cleared regularly. Use sand or salt on walking paths during winter. Clean up any spills in your garage right away. This includes oil or grease spills. What can I do in the bathroom? Use night lights. Install grab bars by the toilet and in the tub and shower. Do not use towel bars as grab bars. Use non-skid mats or decals in the tub or shower. If you need to sit down in  the shower, use a plastic, non-slip stool. Keep the floor dry. Clean up any water that spills on the floor as soon as it happens. Remove soap buildup in the tub or shower regularly. Attach bath mats securely with double-sided non-slip rug tape. Do not have throw rugs and other things on the floor that can make you trip. What can I do in the bedroom? Use night lights. Make sure that you have a light by your bed that is easy to reach. Do not use any sheets or blankets that are too big for your bed. They should not hang down onto the floor. Have a firm chair that has side arms. You can use this for support while you get dressed. Do not have throw rugs and other things on the floor that can make you trip. What can I do in the kitchen? Clean up any spills right away. Avoid walking on wet floors. Keep items that you use a lot in easy-to-reach places. If you need to reach something above you, use a strong step stool that has a grab bar. Keep  electrical cords out of the way. Do not use floor polish or wax that makes floors slippery. If you must use wax, use non-skid floor wax. Do not have throw rugs and other things on the floor that can make you trip. What can I do with my stairs? Do not leave any items on the stairs. Make sure that there are handrails on both sides of the stairs and use them. Fix handrails that are broken or loose. Make sure that handrails are as long as the stairways. Check any carpeting to make sure that it is firmly attached to the stairs. Fix any carpet that is loose or worn. Avoid having throw rugs at the top or bottom of the stairs. If you do have throw rugs, attach them to the floor with carpet tape. Make sure that you have a light switch at the top of the stairs and the bottom of the stairs. If you do not have them, ask someone to add them for you. What else can I do to help prevent falls? Wear shoes that: Do not have high heels. Have rubber bottoms. Are comfortable  and fit you well. Are closed at the toe. Do not wear sandals. If you use a stepladder: Make sure that it is fully opened. Do not climb a closed stepladder. Make sure that both sides of the stepladder are locked into place. Ask someone to hold it for you, if possible. Clearly mark and make sure that you can see: Any grab bars or handrails. First and last steps. Where the edge of each step is. Use tools that help you move around (mobility aids) if they are needed. These include: Canes. Walkers. Scooters. Crutches. Turn on the lights when you go into a dark area. Replace any light bulbs as soon as they burn out. Set up your furniture so you have a clear path. Avoid moving your furniture around. If any of your floors are uneven, fix them. If there are any pets around you, be aware of where they are. Review your medicines with your doctor. Some medicines can make you feel dizzy. This can increase your chance of falling. Ask your doctor what other things that you can do to help prevent falls. This information is not intended to replace advice given to you by your health care provider. Make sure you discuss any questions you have with your health care provider. Document Released: 12/10/2008 Document Revised: 07/22/2015 Document Reviewed: 03/20/2014 Elsevier Interactive Patient Education  2017 [employer].

## 2022-04-10 NOTE — Progress Notes (Signed)
I connected with  Cassidy Bennett on 04/10/22 by a audio enabled telemedicine application and verified that I am speaking with the correct person using two identifiers.  Patient Location: Home  Provider Location: Office/Clinic  I discussed the limitations of evaluation and management by telemedicine. The patient expressed understanding and agreed to proceed.  Subjective:   Cassidy Bennett is a 82 y.o. female who presents for Medicare Annual (Subsequent) preventive examination.  Review of Systems     Cardiac Risk Factors include: advanced age (>10mn, >>56women);dyslipidemia;hypertension     Objective:    Today's Vitals   04/10/22 0903  Weight: 112 lb (50.8 kg)   Body mass index is 20.49 kg/m.     04/10/2022    9:08 AM 03/14/2022    1:29 PM 12/06/2021    8:29 AM 11/07/2021    9:37 AM 05/16/2021    2:58 PM 09/14/2020    1:22 PM 09/10/2019    1:24 PM  Advanced Directives  Does Patient Have a Medical Advance Directive? Yes Yes Yes Yes Yes Yes Yes  Type of AParamedicof [place] will [place] will;Out of facility DNR (pink MOST or yellow form)  [place];Out of facility DNR (pink MOST or yellow form)  Living will Living will  Does patient want to make changes to medical advance directive?   No - Patient declined      Copy of [place] Chart? No - copy requested          Current Medications (verified) Outpatient Encounter Medications as of 04/10/2022  Medication Sig   amLODipine (NORVASC) 5 MG tablet Take 1 tablet (5 mg total) by mouth daily.   Ascorbic Acid (VITAMIN C PO) Take 1,000 mg by mouth daily.    b complex vitamins tablet Take 1 tablet by mouth daily. Every other day   Calcium Carb-Cholecalciferol (CALCIUM 600 + D PO) Take 1 tablet by mouth daily.   gabapentin (NEURONTIN) 100 MG capsule Take 2 tablets at bedtime.   magnesium 30 MG tablet Take 30 mg by  mouth 3 (three) times a week.   polyethylene glycol (MIRALAX / GLYCOLAX) packet Take 17 g by mouth daily as needed for moderate constipation.    Vitamin A 2400 MCG (8000 UT) TABS Take 8,000 Units by mouth daily.    vitamin B-12 (CYANOCOBALAMIN) 1000 MCG tablet Take 1,000 mcg by mouth daily.   vitamin E 400 UNIT capsule Take 400 Units by mouth daily.   [DISCONTINUED] amoxicillin (AMOXIL) 875 MG tablet Take 875 mg by mouth 2 (two) times daily.   No facility-administered encounter medications on file as of 04/10/2022.    Allergies (verified) Metoprolol, Morphine and related, and Other   History: Past Medical History:  Diagnosis Date   Allergy    Complication of anesthesia    History of TB (tuberculosis)    treated 418plus years ago last cxr 03-15-18  CXR every 5 years   Hypercholesteremia    denies at preop   Hypertension    Osteoarthritis    Osteoporosis    PONV (postoperative nausea and vomiting)    Post-menopausal    Tuberculosis    Past Surgical History:  Procedure Laterality Date   CATARACT EXTRACTION  2018   right eye   COLONOSCOPY  2015   DILATION AND CURETTAGE OF UTERUS  2000   KNEE ARTHROSCOPY  2014   left   LUMBAR DISC SURGERY  SHOULDER SURGERY     right   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   Procedure: TOTAL KNEE ARTHROPLASTY;  Surgeon: Gaynelle Arabian, MD;  Location: [facility];  Service: Orthopedics;  Laterality: Left;   Family History  Problem Relation Age of Onset   Other Mother        malaria   Other Father    Cancer Other        stomach   Breast cancer Neg Hx    Social History   Socioeconomic History   Marital status: Married    Spouse name: Not on file   Number of children: 3   Years of education: Not on file   Highest education level: Not on file  Occupational History   Occupation: Retired school principal  Tobacco Use   Smoking status: Never   Smokeless tobacco: Never  Vaping Use   Vaping Use: Never used  Substance and Sexual  Activity   Alcohol use: Not Currently    Alcohol/week: 5.0 standard drinks of alcohol    Types: 5 Glasses of wine per week   Drug use: No   Sexual activity: Not Currently  Other Topics Concern   Not on file  Social History Narrative   ** Merged History Encounter ** Lives with husband in two-story home, has 3 sons and 5 grandchildren in [place] area      Active with volunteering at school.    Enjoys gardening and using stationary bike to cycle   Right handed   Drinks caffeine   Two story home      Social Determinants of Health   Financial Resource Strain: Low Risk  (04/10/2022)   Overall Financial Resource Strain (CARDIA)    Difficulty of Paying Living Expenses: Not hard at all  Food Insecurity: No Food Insecurity (04/10/2022)   Hunger Vital Sign    Worried About Running Out of Food in the Last Year: Never true    Ran Out of Food in the Last Year: Never true  Transportation Needs: No Transportation Needs (04/10/2022)   PRAPARE - Hydrologist (Medical): No    Lack of Transportation (Non-Medical): No  Physical Activity: Sufficiently Active (04/10/2022)   Exercise Vital Sign    Days of Exercise per Week: 5 days    Minutes of Exercise per Session: 30 min  Stress: No Stress Concern Present (04/10/2022)   [place]    Feeling of Stress : Not at all  Social Connections: [place] (04/10/2022)   Social Connection and Isolation Panel [NHANES]    Frequency of Communication with Friends and Family: More than three times a week    Frequency of Social Gatherings with Friends and Family: More than three times a week    Attends Religious Services: More than 4 times per year    Active Member of [employer] or Organizations: Yes    Attends Archivist Meetings: 1 to 4 times per year    Marital Status: Married    Tobacco Counseling Counseling given: Not Answered   Clinical  Intake:  Pre-visit preparation completed: Yes  Pain : No/denies pain     BMI - recorded: 20.49 Nutritional Status: BMI of 19-24  Normal Nutritional Risks: None Diabetes: No  How often do you need to have someone help you when you read instructions, pamphlets, or other written materials from your doctor or pharmacy?: 1 - Never  Diabetic?no  Interpreter Needed?: No  Information entered  by :: Charlott Rakes, LPN   Activities of Daily Living    04/10/2022    9:09 AM  In your present state of health, do you have any difficulty performing the following activities:  Hearing? 0  Vision? 0  Difficulty concentrating or making decisions? 0  Walking or climbing stairs? 0  Dressing or bathing? 0  Doing errands, shopping? 0  Preparing Food and eating ? N  Using the Toilet? N  In the past six months, have you accidently leaked urine? N  Do you have problems with loss of bowel control? N  Managing your Medications? N  Managing your Finances? N  Housekeeping or managing your Housekeeping? N    Patient Care Team: Inda Coke, [place] as PCP - General (Physician Assistant) Shon Hough, MD as Consulting Physician (Ophthalmology) Alda Berthold, DO as Consulting Physician (Neurology)  Indicate any recent Medical Services you may have received from other than [facility] providers in the past year (date may be approximate).     Assessment:   This is a routine wellness examination for [place].  Hearing/Vision screen Hearing Screening - Comments:: Pt denies any hearing issues  Vision Screening - Comments:: Pt [facility] with [facility] opthalmology for annual eye exams   Dietary issues and exercise activities discussed: Current Exercise Habits: Home exercise routine, Type of exercise: Other - see comments, Time (Minutes): 30, Frequency (Times/Week): 5, Weekly Exercise (Minutes/Week): 150   Goals Addressed             This Visit's Progress    Patient Stated       Stay  healthy        Depression Screen    04/10/2022    9:06 AM 10/14/2021    1:06 PM 08/08/2021    2:40 PM 09/14/2020    1:20 PM 08/13/2020   10:19 AM 09/10/2019    1:22 PM 03/13/2018    2:17 PM  PHQ 2/9 Scores  PHQ - 2 Score 0 0 0 0 2 0 0  PHQ- 9 Score     3  0    Fall Risk    04/10/2022    9:09 AM 03/14/2022    1:29 PM 11/07/2021    9:37 AM 10/14/2021    1:06 PM 08/08/2021    2:40 PM  [place] in the past year? 0 1 0 0 0  Number falls in past yr: 0 0 0 0 0  Injury with Fall? 0 0 0 0 0  Risk for fall due to : Impaired vision    No Fall Risks  Follow up Falls prevention discussed Falls evaluation completed  Falls evaluation completed     FALL RISK PREVENTION PERTAINING TO THE HOME:  Any stairs in or around the home? Yes  If so, are there any without handrails? No  Home free of loose throw rugs in walkways, pet beds, electrical cords, etc? Yes  Adequate lighting in your home to reduce risk of falls? Yes   ASSISTIVE DEVICES UTILIZED TO PREVENT FALLS:  Life alert? No  Use of a cane, walker or w/c? No  Grab bars in the bathroom? No  Shower chair or bench in shower? Yes  Elevated toilet seat or a handicapped toilet? No   TIMED UP AND GO:  Was the test performed? No .  Cognitive Function:        04/10/2022    9:09 AM 09/14/2020    1:26 PM 09/10/2019    1:30 PM  6CIT Screen  What Year? 0 points 0 points 0 points  What month? 0 points 0 points 0 points  What time? 0 points 0 points 0 points  Count back from 20 0 points 0 points 0 points  Months in reverse 0 points 0 points 0 points  Repeat phrase 0 points 0 points 0 points  Total Score 0 points 0 points 0 points    Immunizations Immunization History  Administered Date(s) Administered   Fluad Quad(high Dose 65+) 11/27/2018, 11/21/2019   H1N1 02/09/2008   Hepatitis A 05/04/2017, 09/07/2017   Hepatitis A, Adult 05/03/2017   Influenza Inj Mdck Quad With Preservative 12/27/2017   Influenza Split 11/16/2010    Influenza Whole 12/13/2006, 02/25/2009   Influenza, High Dose Seasonal PF 10/28/2017, 12/15/2021   Influenza,inj,quad, With Preservative 12/26/2016, 12/27/2017   Influenza-Unspecified 12/29/2014, 11/09/2016, 12/26/2016, 12/27/2016   Moderna Covid Bivalent Peds Booster(51moThru 561yr 01/25/2021   Moderna Sars-Covid-2 Vaccination 03/11/2019, 04/25/2019, 06/01/2020   PFIZER(Purple Top)SARS-COV-2 Vaccination 10/25/2019, 11/16/2021   Pfizer Covid-19 Vaccine Bivalent Booster 1232yr up 11/09/2020, 06/16/2021   Pneumococcal Conjugate-13 06/26/2013   Pneumococcal Polysaccharide-23 02/27/2005, 06/17/2012   Td 02/27/1997, 01/22/2008, 05/04/2017   Tdap 09/07/2016, 05/04/2017   Zoster Recombinat (Shingrix) 10/10/2019, 12/11/2019    TDAP status: Up to date  Flu Vaccine status: Up to date  Pneumococcal vaccine status: Up to date  Covid-19 vaccine status: Completed vaccines  Qualifies for Shingles Vaccine? Yes   Zostavax completed Yes   Shingrix Completed?: Yes  Screening Tests Health Maintenance  Topic Date Due   COVID-19 Vaccine (9 - 2023-24 season) 01/11/2022   Medicare Annual Wellness (AWV)  04/11/2023   DTaP/Tdap/Td (6 - Td or Tdap) 05/05/2027   Pneumonia Vaccine 65+67ears old  Completed   INFLUENZA VACCINE  Completed   DEXA SCAN  Completed   Zoster Vaccines- Shingrix  Completed   HPV VACCINES  Aged Out    Health Maintenance  Health Maintenance Due  Topic Date Due   COVID-19 Vaccine (9 - 2023-24 season) 01/11/2022    Colorectal cancer screening: No longer required.   Mammogram status: Completed 09/07/21. Repeat every year  Bone Density status: Completed 03/07/19. Results reflect: Bone density results: OSTEOPOROSIS. Repeat every 3 years. Pt scheduled 08/28/22  Additional Screening:  Vision Screening: Recommended annual ophthalmology exams for early detection of glaucoma and other disorders of the eye. Is the patient up to date with their annual eye exam?  Yes  Who is the  provider or what is the name of the [facility] which the patient [facility]  If pt is not established with a provider, would they like to be referred to a provider to establish care? No .   Dental Screening: Recommended annual dental exams for proper oral hygiene  Community Resource Referral / Chronic Care Management: CRR required this visit?  No   CCM required this visit?  No      Plan:     I have personally reviewed and noted the following in the patient's chart:   Medical and social history Use of alcohol, tobacco or illicit drugs  Current medications and supplements including opioid prescriptions. Patient is not currently taking opioid prescriptions. Functional ability and status Nutritional status Physical activity Advanced directives List of other physicians Hospitalizations, surgeries, and ER visits in previous 12 months Vitals Screenings to include cognitive, depression, and falls Referrals and appointments  In addition, I have reviewed and discussed with patient certain preventive protocols, quality metrics, and  best practice recommendations. A written personalized care plan for preventive services as well as general preventive health recommendations were provided to patient.     Willette Brace, LPN   [id number]   Nurse Notes: none

## 2022-05-06 DIAGNOSIS — Z23 Encounter for immunization: Secondary | ICD-10-CM | POA: Diagnosis not present

## 2022-06-20 DIAGNOSIS — H26493 Other secondary cataract, bilateral: Secondary | ICD-10-CM | POA: Diagnosis not present

## 2022-06-20 DIAGNOSIS — Z961 Presence of intraocular lens: Secondary | ICD-10-CM | POA: Diagnosis not present

## 2022-06-20 DIAGNOSIS — H524 Presbyopia: Secondary | ICD-10-CM | POA: Diagnosis not present

## 2022-06-20 DIAGNOSIS — H5000 Unspecified esotropia: Secondary | ICD-10-CM | POA: Diagnosis not present

## 2022-07-31 ENCOUNTER — Ambulatory Visit (INDEPENDENT_AMBULATORY_CARE_PROVIDER_SITE_OTHER): Payer: Medicare Other | Admitting: Physician Assistant

## 2022-07-31 ENCOUNTER — Encounter: Payer: Self-pay | Admitting: Physician Assistant

## 2022-07-31 VITALS — BP 130/80 | HR 68 | Temp 97.8°F | Ht 62.0 in | Wt 113.5 lb

## 2022-07-31 DIAGNOSIS — G8929 Other chronic pain: Secondary | ICD-10-CM

## 2022-07-31 DIAGNOSIS — M545 Low back pain, unspecified: Secondary | ICD-10-CM | POA: Diagnosis not present

## 2022-07-31 DIAGNOSIS — I10 Essential (primary) hypertension: Secondary | ICD-10-CM

## 2022-07-31 DIAGNOSIS — R7309 Other abnormal glucose: Secondary | ICD-10-CM

## 2022-07-31 DIAGNOSIS — E782 Mixed hyperlipidemia: Secondary | ICD-10-CM | POA: Diagnosis not present

## 2022-07-31 LAB — LIPID PANEL
Cholesterol: 218 mg/dL — ABNORMAL HIGH (ref 0–200)
HDL: 58.1 mg/dL (ref >39.00)
NonHDL: 159.84
Total CHOL/HDL Ratio: 4
Triglycerides: 261 mg/dL — ABNORMAL HIGH (ref 0.0–149.0)
VLDL: 52.2 mg/dL — ABNORMAL HIGH (ref 0.0–40.0)

## 2022-07-31 LAB — CBC WITH DIFFERENTIAL/PLATELET
Basophils Absolute: 0 10*3/uL (ref 0.0–0.1)
Basophils Relative: 0.6 % (ref 0.0–3.0)
Eosinophils Absolute: 0.1 10*3/uL (ref 0.0–0.7)
Eosinophils Relative: 1.6 % (ref 0.0–5.0)
HCT: 38.7 % (ref 36.0–46.0)
Hemoglobin: 12.9 g/dL (ref 12.0–15.0)
Lymphocytes Relative: 36 % (ref 12.0–46.0)
Lymphs Abs: 2.1 10*3/uL (ref 0.7–4.0)
MCHC: 33.4 g/dL (ref 30.0–36.0)
MCV: 79.5 fl (ref 78.0–100.0)
Monocytes Absolute: 0.3 10*3/uL (ref 0.1–1.0)
Monocytes Relative: 5.6 % (ref 3.0–12.0)
Neutro Abs: 3.2 10*3/uL (ref 1.4–7.7)
Neutrophils Relative %: 56.2 % (ref 43.0–77.0)
Platelets: 190 10*3/uL (ref 150.0–400.0)
RBC: 4.86 Mil/uL (ref 3.87–5.11)
RDW: 15 % (ref 11.5–15.5)
WBC: 5.7 10*3/uL (ref 4.0–10.5)

## 2022-07-31 LAB — COMPREHENSIVE METABOLIC PANEL
ALT: 18 U/L (ref 0–35)
AST: 22 U/L (ref 0–37)
Albumin: 4.7 g/dL (ref 3.5–5.2)
Alkaline Phosphatase: 87 U/L (ref 39–117)
BUN: 14 mg/dL (ref 6–23)
CO2: 26 mEq/L (ref 19–32)
Calcium: 9.5 mg/dL (ref 8.4–10.5)
Chloride: 103 mEq/L (ref 96–112)
Creatinine, Ser: 0.57 mg/dL (ref 0.40–1.20)
GFR: 84.55 mL/min (ref >60.00)
Glucose, Bld: 94 mg/dL (ref 70–99)
Potassium: 3.8 mEq/L (ref 3.5–5.1)
Sodium: 140 mEq/L (ref 135–145)
Total Bilirubin: 0.7 mg/dL (ref 0.2–1.2)
Total Protein: 7.9 g/dL (ref 6.0–8.3)

## 2022-07-31 LAB — HEMOGLOBIN A1C: Hgb A1c MFr Bld: 6 % (ref 4.6–6.5)

## 2022-07-31 LAB — LDL CHOLESTEROL, DIRECT: Direct LDL: 116 mg/dL

## 2022-07-31 MED ORDER — AMLODIPINE BESYLATE 10 MG PO TABS: 10.0000 mg | ORAL_TABLET | Freq: Every day | ORAL | 1 refills | Status: DC

## 2022-07-31 MED ORDER — GABAPENTIN 100 MG PO CAPS: ORAL_CAPSULE | ORAL | 3 refills | Status: DC

## 2022-07-31 MED ORDER — MELOXICAM 7.5 MG PO TABS: 7.5000 mg | ORAL_TABLET | Freq: Every day | ORAL | 0 refills | Status: DC

## 2022-07-31 NOTE — Patient Instructions (Signed)
It was great to see you!  Continue amlodopine 10 mg daily Check BP about once a month, if it starts to increase, check more frequently and let me know  I have refilled gabapentin and meloxicam  Let's follow-up in 6 months, sooner if you have concerns.  Take care,  Jarold Motto PA-C

## 2022-07-31 NOTE — Progress Notes (Addendum)
Cassidy Bennett is a 82 y.o. female here for a follow up of a pre-existing problem.  History of Present Illness:   Chief Complaint  Patient presents with   Hypertension    HPI  Hypertension: She has been compliant with 10 mg Amlodipine daily.  She monitors her blood pressure and keeps a daily log.  She reports a reading of 135/80 yesterday.  Denies any peripheral edema. BP Readings from Last 3 Encounters:  07/31/22 130/80  03/14/22 (!) 145/75  02/08/22 (!) 160/82   Chronic low back pain She endorses tingling in her left toes, only when lying down. This is normal for her.  She is also taking 100 mg Gabapentin twice a day, which helps.  She would also like refill of her meloxicam 7.5 mg daily - uses rarely  Elevated glucose 6 month follow-up. Current medications: none. Blood sugars at home are: not checked. Denies: hypoglycemic or hyperglycemic episodes or symptoms.   Lab Results  Component Value Date   HGBA1C 6.1 01/16/2022     Past Medical History:  Diagnosis Date   Allergy    Complication of anesthesia    History of TB (tuberculosis)    treated 45 plus years ago last cxr 03-15-18  CXR every 5 years   Hypercholesteremia    denies at preop   Hypertension    Osteoarthritis    Osteoporosis    PONV (postoperative nausea and vomiting)    Post-menopausal    Tuberculosis      Social History   Tobacco Use   Smoking status: Never   Smokeless tobacco: Never  Vaping Use   Vaping Use: Never used  Substance Use Topics   Alcohol use: Not Currently    Alcohol/week: 5.0 standard drinks of alcohol    Types: 5 Glasses of wine per week   Drug use: No    Past Surgical History:  Procedure Laterality Date   CATARACT EXTRACTION  2018   right eye   COLONOSCOPY  2015   DILATION AND CURETTAGE OF UTERUS  2000   KNEE ARTHROSCOPY  2014   left   LUMBAR DISC SURGERY     SHOULDER SURGERY     right   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 , Disp: , Rfl:    b complex vitamins tablet, Take 1 tablet by mouth daily. Every other day, Disp: , Rfl:    Calcium Carb-Cholecalciferol (CALCIUM 600 + D PO), Take 1 tablet by mouth daily., Disp: , Rfl:    magnesium 30 MG tablet, Take 30 mg by mouth 3 (three) times a week., Disp: , Rfl:    polyethylene glycol (MIRALAX / GLYCOLAX) packet, Take 17 g by mouth daily as needed for moderate constipation. , Disp: , Rfl:    Vitamin A 2400 MCG (8000 UT) TABS, Take  8,000 Units by mouth daily. , Disp: , Rfl:    vitamin B-12 (CYANOCOBALAMIN) 1000 MCG tablet, Take 1,000 mcg by mouth daily., Disp: , Rfl:    vitamin E 400 UNIT capsule, Take 400 Units by mouth daily., Disp: , Rfl:    Review of Systems:   Review of Systems  Neurological:  Positive for tingling (left foot - toes).    Vitals:   Vitals:   07/31/22 1306  BP: 130/80  Pulse: 68  Temp: 97.8 F (36.6 C)  TempSrc: Temporal  SpO2: 99%  Weight: 113 lb 8 oz (51.5 kg)  Height: 5\' 2"  (1.575 m)     Body mass index is 20.76 kg/m.  Physical Exam:   Physical Exam Vitals and nursing note reviewed.  Constitutional:      General: She is not in acute distress.    Appearance: She is well-developed. She is not ill-appearing or toxic-appearing.  Cardiovascular:     Rate and Rhythm: Normal rate and regular rhythm.     Pulses: Normal pulses.     Heart sounds: Normal heart sounds, S1 normal and S2 normal.  Pulmonary:      Effort: Pulmonary effort is normal.     Breath sounds: Normal breath sounds.  Skin:    General: Skin is warm and dry.  Neurological:     Mental Status: She is alert.     GCS: GCS eye subscore is 4. GCS verbal subscore is 5. GCS motor subscore is 6.  Psychiatric:        Speech: Speech normal.        Behavior: Behavior normal. Behavior is cooperative.     Assessment and Plan:   Essential hypertension Normotensive Continue amlodipine 10 mg daily Follow-up in 6 month(s) sooner if concerns  Chronic bilateral low back pain without sciatica Well controlled overall Continue gabapentin 100 mg -200 mg nightly Continue mobic 7.5 mg daily prn Follow-up in 6 months, sooner if concerns  Mixed hyperlipidemia Update lipid panel and advise accordingly  Elevated glucose Update glucose and advise accordingly   I,Rachel Rivera,acting as a scribe for [employer], PA.,have documented all relevant documentation on the behalf of Jarold Motto, PA,as directed by  Jarold Motto, PA while in the presence of Jarold Motto, [place].  I, Jarold Motto, [place], have reviewed all documentation for this visit. The documentation on 07/31/22 for the exam, diagnosis, procedures, and orders are all accurate and complete.   Jarold Motto, PA-C

## 2022-08-28 ENCOUNTER — Other Ambulatory Visit: Payer: Medicare Other

## 2022-09-25 ENCOUNTER — Ambulatory Visit (INDEPENDENT_AMBULATORY_CARE_PROVIDER_SITE_OTHER): Payer: Medicare Other | Admitting: Physician Assistant

## 2022-09-25 ENCOUNTER — Encounter: Payer: Self-pay | Admitting: Physician Assistant

## 2022-09-25 ENCOUNTER — Ambulatory Visit (INDEPENDENT_AMBULATORY_CARE_PROVIDER_SITE_OTHER)
Admission: RE | Admit: 2022-09-25 | Discharge: 2022-09-25 | Disposition: A | Discharge status: Home / Self Care | Payer: Medicare Other | Source: Ambulatory Visit | Attending: Physician Assistant | Admitting: Physician Assistant

## 2022-09-25 VITALS — BP 140/76 | HR 78 | Temp 97.3°F | Ht 62.0 in | Wt 112.5 lb

## 2022-09-25 DIAGNOSIS — M19041 Primary osteoarthritis, right hand: Secondary | ICD-10-CM | POA: Diagnosis not present

## 2022-09-25 DIAGNOSIS — R0781 Pleurodynia: Secondary | ICD-10-CM

## 2022-09-25 DIAGNOSIS — M79644 Pain in right finger(s): Secondary | ICD-10-CM

## 2022-09-25 DIAGNOSIS — M810 Age-related osteoporosis without current pathological fracture: Secondary | ICD-10-CM | POA: Diagnosis not present

## 2022-09-25 NOTE — Patient Instructions (Addendum)
It was great to see you!  Bone density scan -- I will order this for [place] -- please schedule on your way out  We will update your hand and chest xray today -- please ask for a disc of images when you go so you can share with your specialist To have this done, you can walk in at the [facility] location without a scheduled appointment.  The address is [address] It is across the street from [facility]. xray is located in the basement.   Hours of operation are M-F 8:30am to 5:00pm.  Please note that they are closed for lunch between 12:30 and 1:00pm.     Take care,  Jarold Motto PA-C

## 2022-09-25 NOTE — Progress Notes (Signed)
Cassidy Bennett is a 82 y.o. female here for a new problem.  History of Present Illness:   Chief Complaint  Patient presents with   shoulder blade    Pt c/o pain right should blade area x 1 month.    Thumb pain    Pt c/o right thumb pain x 1 month radiates to wrist.    HPI  Right Shoulder Pain: She complains of right shoulder pain that started about a month ago.  She states that she suddenly woke up with the pain and states her right collar bone was more prominent then.  She states that her right shoulder is lower than her left when looking at herself in the mirror. She is also experiencing accompanying pain in her right rib area.    Thumb Pain:  She reports experiencing pain in her right thumb that started around the same time as her shoulder pain.  She has been taking Advil almost daily at night to relief her symptoms.  She reports being right handed and reports occasionally cooking or doing varies activities in the kitchen. She denies doing any hobbies or regular activity with her hand.  She has been using Voltaren gel twice daily to relief her pain.   She had similar issues in the past with her left thumb and this required an injection -- she has an appointment with this provider next month.   Osteoporosis She is due for DXA She does not want to continue to have this done at [facility]   Past Medical History:  Diagnosis Date   Allergy    Complication of anesthesia    History of TB (tuberculosis)    treated 45 plus years ago last cxr 03-15-18  CXR every 5 years   Hypercholesteremia    denies at preop   Hypertension    Osteoarthritis    Osteoporosis    PONV (postoperative nausea and vomiting)    Post-menopausal    Tuberculosis      Social History   Tobacco Use   Smoking status: Never   Smokeless tobacco: Never  Vaping Use   Vaping status: Never Used  Substance Use Topics   Alcohol use: Not Currently    Alcohol/week: 5.0 standard drinks of alcohol     Types: 5 Glasses of wine per week   Drug use: No    Past Surgical History:  Procedure Laterality Date   CATARACT EXTRACTION  2018   right eye   COLONOSCOPY  2015   DILATION AND CURETTAGE OF UTERUS  2000   KNEE ARTHROSCOPY  2014   left   LUMBAR DISC SURGERY     SHOULDER SURGERY     right   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   Procedure: TOTAL KNEE ARTHROPLASTY;  Surgeon: Ollen Gross, MD;  Location: [facility];  Service: Orthopedics;  Laterality: Left;    Family History  Problem Relation Age of Onset   Other Mother        malaria   Other Father    Cancer Other        stomach   Breast cancer Neg Hx     Allergies  Allergen Reactions   Metoprolol Nausea And Vomiting   Morphine And Codeine Nausea And Vomiting   Other Nausea And Vomiting    Current Medications:   Current Outpatient Medications:    amLODipine (NORVASC) 10 MG tablet, Take 1 tablet (10 mg total) by mouth daily., Disp: 90 tablet, Rfl: 1   Ascorbic Acid (VITAMIN  C PO), Take 1,000 mg by mouth daily. , Disp: , Rfl:    b complex vitamins tablet, Take 1 tablet by mouth daily. Every other day, Disp: , Rfl:    Calcium Carb-Cholecalciferol (CALCIUM 600 + D PO), Take 1 tablet by mouth daily., Disp: , Rfl:    gabapentin (NEURONTIN) 100 MG capsule, Take 100 mg nightly. After one week, increase to 200 mg nightly., Disp: 90 capsule, Rfl: 3   magnesium 30 MG tablet, Take 30 mg by mouth 3 (three) times a week., Disp: , Rfl:    polyethylene glycol (MIRALAX / GLYCOLAX) packet, Take 17 g by mouth daily as needed for moderate constipation. , Disp: , Rfl:    Vitamin A 2400 MCG (8000 UT) TABS, Take 8,000 Units by mouth daily. , Disp: , Rfl:    vitamin B-12 (CYANOCOBALAMIN) 1000 MCG tablet, Take 1,000 mcg by mouth daily., Disp: , Rfl:    vitamin E 400 UNIT capsule, Take 400 Units by mouth daily., Disp: , Rfl:    Review of Systems:   Review of Systems  Musculoskeletal:  Positive for joint pain (right thumb).       +right  shoulder pain    Vitals:   Vitals:   09/25/22 1125  BP: (!) 140/76  Pulse: 78  Temp: (!) 97.3 F (36.3 C)  TempSrc: Temporal  SpO2: 99%  Weight: 112 lb 8 oz (51 kg)  Height: 5\' 2"  (1.575 m)     Body mass index is 20.58 kg/m.  Physical Exam:   Physical Exam Constitutional:      Appearance: Normal appearance. She is well-developed.  HENT:     Head: Normocephalic and atraumatic.  Eyes:     General: Lids are normal.     Extraocular Movements: Extraocular movements intact.     Conjunctiva/sclera: Conjunctivae normal.  Pulmonary:     Effort: Pulmonary effort is normal.  Musculoskeletal:        General: Normal range of motion.     Cervical back: Normal range of motion and neck supple.     Comments: Right clavicle significantly more prominent than left clavicle; no tenderness to palpation  Point tenderness to palpation at right lateral ribcage  Skin:    General: Skin is warm and dry.  Neurological:     Mental Status: She is alert and oriented to person, place, and time.  Psychiatric:        Attention and Perception: Attention and perception normal.        Mood and Affect: Mood normal.        Behavior: Behavior normal.        Thought Content: Thought content normal.        Judgment: Judgment normal.     Assessment and Plan:   Osteoporosis without current pathological fracture, unspecified osteoporosis type I have placed new orders to have this done with [place] [facility]  Thumb pain, right Will obtain xray per patient request She already has appointment with specialist to address this issue in a few weeks Continue Voltaren gel  Rib pain on right side Unclear etiology Will obtain xray for further evaluation, consider advanced imaging if needed Low threshold to refer to sports medicine or orthopedics for sx   I,Safa M Kadhim,acting as a scribe for [employer], PA.,have documented all relevant documentation on the behalf of Jarold Motto, PA,as directed by   Jarold Motto, PA while in the presence of Jarold Motto, [place].   I, Jarold Motto, [place], have reviewed all  documentation for this visit. The documentation on 09/25/22 for the exam, diagnosis, procedures, and orders are all accurate and complete.   Jarold Motto, PA-C

## 2022-10-02 ENCOUNTER — Ambulatory Visit (INDEPENDENT_AMBULATORY_CARE_PROVIDER_SITE_OTHER)
Admission: RE | Admit: 2022-10-02 | Discharge: 2022-10-02 | Disposition: A | Discharge status: Home / Self Care | Payer: Medicare Other | Source: Ambulatory Visit | Attending: Physician Assistant | Admitting: Physician Assistant

## 2022-10-02 DIAGNOSIS — M810 Age-related osteoporosis without current pathological fracture: Secondary | ICD-10-CM

## 2022-10-09 ENCOUNTER — Telehealth: Payer: Self-pay

## 2022-10-09 ENCOUNTER — Ambulatory Visit (INDEPENDENT_AMBULATORY_CARE_PROVIDER_SITE_OTHER): Payer: Medicare Other | Admitting: Physician Assistant

## 2022-10-09 ENCOUNTER — Other Ambulatory Visit (HOSPITAL_COMMUNITY): Payer: Self-pay

## 2022-10-09 ENCOUNTER — Encounter: Payer: Self-pay | Admitting: Physician Assistant

## 2022-10-09 VITALS — BP 136/60 | HR 79 | Temp 97.7°F | Ht 62.0 in | Wt 113.0 lb

## 2022-10-09 DIAGNOSIS — M810 Age-related osteoporosis without current pathological fracture: Secondary | ICD-10-CM

## 2022-10-09 LAB — VITAMIN D 25 HYDROXY (VIT D DEFICIENCY, FRACTURES): VITD: 37.65 ng/mL (ref 30.00–100.00)

## 2022-10-09 NOTE — Patient Instructions (Signed)
It was great to see you!  We are going to try restart Prolia -- give [place] 1-2 weeks to go through this paperwork  Take care,  Jarold Motto PA-C

## 2022-10-09 NOTE — Telephone Encounter (Signed)
Prolia VOB initiated via [email or web address]  Last Prolia inj: 09/14/20 Next Prolia inj DUE:  now

## 2022-10-09 NOTE — Progress Notes (Signed)
Cassidy Bennett is a 82 y.o. female here for a follow up of a pre-existing problem.  History of Present Illness:   Chief Complaint  Patient presents with   Shoulder Pain    Pt still having some pain in right shoulder.   Results    Pt here to discuss Bone Density results.    HPI  Osteoporosis  She states that she was taking fosamax for about one month. She last took it in 2008 when she was 2. She states that she discontinued the medication as she was experiencing negative side effects.  She has also discontinued Prolia but would like to restart it.  She was tolerating it well but there was a lapse in treatment due to her moving to another primary care office. This was done up until 2023 based on my records. She's taking vitamin D supplements 2000 IUD daily. She also taking calcium supplements daily.   Past Medical History:  Diagnosis Date   Allergy    Complication of anesthesia    History of TB (tuberculosis)    treated 45 plus years ago last cxr 03-15-18  CXR every 5 years   Hypercholesteremia    denies at preop   Hypertension    Osteoarthritis    Osteoporosis    PONV (postoperative nausea and vomiting)    Post-menopausal    Tuberculosis      Social History   Tobacco Use   Smoking status: Never   Smokeless tobacco: Never  Vaping Use   Vaping status: Never Used  Substance Use Topics   Alcohol use: Not Currently    Alcohol/week: 5.0 standard drinks of alcohol    Types: 5 Glasses of wine per week   Drug use: No    Past Surgical History:  Procedure Laterality Date   CATARACT EXTRACTION  2018   right eye   COLONOSCOPY  2015   DILATION AND CURETTAGE OF UTERUS  2000   KNEE ARTHROSCOPY  2014   left   LUMBAR DISC SURGERY     SHOULDER SURGERY     right   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   Procedure: TOTAL KNEE ARTHROPLASTY;  Surgeon: Ollen Gross, MD;  Location: [facility];  Service: Orthopedics;  Laterality: Left;    Family History  Problem Relation  Age of Onset   Other Mother        malaria   Other Father    Cancer Other        stomach   Breast cancer Neg Hx     Allergies  Allergen Reactions   Metoprolol Nausea And Vomiting   Morphine And Codeine Nausea And Vomiting   Other Nausea And Vomiting    Current Medications:   Current Outpatient Medications:    amLODipine (NORVASC) 10 MG tablet, Take 1 tablet (10 mg total) by mouth daily., Disp: 90 tablet, Rfl: 1   Ascorbic Acid (VITAMIN C PO), Take 1,000 mg by mouth daily. , Disp: , Rfl:    b complex vitamins tablet, Take 1 tablet by mouth daily. Every other day, Disp: , Rfl:    Calcium Carb-Cholecalciferol (CALCIUM 600 + D PO), Take 1 tablet by mouth daily., Disp: , Rfl:    gabapentin (NEURONTIN) 100 MG capsule, Take 100 mg nightly. After one week, increase to 200 mg nightly., Disp: 90 capsule, Rfl: 3   magnesium 30 MG tablet, Take 30 mg by mouth 3 (three) times a week., Disp: , Rfl:    polyethylene glycol (MIRALAX / GLYCOLAX) packet,  Take 17 g by mouth daily as needed for moderate constipation. , Disp: , Rfl:    Vitamin A 2400 MCG (8000 UT) TABS, Take 8,000 Units by mouth daily. , Disp: , Rfl:    vitamin B-12 (CYANOCOBALAMIN) 1000 MCG tablet, Take 1,000 mcg by mouth daily., Disp: , Rfl:    vitamin E 400 UNIT capsule, Take 400 Units by mouth daily., Disp: , Rfl:    Review of Systems:   Review of Systems  Musculoskeletal:  Positive for joint pain.    Vitals:   Vitals:   10/09/22 1309  BP: 136/60  Pulse: 79  Temp: 97.7 F (36.5 C)  TempSrc: Temporal  SpO2: 96%  Weight: 113 lb (51.3 kg)  Height: 5\' 2"  (1.575 m)     Body mass index is 20.67 kg/m.  Physical Exam:   Physical Exam Constitutional:      Appearance: Normal appearance. She is well-developed.  HENT:     Head: Normocephalic and atraumatic.  Eyes:     General: Lids are normal.     Extraocular Movements: Extraocular movements intact.     Conjunctiva/sclera: Conjunctivae normal.  Pulmonary:     Effort:  Pulmonary effort is normal.  Musculoskeletal:        General: Normal range of motion.     Cervical back: Normal range of motion and neck supple.  Skin:    General: Skin is warm and dry.  Neurological:     Mental Status: She is alert and oriented to person, place, and time.  Psychiatric:        Attention and Perception: Attention and perception normal.        Mood and Affect: Mood normal.        Behavior: Behavior normal.        Thought Content: Thought content normal.        Judgment: Judgment normal.     Assessment and Plan:   Osteoporosis without current pathological fracture, unspecified osteoporosis type Will update Vitamin D Restart Prolia-- I will send message to the prior authorization team to get this process started Continue calcium, vitamin D and weight bearing activity as tolerated   I,Safa M Kadhim,acting as a scribe for [employer], PA.,have documented all relevant documentation on the behalf of Jarold Motto, PA,as directed by  Jarold Motto, PA while in the presence of Jarold Motto, [place].  I, Jarold Motto, [place], have reviewed all documentation for this visit. The documentation on 10/09/22 for the exam, diagnosis, procedures, and orders are all accurate and complete.   Jarold Motto, PA-C

## 2022-10-13 DIAGNOSIS — M1811 Unilateral primary osteoarthritis of first carpometacarpal joint, right hand: Secondary | ICD-10-CM | POA: Diagnosis not present

## 2022-10-13 DIAGNOSIS — M65311 Trigger thumb, right thumb: Secondary | ICD-10-CM | POA: Diagnosis not present

## 2022-10-16 ENCOUNTER — Other Ambulatory Visit (HOSPITAL_COMMUNITY): Payer: Self-pay

## 2022-10-16 NOTE — Telephone Encounter (Signed)
Noted  

## 2022-10-16 NOTE — Telephone Encounter (Signed)
Pt ready for scheduling for Prolia on or after : 10/16/22  Out-of-pocket cost due at time of visit: $0  Primary: [facility] Prolia co-insurance: 0% Admin fee co-insurance: 0%  Secondary: [facility] Prolia co-insurance: $0 Admin fee co-insurance: $0  Medical Benefit Details: Date Benefits were checked: 10/11/22 Deductible: $240 ($240 met)/ Coinsurance: 100%/ Admin Fee: 100%  Prior Auth: not required PA# Expiration Date:   # of doses approved:  Pharmacy benefit: Copay $not covered If patient wants fill through the pharmacy benefit please send prescription to:  ** , and include estimated need by date in rx notes. Pharmacy will ship medication directly to the office.  Patient not eligible for Prolia Copay Card. Copay Card can make patient's cost as little as $25. Link to apply: [email or web address]://[email or web address]  ** This summary of benefits is an estimation of the patient's out-of-pocket cost. Exact cost may very based on individual plan coverage.

## 2022-10-16 NOTE — Telephone Encounter (Signed)
Please schedule pt for Prolia shot.

## 2022-10-26 ENCOUNTER — Ambulatory Visit (INDEPENDENT_AMBULATORY_CARE_PROVIDER_SITE_OTHER): Payer: Medicare Other | Admitting: *Deleted

## 2022-10-26 DIAGNOSIS — M810 Age-related osteoporosis without current pathological fracture: Secondary | ICD-10-CM

## 2022-10-26 MED ORDER — DENOSUMAB 60 MG/ML ~~LOC~~ SOSY
60.0000 mg | PREFILLED_SYRINGE | Freq: Once | SUBCUTANEOUS | Status: AC
Start: 2022-10-26 — End: 2022-10-26
  Administered 2022-10-26: 60 mg via SUBCUTANEOUS

## 2022-10-26 NOTE — Progress Notes (Signed)
Patient present for Prolia Inj  Given SQ left arm  Patient tolerated well  Documented in [facility]

## 2022-11-04 DIAGNOSIS — Z23 Encounter for immunization: Secondary | ICD-10-CM | POA: Diagnosis not present

## 2023-01-30 ENCOUNTER — Ambulatory Visit: Payer: Medicare Other | Admitting: Physician Assistant

## 2023-01-30 ENCOUNTER — Encounter: Payer: Self-pay | Admitting: Physician Assistant

## 2023-01-30 ENCOUNTER — Ambulatory Visit
Admission: RE | Admit: 2023-01-30 | Discharge: 2023-01-30 | Disposition: A | Discharge status: Home / Self Care | Payer: Medicare Other | Source: Ambulatory Visit | Attending: Physician Assistant

## 2023-01-30 VITALS — BP 120/68 | HR 82 | Temp 97.8°F | Ht 62.0 in | Wt 108.5 lb

## 2023-01-30 DIAGNOSIS — R7309 Other abnormal glucose: Secondary | ICD-10-CM

## 2023-01-30 DIAGNOSIS — M79644 Pain in right finger(s): Secondary | ICD-10-CM

## 2023-01-30 DIAGNOSIS — E782 Mixed hyperlipidemia: Secondary | ICD-10-CM | POA: Diagnosis not present

## 2023-01-30 DIAGNOSIS — M25511 Pain in right shoulder: Secondary | ICD-10-CM | POA: Diagnosis not present

## 2023-01-30 DIAGNOSIS — M19011 Primary osteoarthritis, right shoulder: Secondary | ICD-10-CM | POA: Diagnosis not present

## 2023-01-30 DIAGNOSIS — I10 Essential (primary) hypertension: Secondary | ICD-10-CM

## 2023-01-30 LAB — COMPREHENSIVE METABOLIC PANEL
ALT: 18 U/L (ref 0–35)
AST: 22 U/L (ref 0–37)
Albumin: 4.6 g/dL (ref 3.5–5.2)
Alkaline Phosphatase: 49 U/L (ref 39–117)
BUN: 16 mg/dL (ref 6–23)
CO2: 28 meq/L (ref 19–32)
Calcium: 9.2 mg/dL (ref 8.4–10.5)
Chloride: 104 meq/L (ref 96–112)
Creatinine, Ser: 0.62 mg/dL (ref 0.40–1.20)
GFR: 82.56 mL/min (ref >60.00)
Glucose, Bld: 109 mg/dL — ABNORMAL HIGH (ref 70–99)
Potassium: 4.1 meq/L (ref 3.5–5.1)
Sodium: 138 meq/L (ref 135–145)
Total Bilirubin: 0.8 mg/dL (ref 0.2–1.2)
Total Protein: 8 g/dL (ref 6.0–8.3)

## 2023-01-30 LAB — LIPID PANEL
Cholesterol: 227 mg/dL — ABNORMAL HIGH (ref 0–200)
HDL: 55.1 mg/dL (ref >39.00)
LDL Cholesterol: 141 mg/dL — ABNORMAL HIGH (ref 0–99)
NonHDL: 172.22
Total CHOL/HDL Ratio: 4
Triglycerides: 154 mg/dL — ABNORMAL HIGH (ref 0.0–149.0)
VLDL: 30.8 mg/dL (ref 0.0–40.0)

## 2023-01-30 LAB — HEMOGLOBIN A1C: Hgb A1c MFr Bld: 6.1 % (ref 4.6–6.5)

## 2023-01-30 NOTE — Progress Notes (Signed)
Cassidy Bennett is a 82 y.o. female here for a follow-up of a pre-existing problem. History of Present Illness:   Chief Complaint  Patient presents with   Hyperlipidemia   Hypertension   Thumb pain    Pt c/o pain right thumb and pain is radiating up into arm, she saw a hand specialist and was told next step surgery and she does not want surgery, would like to see someone else.   HPI - here w/ husband  Hypertension: Managed/Compliant with 10 mg Amlodipine daily.  Monitored at home with most recent reading of 120/60.  Denies excessive caffeine intake, stimulant usage, excessive alcohol intake, or increase in salt consumption.  Denies chest pain, shortness of breath, blurred vision, dizziness, unusual headaches, or lower leg swelling.  BP Readings from Last 3 Encounters:  01/30/23 120/68  10/09/22 136/60  09/25/22 (!) 140/76   Elevated Glucose: Recheck A1c today.  Notes that she has had a lot of food over Thanksgiving. Denies hypoglycemic or hyperglycemic episodes or symptoms.   Lab Results  Component Value Date   HGBA1C 6.0 07/31/2022   Trigger Thumb (Right): Complains of pain in her right thumb radiating up into her arm. Has seen Betha Loa with [facility] and was recommended to have surgery to resolve the problem.  Received a steroid shot - did help  Expressed that she'd like to have a different referral -- she is not interested in surgery unless absolutely required  Shoulder Pain (Right): Reports that she has been experiencing right shoulder pain. States that she notices it after laying down for a while and has to sit up to relieve the pain.      Past Medical History:  Diagnosis Date   Allergy    Complication of anesthesia    History of TB (tuberculosis)    treated 45 plus years ago last cxr 03-15-18  CXR every 5 years   Hypercholesteremia    denies at preop   Hypertension    Osteoarthritis    Osteoporosis    PONV (postoperative nausea and vomiting)     Post-menopausal    Tuberculosis     Social History   Tobacco Use   Smoking status: Never   Smokeless tobacco: Never  Vaping Use   Vaping status: Never Used  Substance Use Topics   Alcohol use: Not Currently    Alcohol/week: 5.0 standard drinks of alcohol    Types: 5 Glasses of wine per week   Drug use: No   Past Surgical History:  Procedure Laterality Date   CATARACT EXTRACTION  2018   right eye   COLONOSCOPY  2015   DILATION AND CURETTAGE OF UTERUS  2000   KNEE ARTHROSCOPY  2014   left   LUMBAR DISC SURGERY     SHOULDER SURGERY     right   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   Procedure: TOTAL KNEE ARTHROPLASTY;  Surgeon: Ollen Gross, MD;  Location: [facility];  Service: Orthopedics;  Laterality: Left;   Family History  Problem Relation Age of Onset   Other Mother        malaria   Other Father    Cancer Other        stomach   Breast cancer Neg Hx    Allergies  Allergen Reactions   Metoprolol Nausea And Vomiting   Morphine And Codeine Nausea And Vomiting   Other Nausea And Vomiting   Current Medications:   Current Outpatient Medications:    amLODipine (NORVASC) 10 MG  tablet, Take 1 tablet (10 mg total) by mouth daily., Disp: 90 tablet, Rfl: 1   Ascorbic Acid (VITAMIN C PO), Take 1,000 mg by mouth daily. , Disp: , Rfl:    b complex vitamins tablet, Take 1 tablet by mouth daily. Every other day, Disp: , Rfl:    Calcium Carb-Cholecalciferol (CALCIUM 600 + D PO), Take 1 tablet by mouth daily., Disp: , Rfl:    cholecalciferol (VITAMIN D3) 25 MCG (1000 UNIT) tablet, Take 2,000 Units by mouth daily., Disp: , Rfl:    magnesium 30 MG tablet, Take 30 mg by mouth 3 (three) times a week., Disp: , Rfl:    polyethylene glycol (MIRALAX / GLYCOLAX) packet, Take 17 g by mouth daily as needed for moderate constipation. , Disp: , Rfl:    Vitamin A 2400 MCG (8000 UT) TABS, Take 8,000 Units by mouth daily. , Disp: , Rfl:    vitamin B-12 (CYANOCOBALAMIN) 1000 MCG tablet, Take  1,000 mcg by mouth daily., Disp: , Rfl:    vitamin E 400 UNIT capsule, Take 400 Units by mouth daily., Disp: , Rfl:   Review of Systems:   ROS See pertinent positives and negatives as per the HPI.  Vitals:   Vitals:   01/30/23 1051  BP: 120/68  Pulse: 82  Temp: 97.8 F (36.6 C)  TempSrc: Temporal  SpO2: 98%  Weight: 108 lb 8 oz (49.2 kg)  Height: 5\' 2"  (1.575 m)     Body mass index is 19.84 kg/m.  Physical Exam:   Physical Exam Vitals and nursing note reviewed.  Constitutional:      General: She is not in acute distress.    Appearance: She is well-developed. She is not ill-appearing or toxic-appearing.  Cardiovascular:     Rate and Rhythm: Normal rate and regular rhythm.     Pulses: Normal pulses.     Heart sounds: Normal heart sounds, S1 normal and S2 normal.  Pulmonary:     Effort: Pulmonary effort is normal.     Breath sounds: Normal breath sounds.  Musculoskeletal:     Comments: Tenderness to palpation to medial aspect of right scapula No bony tenderness to scapula, cervical/thoracic spine, anterior shoulder Normal range of motion of right shoulder  Skin:    General: Skin is warm and dry.  Neurological:     Mental Status: She is alert.     GCS: GCS eye subscore is 4. GCS verbal subscore is 5. GCS motor subscore is 6.  Psychiatric:        Speech: Speech normal.        Behavior: Behavior normal. Behavior is cooperative.     Assessment and Plan:   Thumb pain, right Will refer to sports medicine for second opinion Will also refer to occupational therapy with Nate with [facility] Continue splint/supportive care in the meantime  Elevated glucose Update A1c and provide recommendations accordingly  Mixed hyperlipidemia Update lipid panel and provide recommendations  Acute pain of right shoulder Unclear etiology Patient is requesting imaging -- will order xray Will also refer to sports medicine, will likely benefit from physical therapy but will defer this  decision to sports medicine  Essential hypertension Normotensive Continue amlodipine 10 mg daily Follow-up in 6 months, sooner if concerns  I,Emily Lagle,acting as a scribe for [employer], PA.,have documented all relevant documentation on the behalf of Jarold Motto, PA,as directed by  Jarold Motto, PA while in the presence of Jarold Motto, [place].  I, Jarold Motto, [place], have reviewed  all documentation for this visit. The documentation on 01/30/23 for the exam, diagnosis, procedures, and orders are all accurate and complete.  Jarold Motto, PA-C

## 2023-01-30 NOTE — Patient Instructions (Signed)
It was great to see you!  We will update your blood work today.  I will refer you to physical therapy with Nate at [facility] -- they will call you to schedule this with him. He will help you strengthen your hands.  An order for xray To have this done, you can walk in at the [facility] location without a scheduled appointment.  The address is [address] It is across the street from [facility]. [facility] is located in the basement.  Hours of operation are M-F 8:30am to 5:00pm.  Please note that they are closed for lunch between 12:30 and 1:00pm.  A referral has been placed for you to see one of our fantastic providers at [facility]. Someone from their office will be in touch soon regarding scheduling your appointment.  Their location:  [facility] at [facility]  [address] on the 1st floor Phone number [phone] Fax [phone].   This location is across the street from the entrance to [employer] and in the same complex as the [facility]  Take care,  Jarold Motto PA-C

## 2023-01-31 NOTE — Therapy (Signed)
OUTPATIENT OCCUPATIONAL THERAPY ORTHO EVALUATION  Patient Name: Cassidy Bennett MRN: [id number] DOB:[birth year], 82 y.o., female Today's Date: 02/01/2023  PCP: Jarold Motto, PA  REFERRING PROVIDER: Jarold Motto, PA   END OF SESSION:  OT End of Session - 02/01/23 1104     Visit Number 1    Number of Visits 5    Date for OT Re-Evaluation 03/16/23    Authorization Type Medicare    OT Start Time 1104    OT Stop Time 1133    OT Time Calculation (min) 29 min    Equipment Utilized During Treatment Band-Aids    Activity Tolerance Patient tolerated treatment well;No increased pain;Patient limited by fatigue;Patient limited by pain    Behavior During Therapy [facility] for tasks assessed/performed             Past Medical History:  Diagnosis Date   Allergy    Complication of anesthesia    History of TB (tuberculosis)    treated 45 plus years ago last cxr 03-15-18  CXR every 5 years   Hypercholesteremia    denies at preop   Hypertension    Osteoarthritis    Osteoporosis    PONV (postoperative nausea and vomiting)    Post-menopausal    Tuberculosis    Past Surgical History:  Procedure Laterality Date   CATARACT EXTRACTION  2018   right eye   COLONOSCOPY  2015   DILATION AND CURETTAGE OF UTERUS  2000   KNEE ARTHROSCOPY  2014   left   LUMBAR DISC SURGERY     SHOULDER SURGERY     right   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   Procedure: TOTAL KNEE ARTHROPLASTY;  Surgeon: Ollen Gross, MD;  Location: [facility];  Service: Orthopedics;  Laterality: Left;   Patient Active Problem List   Diagnosis Date Noted   Osteoarthritis of left knee 08/12/2018   Closed displaced fracture of proximal phalanx of right great toe 01/03/2017   Rotator cuff syndrome of right shoulder 01/03/2017   Hyperlipidemia 09/05/2016   Essential hypertension 08/27/2015   Urine abnormality 03/29/2015   Routine general medical examination at a health care facility 06/26/2013   History of TB  (tuberculosis) 06/17/2012   Seasonal allergies 01/22/2008   Osteoporosis 12/13/2006   OA (osteoarthritis) of knee 10/11/2006    ONSET DATE: Pain off-and-on for years (acute on chronic)  REFERRING DIAG: M79.644 (ICD-10-CM) - Thumb pain, right   THERAPY DIAG:  Thumb pain, right  Localized edema  Muscle weakness (generalized)  Other lack of coordination  Pain in joint of right hand  Stiffness of right shoulder, not elsewhere classified  Rationale for Evaluation and Treatment: Rehabilitation  SUBJECTIVE:   SUBJECTIVE STATEMENT: She states having past injections in the thumb for this issue, but now she is swollen at the volar MCP joint, tender to touch, decreased ability to flex her thumb with a catching sensation that pops and locks.    Pt accompanied by:  Husband  PERTINENT HISTORY: Per provider notes: "Pt c/o pain right thumb and pain is radiating up into arm, she saw a hand specialist and was told next step surgery and she does not want surgery, would like to see someone else."   PRECAUTIONS: None  RED FLAGS: None   WEIGHT BEARING RESTRICTIONS: No  PAIN:  Are you having pain? Yes: NPRS scale: 0/10 at rest but holding an object pain goes up to 9/10 Pain location: Right thumb volar MCP Pain description: Catching and sharp Aggravating factors: Gripping and  squeezing objects Relieving factors: Rest and immobilization  FALLS: Has patient fallen in last 6 months? No  LIVING ENVIRONMENT: Lives with: lives with their spouse Lives in: House/apartment Has following equipment at home: None  PLOF: Independent  PATIENT GOALS: Decrease pain and problems with the right dominant hand  NEXT MD VISIT: As needed    OBJECTIVE: (All objective assessments below are from initial evaluation on: 02/01/23 unless otherwise specified.)   HAND DOMINANCE: Right   ADLs: Overall ADLs: States decreased ability to grab, hold household objects, pain and difficulty to open containers,  perform FMS tasks (manipulate fasteners on clothing)   FUNCTIONAL OUTCOME MEASURES: Eval: Quick DASH 36% impairment today  (Higher % Score  =  More Impairment)    UPPER EXTREMITY ROM     Shoulder to Wrist AROM Right eval Left eval  Wrist flexion 50 60  Wrist extension 62 70  (Blank rows = not tested)   Hand AROM Right eval Left eval  Full Fist Ability (or Gap to Distal Palmar Crease) full   Thumb MCP (0-60) 0- 38 0- 52  Thumb IP (0-80) 0- 26 0- 54  (Blank rows = not tested)   HAND FUNCTION: Eval: Observed weakness in affected Rt hand.  Not tested due to pain and locking today will be tested as needed   COORDINATION: Eval: Observed coordination impairments with affected right hand, as seen by difficulty opposing and moving the thumb.  Details will be tested as needed  SENSATION: Eval:  Light touch intact today  COGNITION: Eval: Overall cognitive status: WFL for evaluation today   OBSERVATIONS:   Eval: Mildly swollen in right thumb volar MCP joint and tender to touch, OT can palpate triggering and locking sensations as she moves the IP joint.   TODAY'S TREATMENT:  Post-evaluation treatment:   OT attempts to use the Band-Aid trick to limit IP flexion and thumb flexor tendon excursion.  This works very well, and she states no pain at the sore volar MCP joint when wearing a Band-Aid and moving her thumb.    She was given safety/self-care information on prevention of stenosing tenosynovitis to the thumb including the following last:     "This is called "Trigger Thumb"   or stenosing tenosynovitis of the thumb.    To manage it: Avoid repetition Avoid pressure to sore area (wear glove to cushion) Avoid bending the tip of your thumb (wear 1 or 2 band aides at all times for 4-6 weeks) Gently massage around the thumb muscles Do gentle stretch pulling backward on thumb, lightly, for 15seconds, 3x, 3 x day Do gentle stretches on your wrist (Look at pictures)  Use ice or  cold pack to decrease swelling and inflammation in the sore spot"      She was also given a home exercise program as listed below (including scapular retraction due to complaints of upper trap and neck pinching and tightness at times).  Each of these were explained there, demonstrated to her and she performed back without any significant pain or problems.  Exercises - Seated Thumb MP Extension PROM  - 3 x daily - 3 reps - 15 sec hold - Wrist Prayer Stretch  - 3 x daily - 3 reps - 15 sec hold - Seated Scapular Retraction with External Rotation  - 4-6 x daily - 5-10 reps - 5 seconds hold  She states feeling better at the end of the session already.   PATIENT EDUCATION: Education details: See tx section above for details  Person educated: Patient Education method: Engineer, structural, Teach back, Handouts  Education comprehension: States and demonstrates understanding, Additional Education required    HOME EXERCISE PROGRAM: Access Code: [email or web address] URL: [email or web address] Date: 02/01/2023 Prepared by: Fannie Knee   GOALS: Goals reviewed with patient? Yes   SHORT TERM GOALS: (STG required if POC>30 days) Target Date: 02/01/2023  Pt will obtain protective, custom orthotic. Goal status: TBD/PRN  2.  Pt will demo/state understanding of initial HEP to improve pain levels and prerequisite motion. Goal status: INITIAL   LONG TERM GOALS: Target Date: 03/16/2023  Pt will improve A/ROM in right thumb flexion at the MCP and IP joint from 64* total to at least 80*, to have functional motion for tasks like reach and grasp.  Goal status: INITIAL  2.  Pt will decrease pain at worst from 9/10 to 3/10 or better to have better sleep and occupational participation in daily roles. Goal status: INITIAL  ASSESSMENT:  CLINICAL IMPRESSION: Patient is a 82y.o. 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  She will benefit from outpatient occupational therapy to decrease symptoms and increase quality of life.  If she can manage this conservatively, there will be no need for surgical consult, but this can be a difficult problem to eat and less you can change some habits and routines.   PERFORMANCE DEFICITS: in functional skills including ADLs, IADLs, coordination, dexterity, edema, ROM, strength, pain, fascial restrictions, flexibility, Fine motor control, body mechanics, endurance, and UE functional use, cognitive skills including problem solving, and psychosocial skills including coping strategies, environmental adaptation, and habits.   IMPAIRMENTS: are limiting patient from ADLs, IADLs, leisure, and social participation.   COMORBIDITIES: may have co-morbidities  that affects occupational performance. Patient will benefit from skilled OT to address above impairments and improve overall function.  MODIFICATION OR ASSISTANCE TO COMPLETE EVALUATION: No modification of tasks or assist necessary to complete an evaluation.  OT OCCUPATIONAL PROFILE AND HISTORY: Problem focused assessment: Including review of records relating to presenting problem.  CLINICAL DECISION MAKING: LOW - limited treatment options, no task modification necessary  REHAB POTENTIAL: Excellent  EVALUATION COMPLEXITY: Low      PLAN:  OT FREQUENCY: 1-2x/week  OT DURATION: 6 weeks through 03/16/2023 and up to 5 total visits, but she may only need 1-2 more visits in total if she can manage this program well  PLANNED INTERVENTIONS: 97168 OT Re-evaluation, 97535 self care/ADL training, 47829 therapeutic exercise, 97530 therapeutic activity, 97140 manual therapy, 97010 moist heat, 97010 cryotherapy, 97034 contrast bath, 97760 Orthotics management and training, 56213 Splinting (initial encounter), [id number] Subsequent splinting/medication, Dry needling, coping  strategies training, patient/family education, and DME and/or AE instructions  RECOMMENDED OTHER SERVICES: None now  CONSULTED AND AGREED WITH PLAN OF CARE: Patient and family member/caregiver  PLAN FOR NEXT SESSION:   Review home exercise program and initial recommendations for management of trigger thumb   Fannie Knee, OTR/L, CHT 02/01/2023, 11:57 AM

## 2023-02-01 ENCOUNTER — Encounter: Payer: Self-pay | Admitting: Rehabilitative and Restorative Service Providers"

## 2023-02-01 ENCOUNTER — Other Ambulatory Visit: Payer: Self-pay

## 2023-02-01 ENCOUNTER — Ambulatory Visit (INDEPENDENT_AMBULATORY_CARE_PROVIDER_SITE_OTHER): Payer: Medicare Other | Admitting: Rehabilitative and Restorative Service Providers"

## 2023-02-01 DIAGNOSIS — M25611 Stiffness of right shoulder, not elsewhere classified: Secondary | ICD-10-CM

## 2023-02-01 DIAGNOSIS — M79644 Pain in right finger(s): Secondary | ICD-10-CM

## 2023-02-01 DIAGNOSIS — R600 Localized edema: Secondary | ICD-10-CM

## 2023-02-01 DIAGNOSIS — M25541 Pain in joints of right hand: Secondary | ICD-10-CM

## 2023-02-01 DIAGNOSIS — R278 Other lack of coordination: Secondary | ICD-10-CM

## 2023-02-01 DIAGNOSIS — M6281 Muscle weakness (generalized): Secondary | ICD-10-CM

## 2023-02-07 NOTE — Therapy (Incomplete)
OUTPATIENT OCCUPATIONAL THERAPY TREATMENT NOTE  Patient Name: Cassidy Bennett MRN: [id number] DOB:10/17/1940, 82 y.o., female Today's Date: 02/07/2023  PCP: Jarold Motto, PA  REFERRING PROVIDER: Jarold Motto, PA   END OF SESSION:    Past Medical History:  Diagnosis Date   Allergy    Complication of anesthesia    History of TB (tuberculosis)    treated 45 plus years ago last cxr 03-15-18  CXR every 5 years   Hypercholesteremia    denies at preop   Hypertension    Osteoarthritis    Osteoporosis    PONV (postoperative nausea and vomiting)    Post-menopausal    Tuberculosis    Past Surgical History:  Procedure Laterality Date   CATARACT EXTRACTION  2018   right eye   COLONOSCOPY  2015   DILATION AND CURETTAGE OF UTERUS  2000   KNEE ARTHROSCOPY  2014   left   LUMBAR DISC SURGERY     SHOULDER SURGERY     right   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   Procedure: TOTAL KNEE ARTHROPLASTY;  Surgeon: Ollen Gross, MD;  Location: [facility];  Service: Orthopedics;  Laterality: Left;   Patient Active Problem List   Diagnosis Date Noted   Osteoarthritis of left knee 08/12/2018   Closed displaced fracture of proximal phalanx of right great toe 01/03/2017   Rotator cuff syndrome of right shoulder 01/03/2017   Hyperlipidemia 09/05/2016   Essential hypertension 08/27/2015   Urine abnormality 03/29/2015   Routine general medical examination at a health care facility 06/26/2013   History of TB (tuberculosis) 06/17/2012   Seasonal allergies 01/22/2008   Osteoporosis 12/13/2006   OA (osteoarthritis) of knee 10/11/2006    ONSET DATE: Pain off-and-on for years (acute on chronic)  REFERRING DIAG: M79.644 (ICD-10-CM) - Thumb pain, right   THERAPY DIAG:  No diagnosis found.  Rationale for Evaluation and Treatment: Rehabilitation  PERTINENT HISTORY: Per provider notes: "Pt c/o pain right thumb and pain is radiating up into arm, she saw a hand specialist and was  told next step surgery and she does not want surgery, would like to see someone else."  She states having past injections in the thumb for this issue, but now she is swollen at the volar MCP joint, tender to touch, decreased ability to flex her thumb with a catching sensation that pops and locks.    PRECAUTIONS: None  RED FLAGS: None    SUBJECTIVE:   SUBJECTIVE STATEMENT: She states ***.    Pt accompanied by:  Husband   WEIGHT BEARING RESTRICTIONS: No  PAIN:  Are you having pain? Yes: NPRS scale: 0/10 at rest but holding an object pain goes up to 9/10 Pain location: Right thumb volar MCP Pain description: Catching and sharp Aggravating factors: Gripping and squeezing objects Relieving factors: Rest and immobilization   PATIENT GOALS: Decrease pain and problems with the right dominant hand  NEXT MD VISIT: As needed    OBJECTIVE: (All objective assessments below are from initial evaluation on: 02/01/23 unless otherwise specified.)   HAND DOMINANCE: Right   ADLs: Overall ADLs: States decreased ability to grab, hold household objects, pain and difficulty to open containers, perform FMS tasks (manipulate fasteners on clothing)   FUNCTIONAL OUTCOME MEASURES: Eval: Quick DASH 36% impairment today  (Higher % Score  =  More Impairment)    UPPER EXTREMITY ROM     Shoulder to Wrist AROM Right eval Left eval  Wrist flexion 50 60  Wrist extension 62 70  (  Blank rows = not tested)   Hand AROM Right eval Left eval  Full Fist Ability (or Gap to Distal Palmar Crease) full   Thumb MCP (0-60) 0- 38 0- 52  Thumb IP (0-80) 0- 26 0- 54  (Blank rows = not tested)   HAND FUNCTION: Eval: Observed weakness in affected Rt hand.  Not tested due to pain and locking today will be tested as needed   COORDINATION: Eval: Observed coordination impairments with affected right hand, as seen by difficulty opposing and moving the thumb.  Details will be tested as needed  OBSERVATIONS:    Eval: Mildly swollen in right thumb volar MCP joint and tender to touch, OT can palpate triggering and locking sensations as she moves the IP joint.   TODAY'S TREATMENT:  02/09/23: ***  Review home exercise program and initial recommendations for management of trigger thumb    Post-evaluation treatment:   OT attempts to use the Band-Aid trick to limit IP flexion and thumb flexor tendon excursion.  This works very well, and she states no pain at the sore volar MCP joint when wearing a Band-Aid and moving her thumb.    She was given safety/self-care information on prevention of stenosing tenosynovitis to the thumb including the following last:     "This is called "Trigger Thumb"   or stenosing tenosynovitis of the thumb.    To manage it: Avoid repetition Avoid pressure to sore area (wear glove to cushion) Avoid bending the tip of your thumb (wear 1 or 2 band aides at all times for 4-6 weeks) Gently massage around the thumb muscles Do gentle stretch pulling backward on thumb, lightly, for 15seconds, 3x, 3 x day Do gentle stretches on your wrist (Look at pictures)  Use ice or cold pack to decrease swelling and inflammation in the sore spot"      She was also given a home exercise program as listed below (including scapular retraction due to complaints of upper trap and neck pinching and tightness at times).  Each of these were explained there, demonstrated to her and she performed back without any significant pain or problems.  Exercises - Seated Thumb MP Extension PROM  - 3 x daily - 3 reps - 15 sec hold - Wrist Prayer Stretch  - 3 x daily - 3 reps - 15 sec hold - Seated Scapular Retraction with External Rotation  - 4-6 x daily - 5-10 reps - 5 seconds hold  She states feeling better at the end of the session already.   PATIENT EDUCATION: Education details: See tx section above for details  Person educated: Patient Education method: Verbal Instruction, Teach back, Handouts   Education comprehension: States and demonstrates understanding, Additional Education required    HOME EXERCISE PROGRAM: Access Code: [email or web address] URL: [email or web address] Date: 02/01/2023 Prepared by: Fannie Knee   GOALS: Goals reviewed with patient? Yes   SHORT TERM GOALS: (STG required if POC>30 days) Target Date: 02/01/2023  Pt will obtain protective, custom orthotic. Goal status: TBD/PRN  2.  Pt will demo/state understanding of initial HEP to improve pain levels and prerequisite motion. Goal status: INITIAL   LONG TERM GOALS: Target Date: 03/16/2023  Pt will improve A/ROM in right thumb flexion at the MCP and IP joint from 64* total to at least 80*, to have functional motion for tasks like reach and grasp.  Goal status: INITIAL  2.  Pt will decrease pain at worst from 9/10 to 3/10 or better to have better sleep and occupational  participation in daily roles. Goal status: INITIAL  ASSESSMENT:  CLINICAL IMPRESSION: 02/09/23: ***  Eval: Patient is a 82y.o. 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  She will benefit from outpatient occupational therapy to decrease symptoms and increase quality of life.  If she can manage this conservatively, there will be no need for surgical consult, but this can be a difficult problem to eat and less you can change some habits and routines.      PLAN:  OT FREQUENCY: 1-2x/week  OT DURATION: 6 weeks through 03/16/2023 and up to 5 total visits, but she may only need 1-2 more visits in total if she can manage this program well  PLANNED INTERVENTIONS: 97168 OT Re-evaluation, 97535 self care/ADL training, 16109 therapeutic exercise, 97530 therapeutic activity, 97140 manual therapy, 97010 moist heat, 97010 cryotherapy, 97034 contrast bath, 97760 Orthotics management and training,  97760 Splinting (initial encounter), [id number] Subsequent splinting/medication, Dry needling, coping strategies training, patient/family education, and DME and/or AE instructions  CONSULTED AND AGREED WITH PLAN OF CARE: Patient and family member/caregiver  PLAN FOR NEXT SESSION:   ***   Fannie Knee, OTR/L, CHT 02/07/2023, 2:45 PM

## 2023-02-08 ENCOUNTER — Ambulatory Visit: Payer: Medicare Other | Admitting: Family Medicine

## 2023-02-08 NOTE — Therapy (Signed)
OUTPATIENT OCCUPATIONAL THERAPY TREATMENT NOTE  Patient Name: Cassidy Bennett MRN: [id number] DOB:11/06/1940, 82 y.o., female Today's Date: 02/12/2023  PCP: Jarold Motto, PA  REFERRING PROVIDER: Jarold Motto, PA   END OF SESSION:  OT End of Session - 02/12/23 1621     Visit Number 2    Number of Visits 5    Date for OT Re-Evaluation 03/16/23    Authorization Type Medicare    OT Start Time 1618    OT Stop Time 1648    OT Time Calculation (min) 30 min    Equipment Utilized During Treatment orthotic materials    Activity Tolerance Patient tolerated treatment well;No increased pain;Patient limited by fatigue;Patient limited by pain    Behavior During Therapy [facility] for tasks assessed/performed              Past Medical History:  Diagnosis Date   Allergy    Complication of anesthesia    History of TB (tuberculosis)    treated 45 plus years ago last cxr 03-15-18  CXR every 5 years   Hypercholesteremia    denies at preop   Hypertension    Osteoarthritis    Osteoporosis    PONV (postoperative nausea and vomiting)    Post-menopausal    Tuberculosis    Past Surgical History:  Procedure Laterality Date   CATARACT EXTRACTION  2018   right eye   COLONOSCOPY  2015   DILATION AND CURETTAGE OF UTERUS  2000   KNEE ARTHROSCOPY  2014   left   LUMBAR DISC SURGERY     SHOULDER SURGERY     right   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   Procedure: TOTAL KNEE ARTHROPLASTY;  Surgeon: Ollen Gross, MD;  Location: [facility];  Service: Orthopedics;  Laterality: Left;   Patient Active Problem List   Diagnosis Date Noted   Osteoarthritis of left knee 08/12/2018   Closed displaced fracture of proximal phalanx of right great toe 01/03/2017   Rotator cuff syndrome of right shoulder 01/03/2017   Hyperlipidemia 09/05/2016   Essential hypertension 08/27/2015   Urine abnormality 03/29/2015   Routine general medical examination at a health care facility 06/26/2013   History  of TB (tuberculosis) 06/17/2012   Seasonal allergies 01/22/2008   Osteoporosis 12/13/2006   OA (osteoarthritis) of knee 10/11/2006    ONSET DATE: Pain off-and-on for years (acute on chronic)  REFERRING DIAG: M79.644 (ICD-10-CM) - Thumb pain, right   THERAPY DIAG:  Thumb pain, right  Localized edema  Other lack of coordination  Pain in joint of right hand  Muscle weakness (generalized)  Rationale for Evaluation and Treatment: Rehabilitation  PERTINENT HISTORY: Per provider notes: "Pt c/o pain right thumb and pain is radiating up into arm, she saw a hand specialist and was told next step surgery and she does not want surgery, would like to see someone else."  She states having past injections in the thumb for this issue, but now she is swollen at the volar MCP joint, tender to touch, decreased ability to flex her thumb with a catching sensation that pops and locks.    PRECAUTIONS: None;  RED FLAGS: None    SUBJECTIVE:   SUBJECTIVE STATEMENT: She states still having some soreness and tenderness to the volar base of the thumb of the right hand.  She states wearing Band-Aids most of the time which did help and she states doing exercises but she is still upset that it is painful..    Pt accompanied by:  Husband  WEIGHT BEARING RESTRICTIONS: No  PAIN:  Are you having pain? Yes: NPRS scale: 0/10 at rest but holding an object pain goes up to 7-8/10 Pain location: Right thumb volar MCP Pain description: Catching and sharp Aggravating factors: Gripping and squeezing objects Relieving factors: Rest and immobilization   PATIENT GOALS: Decrease pain and problems with the right dominant hand  NEXT MD VISIT: As needed    OBJECTIVE: (All objective assessments below are from initial evaluation on: 02/01/23 unless otherwise specified.)   HAND DOMINANCE: Right   ADLs: Overall ADLs: States decreased ability to grab, hold household objects, pain and difficulty to open containers,  perform FMS tasks (manipulate fasteners on clothing)   FUNCTIONAL OUTCOME MEASURES: Eval: Quick DASH 36% impairment today  (Higher % Score  =  More Impairment)    UPPER EXTREMITY ROM     Shoulder to Wrist AROM Right eval Left eval  Wrist flexion 50 60  Wrist extension 62 70  (Blank rows = not tested)   Hand AROM Right eval Left eval  Full Fist Ability (or Gap to Distal Palmar Crease) full   Thumb MCP (0-60) 0- 38 0- 52  Thumb IP (0-80) 0- 26 0- 54  (Blank rows = not tested)   HAND FUNCTION: Eval: Observed weakness in affected Rt hand.  Not tested due to pain and locking today will be tested as needed   COORDINATION: Eval: Observed coordination impairments with affected right hand, as seen by difficulty opposing and moving the thumb.  Details will be tested as needed  OBSERVATIONS:   Eval: Mildly swollen in right thumb volar MCP joint and tender to touch, OT can palpate triggering and locking sensations as she moves the IP joint.   TODAY'S TREATMENT:  02/12/23: OT reviews her home exercise program with her including gentle thumb extension stretches, self massages, prayer stretch, and due to new complaint today of some dorsal thumb pain OT educates on a gentle ulnar deviation stretch that stretches out this radial compartment of the wrist.  She states this does not hurt her and that it feels good.  Next, as she continues to be stiff and sore OT feels that she needs increased immobilization, so OT Custom fabricates a hand-based thumb spica orthotic with the IP joint free today and the thumb positioned halfway between radial abduction and palmar abduction.  Despite the IP joint being left free, she cannot move the MP joint and therefore cannot trigger with this on.  She states that it is "cute" and that it fits well and stops her pain.  She was educated that she can wear this most of the time including nighttime, but wear a Band-Aid when not wearing this.  We discuss the management  of trigger finger again including that this is a problem that can easily take 6 to 8 weeks to fully resolve if she is not causing exacerbations of pain to herself.  We will withhold therapy for 2 weeks to allow her to self manage this.  She states understanding       PATIENT EDUCATION: Education details: See tx section above for details  Person educated: Patient Education method: Verbal Instruction, Teach back, Handouts  Education comprehension: States and demonstrates understanding, Additional Education required    HOME EXERCISE PROGRAM: Access Code: [email or web address] URL: [email or web address] Date: 02/01/2023 Prepared by: Fannie Knee   GOALS: Goals reviewed with patient? Yes   SHORT TERM GOALS: (STG required if POC>30 days) Target Date: 02/01/2023  Pt will obtain protective, custom orthotic. Goal  status: 02/12/2023: Met  2.  Pt will demo/state understanding of initial HEP to improve pain levels and prerequisite motion. Goal status: 02/12/2023: Met   LONG TERM GOALS: Target Date: 03/16/2023  Pt will improve A/ROM in right thumb flexion at the MCP and IP joint from 64* total to at least 80*, to have functional motion for tasks like reach and grasp.  Goal status: INITIAL  2.  Pt will decrease pain at worst from 9/10 to 3/10 or better to have better sleep and occupational participation in daily roles. Goal status: INITIAL  ASSESSMENT:  CLINICAL IMPRESSION: 02/12/23: She now has a custom orthosis as the Band-Aid was working, but she needed some more rigid support.  She can switch between the Band-Aid or this orthosis what ever works better for her.  She also knows that this should take 6 to 8 weeks to fully help this problem go away.  We will follow-up in 2 weeks to see how she is doing    PLAN:  OT FREQUENCY: 1-2x/week  OT DURATION: 6 weeks through 03/16/2023 and up to 5 total visits, but she may only need 1-2 more visits in total if she can manage this  program well  PLANNED INTERVENTIONS: 97168 OT Re-evaluation, 97535 self care/ADL training, 29562 therapeutic exercise, 97530 therapeutic activity, 97140 manual therapy, 97010 moist heat, 97010 cryotherapy, 97034 contrast bath, 97760 Orthotics management and training, 13086 Splinting (initial encounter), [id number] Subsequent splinting/medication, Dry needling, coping strategies training, patient/family education, and DME and/or AE instructions  CONSULTED AND AGREED WITH PLAN OF CARE: Patient and family member/caregiver  PLAN FOR NEXT SESSION:   See back in 2 weeks, check new orthosis and updated as needed, check tenderness, motion, etc.   Fannie Knee, OTR/L, CHT 02/12/2023, 5:29 PM

## 2023-02-09 ENCOUNTER — Encounter: Payer: Medicare Other | Admitting: Rehabilitative and Restorative Service Providers"

## 2023-02-12 ENCOUNTER — Ambulatory Visit (INDEPENDENT_AMBULATORY_CARE_PROVIDER_SITE_OTHER): Payer: Medicare Other | Admitting: Rehabilitative and Restorative Service Providers"

## 2023-02-12 ENCOUNTER — Encounter: Payer: Self-pay | Admitting: Rehabilitative and Restorative Service Providers"

## 2023-02-12 DIAGNOSIS — M79644 Pain in right finger(s): Secondary | ICD-10-CM | POA: Diagnosis not present

## 2023-02-12 DIAGNOSIS — M25541 Pain in joints of right hand: Secondary | ICD-10-CM

## 2023-02-12 DIAGNOSIS — R600 Localized edema: Secondary | ICD-10-CM

## 2023-02-12 DIAGNOSIS — M6281 Muscle weakness (generalized): Secondary | ICD-10-CM

## 2023-02-12 DIAGNOSIS — R278 Other lack of coordination: Secondary | ICD-10-CM

## 2023-02-14 ENCOUNTER — Other Ambulatory Visit: Payer: Self-pay

## 2023-02-14 ENCOUNTER — Ambulatory Visit: Payer: Medicare Other | Admitting: Family Medicine

## 2023-02-14 VITALS — BP 134/78 | HR 84 | Ht 62.0 in | Wt 110.0 lb

## 2023-02-14 DIAGNOSIS — M65311 Trigger thumb, right thumb: Secondary | ICD-10-CM | POA: Diagnosis not present

## 2023-02-14 DIAGNOSIS — M79644 Pain in right finger(s): Secondary | ICD-10-CM | POA: Diagnosis not present

## 2023-02-14 DIAGNOSIS — G8929 Other chronic pain: Secondary | ICD-10-CM | POA: Diagnosis not present

## 2023-02-14 NOTE — Progress Notes (Signed)
[name], PhD, LAT, ATC acting as a scribe for [name], MD.  Cassidy Bennett is a 82 y.o. female who presents to [facility] at [facility] today for R hand pain x 5 wks. Hx of R thumb CMC OA and triggering. Has been seen by Dr. [name] on 10/13/22. She notes limited AROM w/ opposition. Pt locates pain to R 1st CMC and along the radial aspect of her wrist. She notes swelling. She also has pain along the medial border of her R scapula. Pain will radiating down her whole R arm. RHD.  Her dominant problem today is trigger thumb.  She notes pain at the palmar first MCP and triggering with flexion of the IP joint.  She has been seen for this problem by Dr. [name] and had injections for it.  Most recent injection was in August which helped a little.  Grip strength: decreased Aggravates: opposition, ext Treatments tried: hand therapy x 2 visits, prior steroid injection  Dx imaging: 01/30/23 R scapula  09/25/22 R thumb XR  09/19/19 R hand XR  Pertinent review of systems: No fevers or chills  Relevant historical information: Thumb   Exam:  BP 134/78   Pulse 84   Ht 5\' 2"  (1.575 m)   Wt 110 lb (49.9 kg)   SpO2 98%   BMI 20.12 kg/m  General: Well Developed, well nourished, and in no acute distress.   MSK: Right hand bossing at first [facility].  Some swelling at palmar first MCP. Tender palpation palmar MCP.  Triggering present with flexion of IP joint.    Lab and Radiology Results  Procedure: Real-time Ultrasound Guided Injection of right thumb A1 pulley tendon sheath Device: Philips Affiniti 50G/GE Logiq Images permanently stored and available for review in PACS Verbal informed consent obtained.  Discussed risks and benefits of procedure. Warned about infection, bleeding, hyperglycemia damage to structures among others. Patient expresses understanding and agreement Time-out conducted.   Noted no overlying erythema, induration, or other signs of local  infection.   Skin prepped in a sterile fashion.   Local anesthesia: Topical Ethyl chloride.   With sterile technique and under real time ultrasound guidance: 30 mg of Kenalog and 0.75 mL of lidocaine injected into tendon sheath. Fluid seen entering the tendon sheath.   Completed without difficulty   Pain immediately resolved suggesting accurate placement of the medication.   Advised to call if fevers/chills, erythema, induration, drainage, or persistent bleeding.   Images permanently stored and available for review in the ultrasound unit.  Impression: Technically successful ultrasound guided injection.        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  Plan for repeat injection today.  Previous injection was in August with Dr. [name].  Okay to resume hand therapy.  If this does not work I would recommend surgery as the next step.  Her insurance prefers [name] which is why she is seeing me instead of Dr. [name] today.  Will refer to [name]  (Dr [name]) [name] if not improved.   PDMP not reviewed this encounter. Orders Placed This Encounter  Procedures   [place] LIMITED JOINT SPACE STRUCTURES UP RIGHT(NO LINKED CHARGES)    Reason for Exam (SYMPTOM  OR DIAGNOSIS REQUIRED):   right thumb pain    Preferred imaging location?:   [facility]   No orders of the defined types were placed in  this encounter.    Discussed warning signs or symptoms. Please see discharge instructions. Patient expresses understanding.   The above documentation has been reviewed and is accurate and complete [name], M.D.

## 2023-02-14 NOTE — Patient Instructions (Addendum)
Thank you for coming in today.   You received an injection today. Seek immediate medical attention if the joint becomes red, extremely painful, or is oozing fluid.   If not better let me know. I will refer you to Anshul "Ash" [name], MD

## 2023-02-26 NOTE — Therapy (Addendum)
 OUTPATIENT OCCUPATIONAL THERAPY TREATMENT & DISCHARGE NOTE  Patient Name: Cassidy Bennett MRN: [id number] DOB:Dec 18, 1940, 82 y.o., female Today's Date: 02/27/2023  PCP: Job Lukes, PA  REFERRING PROVIDER: Job Lukes, PA               OCCUPATIONAL THERAPY DISCHARGE SUMMARY  Visits from Start of Care: 3  Pt did not return to therapy, so goals could not be addressed.   Pt now officially discharged form OP OT.  See note below for additional details.   Melvenia Ada, OTR/L, CHT 10/25/23             END OF SESSION:  OT End of Session - 02/27/23 1155     Visit Number 3    Number of Visits 5    Date for OT Re-Evaluation 03/16/23    Authorization Type Medicare    OT Start Time 1155    OT Stop Time 1224    OT Time Calculation (min) 29 min    Equipment Utilized During Treatment --    Activity Tolerance Patient tolerated treatment well;No increased pain;Patient limited by pain    Behavior During Therapy [facility] for tasks assessed/performed               Past Medical History:  Diagnosis Date   Allergy    Complication of anesthesia    History of TB (tuberculosis)    treated 45 plus years ago last cxr 03-15-18  CXR every 5 years   Hypercholesteremia    denies at preop   Hypertension    Osteoarthritis    Osteoporosis    PONV (postoperative nausea and vomiting)    Post-menopausal    Tuberculosis    Past Surgical History:  Procedure Laterality Date   CATARACT EXTRACTION  2018   right eye   COLONOSCOPY  2015   DILATION AND CURETTAGE OF UTERUS  2000   KNEE ARTHROSCOPY  2014   left   LUMBAR DISC SURGERY     SHOULDER SURGERY     right   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   Procedure: TOTAL KNEE ARTHROPLASTY;  Surgeon: Melodi Lerner, MD;  Location: [facility];  Service: Orthopedics;  Laterality: Left;   Patient Active Problem List   Diagnosis Date Noted   Trigger thumb, right thumb 02/14/2023   Osteoarthritis of left knee  08/12/2018   Closed displaced fracture of proximal phalanx of right great toe 01/03/2017   Rotator cuff syndrome of right shoulder 01/03/2017   Hyperlipidemia 09/05/2016   Essential hypertension 08/27/2015   Urine abnormality 03/29/2015   Routine general medical examination at a health care facility 06/26/2013   History of TB (tuberculosis) 06/17/2012   Seasonal allergies 01/22/2008   Osteoporosis 12/13/2006   OA (osteoarthritis) of knee 10/11/2006    ONSET DATE: Pain off-and-on for years (acute on chronic)  REFERRING DIAG: M79.644 (ICD-10-CM) - Thumb pain, right   THERAPY DIAG:  Thumb pain, right  Localized edema  Other lack of coordination  Pain in joint of right hand  Muscle weakness (generalized)  Stiffness of right shoulder, not elsewhere classified  Rationale for Evaluation and Treatment: Rehabilitation  PERTINENT HISTORY: Per provider notes: Pt c/o pain right thumb and pain is radiating up into arm, she saw a hand specialist and was told next step surgery and she does not want surgery, would like to see someone else.  She states having past injections in the thumb for this issue, but now she is swollen at the volar MCP joint, tender to  touch, decreased ability to flex her thumb with a catching sensation that pops and locks.    PRECAUTIONS: None;  RED FLAGS: None    SUBJECTIVE:   SUBJECTIVE STATEMENT: She states having an injection from Dr. Joane recently and since then pain is much better now and she is no longer sore to the touch but she still has some triggering.  She continues to wear a Band-Aid in her brace which is appropriate   Pt accompanied by: Husband   WEIGHT BEARING RESTRICTIONS: No  PAIN:  Are you having pain?  None now   PATIENT GOALS: Decrease pain and problems with the right dominant hand  NEXT MD VISIT: As needed    OBJECTIVE: (All objective assessments below are from initial evaluation on: 02/01/23 unless otherwise specified.)    HAND DOMINANCE: Right   ADLs: Overall ADLs: States decreased ability to grab, hold household objects, pain and difficulty to open containers, perform FMS tasks (manipulate fasteners on clothing)   FUNCTIONAL OUTCOME MEASURES: Eval: Quick DASH 36% impairment today  (Higher % Score  =  More Impairment)    UPPER EXTREMITY ROM     Shoulder to Wrist AROM Right eval Left eval Rt 02/27/23  Wrist flexion 50 60 62  Wrist extension 62 70 64  (Blank rows = not tested)   Hand AROM Right eval Left eval Rt 02/27/23  Full Fist Ability (or Gap to Distal Palmar Crease) full    Thumb MCP (0-60) 0- 38 0- 52 0 - 45  Thumb IP (0-80) 0- 26 0- 54 0 - 13  (Blank rows = not tested)   HAND FUNCTION: 02/27/23: Rt grip: 8#,   Lt grip: 22#    COORDINATION: Eval: Observed coordination impairments with affected right hand, as seen by difficulty opposing and moving the thumb.  Details will be tested as needed  OBSERVATIONS:   Eval: Mildly swollen in right thumb volar MCP joint and tender to touch, OT can palpate triggering and locking sensations as she moves the IP joint.   TODAY'S TREATMENT:  02/27/23: She performs active range of motion for new measures as well as review of exercises showing improvement now at the wrist and MP joint of the thumb.  This is a great indicator that she is actually doing better and recovering, as well as decreased tenderness to palpation and decreased pain complaints.  However, she still is triggering lightly even after injection, so she was given repeat education for self-care/safety to avoid this problem by using various braces including the Band-Aid method, as well as continue gentle stretches for the next 3 to 5 weeks at least.  If she continues to do well conservatively she may not need to come in for additional therapy, but she is welcome to come back anytime that she needs support or assistance.    She was given the following printed out materials to help explain  and guide her through this process:   You are getting better because your motion is better and you are less tender and swollen.   You still need to avoid "the noise" or the "popping" for at least 3-6 more weeks.     Hopefully, by that time it stops "triggering."     You still need to be careful once it stops, because too much use of your thumb can make this problem return.     So be very aware of any tenderness or "popping," and continue to massage, gently stretch, and prevent this from happening.  If you can't manage it or "control" it, and especially if it becomes red, swollen, or tender again, then it may be time to consider a small surgery for the thumb.       All of her home exercises were reviewed today with her and her husband and she leaves in no pain stating understanding.   PATIENT EDUCATION: Education details: See tx section above for details  Person educated: Patient Education method: Verbal Instruction, Teach back, Handouts  Education comprehension: States and demonstrates understanding, Additional Education required    HOME EXERCISE PROGRAM: Access Code: [email or web address] URL: [email or web address] Date: 02/01/2023 Prepared by: Melvenia Ada   GOALS: Goals reviewed with patient? Yes   SHORT TERM GOALS: (STG required if POC>30 days) Target Date: 02/01/2023  Pt will obtain protective, custom orthotic. Goal status: 02/12/2023: Met  2.  Pt will demo/state understanding of initial HEP to improve pain levels and prerequisite motion. Goal status: 02/12/2023: Met   LONG TERM GOALS: Target Date: 03/16/2023  Pt will improve A/ROM in right thumb flexion at the MCP and IP joint from 64* total to at least 80*, to have functional motion for tasks like reach and grasp.  Goal status: INITIAL  2.  Pt will decrease pain at worst from 9/10 to 3/10 or better to have better sleep and occupational participation in daily roles. Goal status:  INITIAL  ASSESSMENT:  CLINICAL IMPRESSION: 02/27/23: Today she has little to no pain and little to no tenderness to palpation, but still have palpable triggering at the thumb MCP joint.  She is only a little over 3 weeks into this process and she was warned that this process can take 6 to 8 weeks to fully recover, if all goes well.  We will plan on not necessarily seeing her again in therapy unless she has an exacerbation or problem or concern, as she is doing so well with self-management.  She was urged to return if she has any issues, otherwise if this problem cannot be managed conservatively she should seek surgical options.    PLAN:  OT FREQUENCY: 1-2x/week  OT DURATION: 6 weeks through 03/16/2023 and up to 5 total visits, but she may only need 1-2 more visits in total if she can manage this program well  PLANNED INTERVENTIONS: 97168 OT Re-evaluation, 97535 self care/ADL training, 02889 therapeutic exercise, 97530 therapeutic activity, 97140 manual therapy, 97010 moist heat, 97010 cryotherapy, 97034 contrast bath, 97760 Orthotics management and training, 02239 Splinting (initial encounter), [phone] Subsequent splinting/medication, Dry needling, coping strategies training, patient/family education, and DME and/or AE instructions  CONSULTED AND AGREED WITH PLAN OF CARE: Patient and family member/caregiver  PLAN FOR NEXT SESSION:   See her back as needed, and if not seen in the next 2 months she can be discharged assuming all goals were met.   Melvenia Ada, OTR/L, CHT 02/27/2023, 5:05 PM

## 2023-02-27 ENCOUNTER — Encounter: Payer: Self-pay | Admitting: Rehabilitative and Restorative Service Providers"

## 2023-02-27 ENCOUNTER — Ambulatory Visit (INDEPENDENT_AMBULATORY_CARE_PROVIDER_SITE_OTHER): Payer: Medicare Other | Admitting: Rehabilitative and Restorative Service Providers"

## 2023-02-27 ENCOUNTER — Telehealth: Payer: Self-pay | Admitting: Physician Assistant

## 2023-02-27 DIAGNOSIS — M25611 Stiffness of right shoulder, not elsewhere classified: Secondary | ICD-10-CM

## 2023-02-27 DIAGNOSIS — M6281 Muscle weakness (generalized): Secondary | ICD-10-CM | POA: Diagnosis not present

## 2023-02-27 DIAGNOSIS — R600 Localized edema: Secondary | ICD-10-CM | POA: Diagnosis not present

## 2023-02-27 DIAGNOSIS — R278 Other lack of coordination: Secondary | ICD-10-CM | POA: Diagnosis not present

## 2023-02-27 DIAGNOSIS — M79644 Pain in right finger(s): Secondary | ICD-10-CM | POA: Diagnosis not present

## 2023-02-27 DIAGNOSIS — M25541 Pain in joints of right hand: Secondary | ICD-10-CM | POA: Diagnosis not present

## 2023-02-27 NOTE — Telephone Encounter (Signed)
PATIENT IS REQUESTING A REFILL BE SENT TO [facility] FOR HER amLODipine 10mg      Thank you  Gabriel Cirri [facility] AWV TEAM Direct Dial [phone]

## 2023-03-09 ENCOUNTER — Other Ambulatory Visit: Payer: Self-pay

## 2023-03-09 DIAGNOSIS — I10 Essential (primary) hypertension: Secondary | ICD-10-CM

## 2023-03-09 MED ORDER — AMLODIPINE BESYLATE 10 MG PO TABS
10.0000 mg | ORAL_TABLET | Freq: Every day | ORAL | 1 refills | Status: DC
Start: 2023-03-09 — End: 2023-08-06

## 2023-03-09 NOTE — Telephone Encounter (Signed)
 Sent to pts pharmacy

## 2023-03-09 NOTE — Telephone Encounter (Signed)
 Prescription Request  03/09/2023  LOV: 01/30/2023  What is the name of the medication or equipment? amLODipine  (NORVASC ) 10 MG tablet [[id number]]   Have you contacted your pharmacy to request a refill? Yes   Which pharmacy would you like this sent to?  [facility] [place], [place] - [address] [place] [address] Phone: [phone] Fax: [phone]    Patient notified that their request is being sent to the clinical staff for review and that they should receive a response within 2 business days.   Please advise at Mobile [phone] (mobile)

## 2023-04-09 ENCOUNTER — Ambulatory Visit: Payer: Medicare Other

## 2023-04-09 ENCOUNTER — Ambulatory Visit: Payer: Medicare Other | Admitting: Physician Assistant

## 2023-04-09 ENCOUNTER — Encounter: Payer: Self-pay | Admitting: Physician Assistant

## 2023-04-09 VITALS — BP 130/70 | HR 82 | Temp 98.1°F | Ht 62.0 in | Wt 109.0 lb

## 2023-04-09 DIAGNOSIS — R202 Paresthesia of skin: Secondary | ICD-10-CM

## 2023-04-09 DIAGNOSIS — G8929 Other chronic pain: Secondary | ICD-10-CM

## 2023-04-09 DIAGNOSIS — M545 Low back pain, unspecified: Secondary | ICD-10-CM

## 2023-04-09 DIAGNOSIS — M48061 Spinal stenosis, lumbar region without neurogenic claudication: Secondary | ICD-10-CM | POA: Diagnosis not present

## 2023-04-09 DIAGNOSIS — M7732 Calcaneal spur, left foot: Secondary | ICD-10-CM | POA: Diagnosis not present

## 2023-04-09 DIAGNOSIS — M51369 Other intervertebral disc degeneration, lumbar region without mention of lumbar back pain or lower extremity pain: Secondary | ICD-10-CM | POA: Diagnosis not present

## 2023-04-09 DIAGNOSIS — M47816 Spondylosis without myelopathy or radiculopathy, lumbar region: Secondary | ICD-10-CM | POA: Diagnosis not present

## 2023-04-09 DIAGNOSIS — M79672 Pain in left foot: Secondary | ICD-10-CM | POA: Diagnosis not present

## 2023-04-09 DIAGNOSIS — M549 Dorsalgia, unspecified: Secondary | ICD-10-CM | POA: Diagnosis not present

## 2023-04-09 NOTE — Progress Notes (Signed)
 Cassidy Bennett is a 83 y.o. female here for a new problem.  History of Present Illness:   Chief Complaint  Patient presents with   Tingling    Pt c/o tingling in left ankle and toes, worse at night, x several months.    HPI  Tingling in toes and foot pain Patient complains today of a tingling sensation in her left toes that has persisted for the past several months. Also reports accompanying SI joint pain.  Tingling sensation tends to worsen at night. She would occasionally walk up to due to her symptoms.  Symptoms would typically resolve when waking up.   She does reports pain around the left heel region, stating she was unable to move or walk at times.   She does report daily indoor biking and occasional use of a treadmill but states that she doesn't experience any symptoms when using machines.  She does endorse mild back pain and states she often sleeps on her stomach. She did undergo a knee surgery on June of 2019 and back surgery 15-16 years ago.   She does report taking tylenol , Advil, aspirin  as needed which is almost daily before bedtime.  Also reports taking gabapentin  100 mg daily. She states that she occasionally takes a double dose but she forgets too due to memory issues.   Does repot occasional tingling in the right toes but left tends to be almost daily.  She is requesting a back and foot X-rays today.   Past Medical History:  Diagnosis Date   Allergy    Complication of anesthesia    History of TB (tuberculosis)    treated 45 plus years ago last cxr 03-15-18  CXR every 5 years   Hypercholesteremia    denies at preop   Hypertension    Osteoarthritis    Osteoporosis    PONV (postoperative nausea and vomiting)    Post-menopausal    Tuberculosis      Social History   Tobacco Use   Smoking status: Never   Smokeless tobacco: Never  Vaping Use   Vaping status: Never Used  Substance Use Topics   Alcohol use: Not Currently    Alcohol/week: 5.0 standard  drinks of alcohol    Types: 5 Glasses of wine per week   Drug use: No    Past Surgical History:  Procedure Laterality Date   CATARACT EXTRACTION  2018   right eye   COLONOSCOPY  2015   DILATION AND CURETTAGE OF UTERUS  2000   KNEE ARTHROSCOPY  2014   left   LUMBAR DISC SURGERY     SHOULDER SURGERY     right   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   Procedure: TOTAL KNEE ARTHROPLASTY;  Surgeon: Liliane Rei, MD;  Location: [facility];  Service: Orthopedics;  Laterality: Left;    Family History  Problem Relation Age of Onset   Other Mother        malaria   Other Father    Cancer Other        stomach   Breast cancer Neg Hx     Allergies  Allergen Reactions   Metoprolol  Nausea And Vomiting   Morphine  And Codeine Nausea And Vomiting   Other Nausea And Vomiting    Current Medications:   Current Outpatient Medications:    amLODipine  (NORVASC ) 10 MG tablet, Take 1 tablet (10 mg total) by mouth daily., Disp: 90 tablet, Rfl: 1   Ascorbic Acid (VITAMIN C PO), Take 1,000 mg by mouth  daily. , Disp: , Rfl:    b complex vitamins tablet, Take 1 tablet by mouth daily. Every other day, Disp: , Rfl:    Calcium Carb-Cholecalciferol (CALCIUM 600 + D PO), Take 1 tablet by mouth daily., Disp: , Rfl:    cholecalciferol (VITAMIN D3) 25 MCG (1000 UNIT) tablet, Take 2,000 Units by mouth daily., Disp: , Rfl:    gabapentin  (NEURONTIN ) 100 MG capsule, Take 100 mg by mouth at bedtime., Disp: , Rfl:    magnesium 30 MG tablet, Take 30 mg by mouth 3 (three) times a week., Disp: , Rfl:    polyethylene glycol (MIRALAX  / GLYCOLAX ) packet, Take 17 g by mouth daily as needed for moderate constipation. , Disp: , Rfl:    Vitamin A 2400 MCG (8000 UT) TABS, Take 8,000 Units by mouth daily. , Disp: , Rfl:    vitamin B-12 (CYANOCOBALAMIN ) 1000 MCG tablet, Take 1,000 mcg by mouth daily., Disp: , Rfl:    vitamin E 400 UNIT capsule, Take 400 Units by mouth daily., Disp: , Rfl:    Review of Systems:   Review of  Systems  Musculoskeletal:  Positive for back pain and joint pain.  Neurological:  Positive for tingling.    Vitals:   Vitals:   04/09/23 1457  BP: 130/70  Pulse: 82  Temp: 98.1 F (36.7 C)  TempSrc: Temporal  SpO2: 98%  Weight: 109 lb (49.4 kg)  Height: 5\' 2"  (1.575 m)     Body mass index is 19.94 kg/m.  Physical Exam:   Physical Exam Vitals and nursing note reviewed.  Constitutional:      General: She is not in acute distress.    Appearance: She is well-developed. She is not ill-appearing or toxic-appearing.  Cardiovascular:     Rate and Rhythm: Normal rate and regular rhythm.     Pulses: Normal pulses.          Dorsalis pedis pulses are 2+ on the right side and 2+ on the left side.       Posterior tibial pulses are 2+ on the right side and 2+ on the left side.     Heart sounds: Normal heart sounds, S1 normal and S2 normal.  Pulmonary:     Effort: Pulmonary effort is normal.     Breath sounds: Normal breath sounds.  Musculoskeletal:     Right foot: No deformity or bunion.     Left foot: No deformity or bunion.     Comments: Normal range of motion of back No spinal tenderness  Feet:     Right foot:     Skin integrity: No ulcer, blister or skin breakdown.     Left foot:     Skin integrity: No ulcer, blister or skin breakdown.     Comments: Tenderness to palpation to left heel  without any obvious abnormality Skin:    General: Skin is warm and dry.  Neurological:     Mental Status: She is alert.     GCS: GCS eye subscore is 4. GCS verbal subscore is 5. GCS motor subscore is 6.  Psychiatric:        Speech: Speech normal.        Behavior: Behavior normal. Behavior is cooperative.     Assessment and Plan:   Chronic bilateral low back pain without sciatica Patient requesting imaging Discussed that this could be the culprit of her tingling in her foot Recommend likely physical therapy based on results  Chronic heel pain, left Unclear etiology  She would  like imaging Consider physical therapy vs sports medicine based on results  Tingling sensation Possibly due to back Blood work up to date Could consider updating B-12 or TSH if persists She may trial increasing gabapentin  to 200 mg Follow-up based on results and clinical symptom(s)   Alexander Iba, PA-C  Scot Cutter M Kadhim,acting as a scribe for Alexander Iba, PA.,have documented all relevant documentation on the behalf of Alexander Iba, PA,as directed by  Alexander Iba, PA while in the presence of Alexander Iba, [place].   I, Alexander Iba, [place], have reviewed all documentation for this visit. The documentation on 04/09/23 for the exam, diagnosis, procedures, and orders are all accurate and complete.

## 2023-04-09 NOTE — Patient Instructions (Signed)
 It was great to see you!  We are getting  X-rays and will be in touch with results and plan  Consider increasing gabapentin  to 200 mg nightly  Take care,  Price Lachapelle PA-C

## 2023-04-23 ENCOUNTER — Other Ambulatory Visit: Payer: Self-pay | Admitting: *Deleted

## 2023-04-23 DIAGNOSIS — M7732 Calcaneal spur, left foot: Secondary | ICD-10-CM

## 2023-05-01 ENCOUNTER — Ambulatory Visit: Payer: Medicare Other

## 2023-05-10 ENCOUNTER — Encounter: Payer: Self-pay | Admitting: Physical Therapy

## 2023-05-10 ENCOUNTER — Ambulatory Visit

## 2023-05-10 ENCOUNTER — Ambulatory Visit (INDEPENDENT_AMBULATORY_CARE_PROVIDER_SITE_OTHER): Admitting: Physical Therapy

## 2023-05-10 DIAGNOSIS — M25572 Pain in left ankle and joints of left foot: Secondary | ICD-10-CM

## 2023-05-10 DIAGNOSIS — M79672 Pain in left foot: Secondary | ICD-10-CM | POA: Diagnosis not present

## 2023-05-10 DIAGNOSIS — M810 Age-related osteoporosis without current pathological fracture: Secondary | ICD-10-CM

## 2023-05-10 MED ORDER — DENOSUMAB 60 MG/ML ~~LOC~~ SOSY
60.0000 mg | PREFILLED_SYRINGE | Freq: Once | SUBCUTANEOUS | Status: AC
Start: 2023-05-10 — End: 2023-05-10
  Administered 2023-05-10: 60 mg via SUBCUTANEOUS

## 2023-05-10 NOTE — Progress Notes (Signed)
 Patient is in office today for a nurse visit for  Prolia injection . Patient Injection was given in the  Left arm. Patient tolerated injection well.

## 2023-05-10 NOTE — Therapy (Signed)
 OUTPATIENT PHYSICAL THERAPY LOWER EXTREMITY EVALUATION   Patient Name: Cassidy Bennett MRN: [id number] DOB:03-Jan-1941, 83 y.o., female Today's Date: 05/10/2023  END OF SESSION:  PT End of Session - 05/10/23 1614     Visit Number 1    Number of Visits 16    Date for PT Re-Evaluation 07/05/23    Authorization Type Medicare    PT Start Time 1520    PT Stop Time 1557    PT Time Calculation (min) 37 min    Activity Tolerance Patient tolerated treatment well    Behavior During Therapy WFL for tasks assessed/performed             Past Medical History:  Diagnosis Date   Allergy    Complication of anesthesia    History of TB (tuberculosis)    treated 45 plus years ago last cxr 03-15-18  CXR every 5 years   Hypercholesteremia    denies at preop   Hypertension    Osteoarthritis    Osteoporosis    PONV (postoperative nausea and vomiting)    Post-menopausal    Tuberculosis    Past Surgical History:  Procedure Laterality Date   CATARACT EXTRACTION  2018   right eye   COLONOSCOPY  2015   DILATION AND CURETTAGE OF UTERUS  2000   KNEE ARTHROSCOPY  2014   left   LUMBAR DISC SURGERY     SHOULDER SURGERY     right   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   Procedure: TOTAL KNEE ARTHROPLASTY;  Surgeon: Ollen Gross, MD;  Location: [facility];  Service: Orthopedics;  Laterality: Left;   Patient Active Problem List   Diagnosis Date Noted   Trigger thumb, right thumb 02/14/2023   Osteoarthritis of left knee 08/12/2018   Closed displaced fracture of proximal phalanx of right great toe 01/03/2017   Rotator cuff syndrome of right shoulder 01/03/2017   Hyperlipidemia 09/05/2016   Essential hypertension 08/27/2015   Urine abnormality 03/29/2015   Routine general medical examination at a health care facility 06/26/2013   History of TB (tuberculosis) 06/17/2012   Seasonal allergies 01/22/2008   Osteoporosis 12/13/2006   OA (osteoarthritis) of knee 10/11/2006     PCP:  Jarold Motto   REFERRING PROVIDER: Jarold Motto   REFERRING DIAG: L heel pain   THERAPY DIAG:  Pain of left heel  Pain in left ankle and joints of left foot  Rationale for Evaluation and Treatment: Rehabilitation  ONSET DATE:    SUBJECTIVE:   SUBJECTIVE STATEMENT: Pt states pain in L heel pain, no previous pain here.  Has had pain in low back in the past, still present but improving. Also had TKA on L.  Pt states pain in L foot for about 3 months. Wearing casual sneakers today but has other , more cushioned sneakers at home, will bring next visit. She is wearing shoes around the house. States most Pain felt in heel in the morning. Also states tingling into all toes on R,  in evening , in the night and in morning. She is taking gabapentin, but reports unable to sleep more than 4 hours due to pain.  Likes to walk    PERTINENT HISTORY: L KTA, low back pain,   PAIN:  Are you having pain? Yes: NPRS scale: 5-7/10 Pain location: L heel base and back of heel Pain description: sore Aggravating factors: first thing in AM Relieving factors: none stated   PRECAUTIONS: None  WEIGHT BEARING RESTRICTIONS: No  FALLS:  Has  patient fallen in last 6 months? No   PLOF: Independent  PATIENT GOALS:  Decreased pain in heel.   NEXT MD VISIT:   OBJECTIVE:   DIAGNOSTIC FINDINGS:   PATIENT SURVEYS:    COGNITION: Overall cognitive status: Within functional limits for tasks assessed     SENSATION: WFL  EDEMA:    POSTURE:   standing: L LE longer, L knee in slight flexion,  supine: L LE appears longer   PALPATION:  pain in base of L heel with deep palpation, soreness in posterior heel, none into mid achilles, tightness in gastroc, hypomobile DF.    LOWER EXTREMITY ROM: Lumber: flex/ext: mod limitation Hips: flex: mild limitation,  ER: mod limitation bil Knees: L knee mild limitation for full extension, mild/mod for flexion  from TKE  Ankle: R: WFL,    L: mod limitation  for DF.   LOWER EXTREMITY MMT:  MMT Left eval Right  eval  Hip flexion 4 4  Hip extension    Hip abduction    Hip adduction    Hip internal rotation    Hip external rotation    Knee flexion 5 5  Knee extension 5 5  Ankle dorsiflexion    Ankle plantarflexion    Ankle inversion    Ankle eversion     (Blank rows = not tested)  LOWER EXTREMITY SPECIAL TESTS:    GAIT: Distance walked: 100 ft Assistive device utilized: None Level of assistance: Complete Independence Comments: trunk flexion, short step length, low step height.    TODAY'S TREATMENT:                                                                                                                              DATE:  05/10/2023 Ther ex: See below for HEP Manual: Manual stretch for gastroc, IASTM for gastroc   PATIENT EDUCATION:  Education details: PT POC, Exam findings, HEP, possible need for heel lift, leg length,  Person educated: Patient Education method: Explanation, Demonstration, Tactile cues, Verbal cues, and Handouts Education comprehension: verbalized understanding, returned demonstration, verbal cues required, tactile cues required, and needs further education   HOME EXERCISE PROGRAM: Access Code: [email or web address] URL: [email or web address] Date: 05/10/2023 Prepared by: Sedalia Muta  Exercises - Gastroc Stretch on Wall  - 2 x daily - 3 reps - 30 hold - Supine Active Ankle Pumps  - 2 x daily - 1 sets - 10 reps   ASSESSMENT:  CLINICAL IMPRESSION: Patient presents with primary complaint of  pain in L heel. She has tightness in L gastroc as well as limited DF ROM on L side. She has increased pain in bottom and back of heel. She has leg length longer on L vs R, with L knee in slight flexion in standing. This could be contributing to pain as well. L LE does appear longer, and L knee also with slightly Less TKE than R. Pt with decreased ability for full functional activities due to pain  in heel.  Pt will  benefit from skilled PT to improve deficits and pain and to return to PLOF.   OBJECTIVE IMPAIRMENTS: Abnormal gait, decreased activity tolerance, decreased ROM, hypomobility, impaired flexibility, improper body mechanics, and pain.   ACTIVITY LIMITATIONS: standing, sleeping, and locomotion level  PARTICIPATION LIMITATIONS: meal prep, cleaning, driving, shopping, and community activity  PERSONAL FACTORS: 1 comorbidity: posture/leg length  are also affecting patient's functional outcome.   REHAB POTENTIAL: Good  CLINICAL DECISION MAKING: Stable/uncomplicated  EVALUATION COMPLEXITY: Low   GOALS: Goals reviewed with patient? Yes  SHORT TERM GOALS: Target date: 05/24/2023  Pt to be independent with initial HEP  Goal status: INITIAL  2.  Pt to report decreased pain in L heel to 0-3/10 in AM.   Goal status: INITIAL    LONG TERM GOALS: Target date: 07/05/2023   Pt to be independent with final HEP  Goal status: INITIAL  2.  Pt to report decreased pain to 0-2/10 in am and with activity.   Goal status: INITIAL  3.  Pt to demo improved L ankle DF by at least 4 degrees.   Goal status: INITIAL  4.  Pt to demo optimal gait mechanics for pt age, with longer stride length.   Goal status: INITIAL  5.  Pt to be compliant with footwear and/or orthotic/heel lift as appropriate for foot type and leg length.   Goal status: INITIAL   PLAN:  PT FREQUENCY: 1-2x/week  PT DURATION: 8 weeks  PLANNED INTERVENTIONS: Therapeutic exercises, Therapeutic activity, Neuromuscular re-education, Patient/Family education, Self Care, Joint mobilization, Joint manipulation, Stair training, Orthotic/Fit training, DME instructions, Aquatic Therapy, Dry Needling, Electrical stimulation, Cryotherapy, Moist heat, Taping, Ultrasound, Ionotophoresis 4mg /ml Dexamethasone, Manual therapy,  Vasopneumatic device, Traction, Spinal manipulation, Spinal mobilization,Balance training, Gait  training,   PLAN FOR NEXT SESSION:    Sedalia Muta, PT, DPT 4:25 PM  05/10/23

## 2023-05-15 ENCOUNTER — Encounter: Payer: Medicare Other | Admitting: Physical Therapy

## 2023-05-18 ENCOUNTER — Encounter: Payer: Self-pay | Admitting: Physical Therapy

## 2023-05-18 ENCOUNTER — Ambulatory Visit (INDEPENDENT_AMBULATORY_CARE_PROVIDER_SITE_OTHER): Admitting: Physical Therapy

## 2023-05-18 DIAGNOSIS — M79672 Pain in left foot: Secondary | ICD-10-CM | POA: Diagnosis not present

## 2023-05-18 DIAGNOSIS — M25572 Pain in left ankle and joints of left foot: Secondary | ICD-10-CM

## 2023-05-18 NOTE — Therapy (Signed)
 OUTPATIENT PHYSICAL THERAPY LOWER EXTREMITY TREATMENT   Patient Name: Cassidy Bennett MRN: [id number] DOB:11/15/1940, 83 y.o., female Today's Date: 05/18/2023  END OF SESSION:  PT End of Session - 05/18/23 1019     Visit Number 2    Number of Visits 16    Date for PT Re-Evaluation 07/05/23    Authorization Type Medicare    PT Start Time 1020    PT Stop Time 1100    PT Time Calculation (min) 40 min    Activity Tolerance Patient tolerated treatment well    Behavior During Therapy WFL for tasks assessed/performed             Past Medical History:  Diagnosis Date   Allergy    Complication of anesthesia    History of TB (tuberculosis)    treated 45 plus years ago last cxr 03-15-18  CXR every 5 years   Hypercholesteremia    denies at preop   Hypertension    Osteoarthritis    Osteoporosis    PONV (postoperative nausea and vomiting)    Post-menopausal    Tuberculosis    Past Surgical History:  Procedure Laterality Date   CATARACT EXTRACTION  2018   right eye   COLONOSCOPY  2015   DILATION AND CURETTAGE OF UTERUS  2000   KNEE ARTHROSCOPY  2014   left   LUMBAR DISC SURGERY     SHOULDER SURGERY     right   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   Procedure: TOTAL KNEE ARTHROPLASTY;  Surgeon: Ollen Gross, MD;  Location: [facility];  Service: Orthopedics;  Laterality: Left;   Patient Active Problem List   Diagnosis Date Noted   Trigger thumb, right thumb 02/14/2023   Osteoarthritis of left knee 08/12/2018   Closed displaced fracture of proximal phalanx of right great toe 01/03/2017   Rotator cuff syndrome of right shoulder 01/03/2017   Hyperlipidemia 09/05/2016   Essential hypertension 08/27/2015   Urine abnormality 03/29/2015   Routine general medical examination at a health care facility 06/26/2013   History of TB (tuberculosis) 06/17/2012   Seasonal allergies 01/22/2008   Osteoporosis 12/13/2006   OA (osteoarthritis) of knee 10/11/2006     PCP:  Jarold Motto   REFERRING PROVIDER: Jarold Motto   REFERRING DIAG: L heel pain   THERAPY DIAG:  Pain of left heel  Pain in left ankle and joints of left foot  Rationale for Evaluation and Treatment: Rehabilitation  ONSET DATE:    SUBJECTIVE:   SUBJECTIVE STATEMENT: Pt states heel pain better. Now feeling more pain into forefoot  and tingling into toes at night and in the morning.    Eval: Pt states pain in L heel pain, no previous pain here.  Has had pain in low back in the past, still present but improving. Also had TKA on L.  Pt states pain in L foot for about 3 months. Wearing casual sneakers today but has other , more cushioned sneakers at home, will bring next visit. She is wearing shoes around the house. States most Pain felt in heel in the morning. Also states tingling into all toes on R,  in evening , in the night and in morning. She is taking gabapentin, but reports unable to sleep more than 4 hours due to pain.  Likes to walk    PERTINENT HISTORY: L KTA, low back pain,   PAIN:  Are you having pain? Yes: NPRS scale: 5-7/10 Pain location: L heel base and back of heel  Pain description: sore Aggravating factors: first thing in AM Relieving factors: none stated   PRECAUTIONS: None  WEIGHT BEARING RESTRICTIONS: No  FALLS:  Has patient fallen in last 6 months? No   PLOF: Independent  PATIENT GOALS:  Decreased pain in heel.   NEXT MD VISIT:   OBJECTIVE:   DIAGNOSTIC FINDINGS:   PATIENT SURVEYS:    COGNITION: Overall cognitive status: Within functional limits for tasks assessed     SENSATION: WFL  EDEMA:    POSTURE:   standing: L LE longer, L knee in slight flexion,  supine: L LE appears longer   PALPATION:  pain in base of L heel with deep palpation, soreness in posterior heel, none into mid achilles, tightness in gastroc, hypomobile DF.    LOWER EXTREMITY ROM: Lumber: flex/ext: mod limitation Hips: flex: mild limitation,  ER: mod  limitation bil Knees: L knee mild limitation for full extension, mild/mod for flexion  from TKE  Ankle: R: WFL,    L: mod limitation for DF.   LOWER EXTREMITY MMT:  MMT Left eval Right  eval  Hip flexion 4 4  Hip extension    Hip abduction    Hip adduction    Hip internal rotation    Hip external rotation    Knee flexion 5 5  Knee extension 5 5  Ankle dorsiflexion    Ankle plantarflexion    Ankle inversion    Ankle eversion     (Blank rows = not tested)  LOWER EXTREMITY SPECIAL TESTS:    GAIT: Distance walked: 100 ft Assistive device utilized: None Level of assistance: Complete Independence Comments: trunk flexion, short step length, low step height.    TODAY'S TREATMENT:                                                                                                                              DATE:   05/18/2023  Therapeutic Exercise: Aerobic: Supine:  Ankle pf/df with toe flex/ext x 15 on L for toe spreading and pl fascia stretch;  Seated: Standing: Stretches:  gastroc stretch at wall x 3,   x 2 with towel roll under big toe for pl fascia stretch;  Neuromuscular Re-education: Manual Therapy:   STM/TPR to L pl fascia (proximal and distal)  A/P ankle mobs for DF ; Knee mobs to inc Extension Therapeutic Activity: Self Care: Gait:  pregait: standing posture- education for equal weight bearing through both feet with L/R weight shfits.  Staggered stance weight shifts x 10 bil;  step throughs with L x 15;   Ambulation with cueing for heel to to pattern on L.  45 ft x 8;    Ther ex: See below for HEP Manual: Manual stretch for gastroc, IASTM for gastroc   PATIENT EDUCATION:  Education details: updated and reviewed HEP Person educated: Patient Education method: Explanation, Demonstration, Tactile cues, Verbal cues, and Handouts Education comprehension: verbalized understanding, returned demonstration, verbal cues required, tactile cues  required, and needs further  education   HOME EXERCISE PROGRAM: Access Code: [email or web address] URL: [email or web address] Date: 05/10/2023 Prepared by: Sedalia Muta  Exercises - Gastroc Stretch on Wall  - 2 x daily - 3 reps - 30 hold - Supine Active Ankle Pumps  - 2 x daily - 1 sets - 10 reps   ASSESSMENT:  CLINICAL IMPRESSION: 05/18/2023 Pt with good tolerance for activities today. Education and practice for improving heel to toe pattern on L, pt with good ability for performing at end of session, she does feel more pull in back of L leg, hamstring and glute after practice. She has more pain into pl fascia- proximal/mid today, tender with palpation, but did improve after manual and stretching. Heel pain still present with deep palpation, but improved overall. Plan to progress as tolerated.   Eval: Patient presents with primary complaint of  pain in L heel. She has tightness in L gastroc as well as limited DF ROM on L side. She has increased pain in bottom and back of heel. She has leg length longer on L vs R, with L knee in slight flexion in standing. This could be contributing to pain as well. L LE does appear longer, and L knee also with slightly Less TKE than R. Pt with decreased ability for full functional activities due to pain in heel. Pt will  benefit from skilled PT to improve deficits and pain and to return to PLOF.   OBJECTIVE IMPAIRMENTS: Abnormal gait, decreased activity tolerance, decreased ROM, hypomobility, impaired flexibility, improper body mechanics, and pain.   ACTIVITY LIMITATIONS: standing, sleeping, and locomotion level  PARTICIPATION LIMITATIONS: meal prep, cleaning, driving, shopping, and community activity  PERSONAL FACTORS: 1 comorbidity: posture/leg length  are also affecting patient's functional outcome.   REHAB POTENTIAL: Good  CLINICAL DECISION MAKING: Stable/uncomplicated  EVALUATION COMPLEXITY: Low   GOALS: Goals reviewed with patient? Yes  SHORT TERM GOALS: Target  date: 05/24/2023  Pt to be independent with initial HEP  Goal status: INITIAL  2.  Pt to report decreased pain in L heel to 0-3/10 in AM.   Goal status: INITIAL    LONG TERM GOALS: Target date: 07/05/2023   Pt to be independent with final HEP  Goal status: INITIAL  2.  Pt to report decreased pain to 0-2/10 in am and with activity.   Goal status: INITIAL  3.  Pt to demo improved L ankle DF by at least 4 degrees.   Goal status: INITIAL  4.  Pt to demo optimal gait mechanics for pt age, with longer stride length.   Goal status: INITIAL  5.  Pt to be compliant with footwear and/or orthotic/heel lift as appropriate for foot type and leg length.   Goal status: INITIAL   PLAN:  PT FREQUENCY: 1-2x/week  PT DURATION: 8 weeks  PLANNED INTERVENTIONS: Therapeutic exercises, Therapeutic activity, Neuromuscular re-education, Patient/Family education, Self Care, Joint mobilization, Joint manipulation, Stair training, Orthotic/Fit training, DME instructions, Aquatic Therapy, Dry Needling, Electrical stimulation, Cryotherapy, Moist heat, Taping, Ultrasound, Ionotophoresis 4mg /ml Dexamethasone, Manual therapy,  Vasopneumatic device, Traction, Spinal manipulation, Spinal mobilization,Balance training, Gait training,   PLAN FOR NEXT SESSION: ankle DF, gastroc and pl fascia tightness, possible heel lift on R and/or met pad on L if needed, Continue heel to toe pattern,  HS stretch if needed.    Sedalia Muta, PT, DPT 1:45 PM  05/18/23

## 2023-05-23 ENCOUNTER — Ambulatory Visit (INDEPENDENT_AMBULATORY_CARE_PROVIDER_SITE_OTHER): Admitting: Physical Therapy

## 2023-05-23 ENCOUNTER — Encounter: Payer: Self-pay | Admitting: Physical Therapy

## 2023-05-23 DIAGNOSIS — M79672 Pain in left foot: Secondary | ICD-10-CM | POA: Diagnosis not present

## 2023-05-23 DIAGNOSIS — M25572 Pain in left ankle and joints of left foot: Secondary | ICD-10-CM | POA: Diagnosis not present

## 2023-05-23 NOTE — Therapy (Signed)
 OUTPATIENT PHYSICAL THERAPY LOWER EXTREMITY TREATMENT   Patient Name: Cassidy Bennett MRN: [id number] DOB:[birth year], 83 y.o., female Today's Date: 05/23/2023  END OF SESSION:  PT End of Session - 05/23/23 1301     Visit Number 3    Number of Visits 16    Date for PT Re-Evaluation 07/05/23    Authorization Type Medicare    PT Start Time 1103    PT Stop Time 1141    PT Time Calculation (min) 38 min    Activity Tolerance Patient tolerated treatment well    Behavior During Therapy WFL for tasks assessed/performed              Past Medical History:  Diagnosis Date   Allergy    Complication of anesthesia    History of TB (tuberculosis)    treated 45 plus years ago last cxr 03-15-18  CXR every 5 years   Hypercholesteremia    denies at preop   Hypertension    Osteoarthritis    Osteoporosis    PONV (postoperative nausea and vomiting)    Post-menopausal    Tuberculosis    Past Surgical History:  Procedure Laterality Date   CATARACT EXTRACTION  2018   right eye   COLONOSCOPY  2015   DILATION AND CURETTAGE OF UTERUS  2000   KNEE ARTHROSCOPY  2014   left   LUMBAR DISC SURGERY     SHOULDER SURGERY     right   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   Procedure: TOTAL KNEE ARTHROPLASTY;  Surgeon: Ollen Gross, MD;  Location: [facility];  Service: Orthopedics;  Laterality: Left;   Patient Active Problem List   Diagnosis Date Noted   Trigger thumb, right thumb 02/14/2023   Osteoarthritis of left knee 08/12/2018   Closed displaced fracture of proximal phalanx of right great toe 01/03/2017   Rotator cuff syndrome of right shoulder 01/03/2017   Hyperlipidemia 09/05/2016   Essential hypertension 08/27/2015   Urine abnormality 03/29/2015   Routine general medical examination at a health care facility 06/26/2013   History of TB (tuberculosis) 06/17/2012   Seasonal allergies 01/22/2008   Osteoporosis 12/13/2006   OA (osteoarthritis) of knee 10/11/2006     PCP:  Jarold Motto   REFERRING PROVIDER: Jarold Motto   REFERRING DIAG: L heel pain   THERAPY DIAG:  Pain of left heel  Pain in left ankle and joints of left foot  Rationale for Evaluation and Treatment: Rehabilitation  ONSET DATE:    SUBJECTIVE:   SUBJECTIVE STATEMENT: Pt states heel pain better. Ball of foot pain better, and toe pain at night better, has not been waking her up. She states mild soreness in 5th digit on bottom of foot. Feels she is walking better.    Eval: Pt states pain in L heel pain, no previous pain here.  Has had pain in low back in the past, still present but improving. Also had TKA on L.  Pt states pain in L foot for about 3 months. Wearing casual sneakers today but has other , more cushioned sneakers at home, will bring next visit. She is wearing shoes around the house. States most Pain felt in heel in the morning. Also states tingling into all toes on R,  in evening , in the night and in morning. She is taking gabapentin, but reports unable to sleep more than 4 hours due to pain.  Likes to walk    PERTINENT HISTORY: L KTA, low back pain,   PAIN:  Are you having pain? Yes: NPRS scale: 0-3/10 Pain location: L foot  Pain description: sore Aggravating factors: first thing in AM,  increased walking  Relieving factors: none stated   PRECAUTIONS: None  WEIGHT BEARING RESTRICTIONS: No  FALLS:  Has patient fallen in last 6 months? No   PLOF: Independent  PATIENT GOALS:  Decreased pain in heel.   NEXT MD VISIT:   OBJECTIVE:   DIAGNOSTIC FINDINGS:   PATIENT SURVEYS:    COGNITION: Overall cognitive status: Within functional limits for tasks assessed     SENSATION: WFL  EDEMA:    POSTURE:   standing: L LE longer, L knee in slight flexion,  supine: L LE appears longer   PALPATION:  pain in base of L heel with deep palpation, soreness in posterior heel, none into mid achilles, tightness in gastroc, hypomobile DF.    LOWER EXTREMITY  ROM: Lumber: flex/ext: mod limitation Hips: flex: mild limitation,  ER: mod limitation bil Knees: L knee mild limitation for full extension, mild/mod for flexion  from TKE  Ankle: R: WFL,    L: mod limitation for DF.   LOWER EXTREMITY MMT:  MMT Left eval Right  eval  Hip flexion 4 4  Hip extension    Hip abduction    Hip adduction    Hip internal rotation    Hip external rotation    Knee flexion 5 5  Knee extension 5 5  Ankle dorsiflexion    Ankle plantarflexion    Ankle inversion    Ankle eversion     (Blank rows = not tested)  LOWER EXTREMITY SPECIAL TESTS:    GAIT: Distance walked: 100 ft Assistive device utilized: None Level of assistance: Complete Independence Comments: trunk flexion, short step length, low step height.    TODAY'S TREATMENT:                                                                                                                              DATE:   05/23/2023  Therapeutic Exercise: Aerobic: Supine:  Ankle pf/df with toe flex/ext x 15 on L for toe spreading and pl fascia stretch;  ankle Inv/Ev x 15;  Seated: Standing:  HR x 12 with education on form;  Stretches:  seated hamstring stretch 30 sec x 3;  Manual Therapy:   STM/TPR to L pl fascia , L lateral foot, bottom and top of foot;  A/P ankle mobs for DF ; Knee mobs to inc Extension Therapeutic Activity: NMR:  tandem stance 30 sec x 3 bil;  SLS 15-20 sec x 3 ea bil; with rest in between. Ambulation with cueing for heel to to pattern on L.  45 ft x 8; Self Care: Gait:       Therapeutic Exercise: Aerobic: Supine:  Ankle pf/df with toe flex/ext x 15 on L for toe spreading and pl fascia stretch;  Seated: Standing: Stretches:  gastroc stretch at wall x 3,   x 2  with towel roll under big toe for pl fascia stretch;  Neuromuscular Re-education: Manual Therapy:   STM/TPR to ball of foot   A/P ankle mobs for DF ; Knee mobs to inc Extension Therapeutic Activity: Self Care: Gait:   pregait: standing posture- education for equal weight bearing through both feet with L/R weight shfits.  Staggered stance weight shifts x 10 bil;  step throughs with L x 15;   Ambulation with cueing for heel to to pattern on L.  45 ft x 8;    Ther ex: See below for HEP Manual: Manual stretch for gastroc, IASTM for gastroc   PATIENT EDUCATION:  Education details: updated and reviewed HEP Person educated: Patient Education method: Explanation, Demonstration, Tactile cues, Verbal cues, and Handouts Education comprehension: verbalized understanding, returned demonstration, verbal cues required, tactile cues required, and needs further education   HOME EXERCISE PROGRAM: Access Code: [email or web address] URL: [email or web address] Date: 05/10/2023 Prepared by: Sedalia Muta  Exercises - Gastroc Stretch on Wall  - 2 x daily - 3 reps - 30 hold - Supine Active Ankle Pumps  - 2 x daily - 1 sets - 10 reps   ASSESSMENT:  CLINICAL IMPRESSION: 05/23/2023 Pt with good tolerance for activities today. She has improving pain overall. Much less pain in heel and ball of foot today, slight pain in lateral/5th region. She has much improved gait pattern with heel to toe pattern. Added balance, tandem and SLS today, pt with good ability. Plan to continue light strength and stability as able, and continue manual as needed for pain.   Eval: Patient presents with primary complaint of  pain in L heel. She has tightness in L gastroc as well as limited DF ROM on L side. She has increased pain in bottom and back of heel. She has leg length longer on L vs R, with L knee in slight flexion in standing. This could be contributing to pain as well. L LE does appear longer, and L knee also with slightly Less TKE than R. Pt with decreased ability for full functional activities due to pain in heel. Pt will  benefit from skilled PT to improve deficits and pain and to return to PLOF.   OBJECTIVE IMPAIRMENTS: Abnormal gait,  decreased activity tolerance, decreased ROM, hypomobility, impaired flexibility, improper body mechanics, and pain.   ACTIVITY LIMITATIONS: standing, sleeping, and locomotion level  PARTICIPATION LIMITATIONS: meal prep, cleaning, driving, shopping, and community activity  PERSONAL FACTORS: 1 comorbidity: posture/leg length  are also affecting patient's functional outcome.   REHAB POTENTIAL: Good  CLINICAL DECISION MAKING: Stable/uncomplicated  EVALUATION COMPLEXITY: Low   GOALS: Goals reviewed with patient? Yes  SHORT TERM GOALS: Target date: 05/24/2023  Pt to be independent with initial HEP  Goal status: INITIAL  2.  Pt to report decreased pain in L heel to 0-3/10 in AM.   Goal status: INITIAL    LONG TERM GOALS: Target date: 07/05/2023   Pt to be independent with final HEP  Goal status: INITIAL  2.  Pt to report decreased pain to 0-2/10 in am and with activity.   Goal status: INITIAL  3.  Pt to demo improved L ankle DF by at least 4 degrees.   Goal status: INITIAL  4.  Pt to demo optimal gait mechanics for pt age, with longer stride length.   Goal status: INITIAL  5.  Pt to be compliant with footwear and/or orthotic/heel lift as appropriate for foot type and leg length.   Goal status: INITIAL  PLAN:  PT FREQUENCY: 1-2x/week  PT DURATION: 8 weeks  PLANNED INTERVENTIONS: Therapeutic exercises, Therapeutic activity, Neuromuscular re-education, Patient/Family education, Self Care, Joint mobilization, Joint manipulation, Stair training, Orthotic/Fit training, DME instructions, Aquatic Therapy, Dry Needling, Electrical stimulation, Cryotherapy, Moist heat, Taping, Ultrasound, Ionotophoresis 4mg /ml Dexamethasone, Manual therapy,  Vasopneumatic device, Traction, Spinal manipulation, Spinal mobilization,Balance training, Gait training,   PLAN FOR NEXT SESSION: ankle DF, gastroc and pl fascia tightness, possible heel lift on R and/or met pad on L if needed,  Continue heel to toe pattern,  HS stretch if needed.    Sedalia Muta, PT, DPT 1:04 PM  05/23/23

## 2023-05-29 ENCOUNTER — Encounter: Payer: Self-pay | Admitting: Physical Therapy

## 2023-05-29 ENCOUNTER — Ambulatory Visit (INDEPENDENT_AMBULATORY_CARE_PROVIDER_SITE_OTHER): Admitting: Physical Therapy

## 2023-05-29 DIAGNOSIS — M79672 Pain in left foot: Secondary | ICD-10-CM | POA: Diagnosis not present

## 2023-05-29 DIAGNOSIS — M25572 Pain in left ankle and joints of left foot: Secondary | ICD-10-CM

## 2023-05-29 NOTE — Therapy (Signed)
 OUTPATIENT PHYSICAL THERAPY LOWER EXTREMITY TREATMENT   Patient Name: Cassidy Bennett MRN: [id number] DOB:April 30, 1940, 83 y.o., female Today's Date: 05/29/2023  END OF SESSION:  PT End of Session - 05/29/23 1209     Visit Number 4    Number of Visits 16    Date for PT Re-Evaluation 07/05/23    Authorization Type Medicare    PT Start Time 1018    PT Stop Time 1058    PT Time Calculation (min) 40 min    Activity Tolerance Patient tolerated treatment well    Behavior During Therapy WFL for tasks assessed/performed               Past Medical History:  Diagnosis Date   Allergy    Complication of anesthesia    History of TB (tuberculosis)    treated 45 plus years ago last cxr 03-15-18  CXR every 5 years   Hypercholesteremia    denies at preop   Hypertension    Osteoarthritis    Osteoporosis    PONV (postoperative nausea and vomiting)    Post-menopausal    Tuberculosis    Past Surgical History:  Procedure Laterality Date   CATARACT EXTRACTION  2018   right eye   COLONOSCOPY  2015   DILATION AND CURETTAGE OF UTERUS  2000   KNEE ARTHROSCOPY  2014   left   LUMBAR DISC SURGERY     SHOULDER SURGERY     right   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   Procedure: TOTAL KNEE ARTHROPLASTY;  Surgeon: Ollen Gross, MD;  Location: [facility];  Service: Orthopedics;  Laterality: Left;   Patient Active Problem List   Diagnosis Date Noted   Trigger thumb, right thumb 02/14/2023   Osteoarthritis of left knee 08/12/2018   Closed displaced fracture of proximal phalanx of right great toe 01/03/2017   Rotator cuff syndrome of right shoulder 01/03/2017   Hyperlipidemia 09/05/2016   Essential hypertension 08/27/2015   Urine abnormality 03/29/2015   Routine general medical examination at a health care facility 06/26/2013   History of TB (tuberculosis) 06/17/2012   Seasonal allergies 01/22/2008   Osteoporosis 12/13/2006   OA (osteoarthritis) of knee 10/11/2006     PCP:  Jarold Motto   REFERRING PROVIDER: Jarold Motto   REFERRING DIAG: L heel pain   THERAPY DIAG:  Pain of left heel  Pain in left ankle and joints of left foot  Rationale for Evaluation and Treatment: Rehabilitation  ONSET DATE:    SUBJECTIVE:   SUBJECTIVE STATEMENT: Pt states heel pain better. Still doing better.  Does have soreness in ball of foot, and mtp's (on bottom of foot) with palpation and at night time, states minimal pain during the day with activity.   Eval: Pt states pain in L heel pain, no previous pain here.  Has had pain in low back in the past, still present but improving. Also had TKA on L.  Pt states pain in L foot for about 3 months. Wearing casual sneakers today but has other , more cushioned sneakers at home, will bring next visit. She is wearing shoes around the house. States most Pain felt in heel in the morning. Also states tingling into all toes on R,  in evening , in the night and in morning. She is taking gabapentin, but reports unable to sleep more than 4 hours due to pain.  Likes to walk    PERTINENT HISTORY: L KTA, low back pain,   PAIN:  Are you  having pain? Yes: NPRS scale: 0-3/10 Pain location: L foot  Pain description: sore Aggravating factors: first thing in AM,  increased walking  Relieving factors: none stated   PRECAUTIONS: None  WEIGHT BEARING RESTRICTIONS: No  FALLS:  Has patient fallen in last 6 months? No   PLOF: Independent  PATIENT GOALS:  Decreased pain in heel.   NEXT MD VISIT:   OBJECTIVE:   DIAGNOSTIC FINDINGS:   PATIENT SURVEYS:    COGNITION: Overall cognitive status: Within functional limits for tasks assessed     SENSATION: WFL  EDEMA:    POSTURE:   standing: L LE longer, L knee in slight flexion,  supine: L LE appears longer   PALPATION:  pain in base of L heel with deep palpation, soreness in posterior heel, none into mid achilles, tightness in gastroc, hypomobile DF.    LOWER EXTREMITY  ROM: Lumber: flex/ext: mod limitation Hips: flex: mild limitation,  ER: mod limitation bil Knees: L knee mild limitation for full extension, mild/mod for flexion  from TKE  Ankle: R: WFL,    L: mod limitation for DF.   LOWER EXTREMITY MMT:  MMT Left eval Right  eval  Hip flexion 4 4  Hip extension    Hip abduction    Hip adduction    Hip internal rotation    Hip external rotation    Knee flexion 5 5  Knee extension 5 5  Ankle dorsiflexion    Ankle plantarflexion    Ankle inversion    Ankle eversion     (Blank rows = not tested)  LOWER EXTREMITY SPECIAL TESTS:    GAIT: Distance walked: 100 ft Assistive device utilized: None Level of assistance: Complete Independence Comments: trunk flexion, short step length, low step height.    TODAY'S TREATMENT:                                                                                                                              DATE:   05/29/2023 Therapeutic Exercise: Aerobic: Supine:  Ankle pf/df with toe flex/ext x 15 on L for toe spreading and pl fascia stretch;   Seated: Standing:  HR x 12 with education on form;  Stretches:  seated hamstring stretch 30 sec x 3; gastroc stretch off step 15 sec x 3 bil;  Manual Therapy:    A/P ankle mobs for DF ; Knee mobs to inc Extension Therapeutic Activity: NMR:  tandem stance 30 sec x 3 bil;  SLS 15-20 sec x 3 ea bil; Ambulation with cueing for heel to to pattern on L.  45 ft x 8; Trial for heel lift in R shoe, comfortable to wear, but does not seem to have impact on standing posture, L knee still in slight flexion- decision to not wear heel lift at this time.  Self Care: Gait   Therapeutic Exercise: Aerobic: Supine:  Ankle pf/df with toe flex/ext x 15 on L for toe spreading and pl fascia  stretch;  ankle Inv/Ev x 15;  Seated: Standing:  HR x 12 with education on form;  Stretches:  seated hamstring stretch 30 sec x 3;  Manual Therapy:   STM/TPR to L pl fascia , L lateral foot,  bottom and top of foot;  A/P ankle mobs for DF ; Knee mobs to inc Extension Therapeutic Activity: NMR:  tandem stance 30 sec x 3 bil;  SLS 15-20 sec x 3 ea bil; with rest in between. Ambulation with cueing for heel to to pattern on L.  45 ft x 8; Self Care: Gait:       Therapeutic Exercise: Aerobic: Supine:  Ankle pf/df with toe flex/ext x 15 on L for toe spreading and pl fascia stretch;  Seated: Standing: Stretches:  gastroc stretch at wall x 3,   x 2 with towel roll under big toe for pl fascia stretch;  Neuromuscular Re-education: Manual Therapy:   STM/TPR to ball of foot   A/P ankle mobs for DF ; Knee mobs to inc Extension Therapeutic Activity: Self Care: Gait:  pregait: standing posture- education for equal weight bearing through both feet with L/R weight shfits.  Staggered stance weight shifts x 10 bil;  step throughs with L x 15;   Ambulation with cueing for heel to to pattern on L.  45 ft x 8;    Ther ex: See below for HEP Manual: Manual stretch for gastroc, IASTM for gastroc   PATIENT EDUCATION:  Education details: updated and reviewed HEP Person educated: Patient Education method: Explanation, Demonstration, Tactile cues, Verbal cues, and Handouts Education comprehension: verbalized understanding, returned demonstration, verbal cues required, tactile cues required, and needs further education   HOME EXERCISE PROGRAM: Access Code: [email or web address] URL: [email or web address] Date: 05/10/2023 Prepared by: Sedalia Muta  Exercises - Gastroc Stretch on Wall  - 2 x daily - 3 reps - 30 hold - Supine Active Ankle Pumps  - 2 x daily - 1 sets - 10 reps   ASSESSMENT:  CLINICAL IMPRESSION: 05/29/2023 Pt with good tolerance for activities today. She has soreness in base of mtps with palpation. After trial for heel lift we decided she does not need it at this time. Cueing for longer stride length for more heel strike, pt with good ability with cueing but noted shorter  stride length with ambulation into clinic today. Discussed continued mobility for foot and toes during the day and in evening before bed.   Eval: Patient presents with primary complaint of  pain in L heel. She has tightness in L gastroc as well as limited DF ROM on L side. She has increased pain in bottom and back of heel. She has leg length longer on L vs R, with L knee in slight flexion in standing. This could be contributing to pain as well. L LE does appear longer, and L knee also with slightly Less TKE than R. Pt with decreased ability for full functional activities due to pain in heel. Pt will  benefit from skilled PT to improve deficits and pain and to return to PLOF.   OBJECTIVE IMPAIRMENTS: Abnormal gait, decreased activity tolerance, decreased ROM, hypomobility, impaired flexibility, improper body mechanics, and pain.   ACTIVITY LIMITATIONS: standing, sleeping, and locomotion level  PARTICIPATION LIMITATIONS: meal prep, cleaning, driving, shopping, and community activity  PERSONAL FACTORS: 1 comorbidity: posture/leg length  are also affecting patient's functional outcome.   REHAB POTENTIAL: Good  CLINICAL DECISION MAKING: Stable/uncomplicated  EVALUATION COMPLEXITY: Low   GOALS: Goals reviewed with patient? Yes  SHORT TERM GOALS: Target date: 05/24/2023  Pt to be independent with initial HEP  Goal status: INITIAL  2.  Pt to report decreased pain in L heel to 0-3/10 in AM.   Goal status: INITIAL    LONG TERM GOALS: Target date: 07/05/2023   Pt to be independent with final HEP  Goal status: INITIAL  2.  Pt to report decreased pain to 0-2/10 in am and with activity.   Goal status: INITIAL  3.  Pt to demo improved L ankle DF by at least 4 degrees.   Goal status: INITIAL  4.  Pt to demo optimal gait mechanics for pt age, with longer stride length.   Goal status: INITIAL  5.  Pt to be compliant with footwear and/or orthotic/heel lift as appropriate for foot type  and leg length.   Goal status: INITIAL   PLAN:  PT FREQUENCY: 1-2x/week  PT DURATION: 8 weeks  PLANNED INTERVENTIONS: Therapeutic exercises, Therapeutic activity, Neuromuscular re-education, Patient/Family education, Self Care, Joint mobilization, Joint manipulation, Stair training, Orthotic/Fit training, DME instructions, Aquatic Therapy, Dry Needling, Electrical stimulation, Cryotherapy, Moist heat, Taping, Ultrasound, Ionotophoresis 4mg /ml Dexamethasone, Manual therapy,  Vasopneumatic device, Traction, Spinal manipulation, Spinal mobilization,Balance training, Gait training,   PLAN FOR NEXT SESSION: ankle DF, gastroc and pl fascia tightness, possible heel lift on R and/or met pad on L if needed, Continue heel to toe pattern,  HS stretch if needed.    Sedalia Muta, PT, DPT 12:09 PM  05/29/23

## 2023-05-31 ENCOUNTER — Encounter: Admitting: Physical Therapy

## 2023-06-01 ENCOUNTER — Telehealth: Payer: Self-pay

## 2023-06-01 NOTE — Telephone Encounter (Signed)
 [facility] VOB initiated via [email or web address]   GIVEN ON 05/10/23

## 2023-06-05 ENCOUNTER — Encounter: Admitting: Physical Therapy

## 2023-06-06 NOTE — Progress Notes (Unsigned)
  Cardiology Office Note:  .   Date:  06/06/2023  ID:  Cassidy Bennett, DOB 1940/05/25, MRN [id number] PCP: Jarold Motto, PA  [facility] Providers Cardiologist:  None { Click to update primary MD,subspecialty MD or APP then REFRESH:1}   History of Present Illness: .   No chief complaint on file.   Cassidy Bennett is a 83 y.o. female with history of murmur, HTN, HLD who presents for follow-up.   T chol 227, HDL 55, LDL 141, TG 154     Problem List Murmur -2/2 hyperdynamic LV 2. Carotid artery disease -1-39% bilaterally  3. HTN    ROS: All other ROS reviewed and negative. Pertinent positives noted in the HPI.     Studies Reviewed: Marland Kitchen       TTE 03/09/2022  1. There is a dynamic gradient in the mid LV cavity due to hyperdynamic  LVF with a peak gradient of .Marland Kitchen Left ventricular ejection fraction,  by estimation, is 70 to 75%. The left ventricle has hyperdynamic function.  The left ventricle has no  regional wall motion abnormalities. There is moderate left ventricular  hypertrophy of the basal-septal segment. Left ventricular diastolic  parameters are consistent with Grade I diastolic dysfunction (impaired  relaxation).   2. Right ventricular systolic function is normal. The right ventricular  size is normal. There is normal pulmonary artery systolic pressure. The  estimated right ventricular systolic pressure is 27.8 mmHg.   3. The mitral valve is degenerative. Mild mitral valve regurgitation. No  evidence of mitral stenosis.   4. The aortic valve is tricuspid. There is moderate calcification of the  aortic valve. There is moderate thickening of the aortic valve. Aortic  valve regurgitation is trivial. Aortic valve sclerosis/calcification is  present, without any evidence of  aortic stenosis. Aortic valve mean gradient measures 9.0 mmHg. Aortic  valve Vmax measures 1.90 m/s.   5. The inferior vena cava is normal in size with greater than 50%   respiratory variability, suggesting right atrial pressure of 3 mmHg.  Physical Exam:   VS:  There were no vitals taken for this visit.   Wt Readings from Last 3 Encounters:  04/09/23 109 lb (49.4 kg)  02/14/23 110 lb (49.9 kg)  01/30/23 108 lb 8 oz (49.2 kg)    GEN: Well nourished, well developed in no acute distress NECK: No JVD; No carotid bruits CARDIAC: ***RRR, no murmurs, rubs, gallops RESPIRATORY:  Clear to auscultation without rales, wheezing or rhonchi  ABDOMEN: Soft, non-tender, non-distended EXTREMITIES:  No edema; No deformity  ASSESSMENT AND PLAN: .   ***    {Are you ordering a CV Procedure (e.g. stress test, cath, DCCV, TEE, etc)?   Press F2        :[id number]}   Follow-up: No follow-ups on file.  Time Spent with Patient: I have spent a total of *** minutes caring for this patient today face to face, ordering and reviewing labs/tests, reviewing prior records/medical history, examining the patient, establishing an assessment and plan, communicating results/findings to the patient/family, and documenting in the medical record.   Signed, Lenna Gilford. Flora Lipps, MD, [facility]  [facility]  [address] [place], [place] [address] [phone]  7:01 PM

## 2023-06-07 ENCOUNTER — Ambulatory Visit: Payer: Medicare Other | Attending: Cardiovascular Disease | Admitting: Cardiovascular Disease

## 2023-06-07 ENCOUNTER — Encounter: Payer: Self-pay | Admitting: Cardiovascular Disease

## 2023-06-07 ENCOUNTER — Encounter: Payer: Self-pay | Admitting: Physical Therapy

## 2023-06-07 ENCOUNTER — Ambulatory Visit (INDEPENDENT_AMBULATORY_CARE_PROVIDER_SITE_OTHER): Admitting: Physical Therapy

## 2023-06-07 VITALS — BP 138/62 | HR 84 | Ht 62.0 in | Wt 110.0 lb

## 2023-06-07 DIAGNOSIS — R011 Cardiac murmur, unspecified: Secondary | ICD-10-CM | POA: Diagnosis not present

## 2023-06-07 DIAGNOSIS — M25572 Pain in left ankle and joints of left foot: Secondary | ICD-10-CM | POA: Diagnosis not present

## 2023-06-07 DIAGNOSIS — I10 Essential (primary) hypertension: Secondary | ICD-10-CM | POA: Diagnosis not present

## 2023-06-07 DIAGNOSIS — M79672 Pain in left foot: Secondary | ICD-10-CM | POA: Diagnosis not present

## 2023-06-07 NOTE — Patient Instructions (Signed)
 Medication Instructions:  Your physician recommends that you continue on your current medications as directed. Please refer to the Current Medication list given to you today.    *If you need a refill on your cardiac medications before your next appointment, please call your pharmacy*   Lab Work: None    If you have labs (blood work) drawn today and your tests are completely normal, you will receive your results only by: MyChart Message (if you have MyChart) OR A paper copy in the mail If you have any lab test that is abnormal or we need to change your treatment, we will call you to review the results.   Testing/Procedures: None    Follow-Up: At [facility], you and your health needs are our priority.  As part of our continuing mission to provide you with exceptional heart care, we have created designated Provider Care Teams.  These Care Teams include your primary Cardiologist (physician) and Advanced Practice Providers (APPs -  Physician Assistants and Nurse Practitioners) who all work together to provide you with the care you need, when you need it.  We recommend signing up for the patient portal called "MyChart".  Sign up information is provided on this After Visit Summary.  MyChart is used to connect with patients for Virtual Visits (Telemedicine).  Patients are able to view lab/test results, encounter notes, upcoming appointments, etc.  Non-urgent messages can be sent to your provider as well.   To learn more about what you can do with MyChart, go to [email or web address].    Your next appointment:   Call or return to clinic prn if these symptoms worsen or fail to improve as anticipated.   Provider:   Lennie Odor, MD     Other Instructions

## 2023-06-07 NOTE — Therapy (Addendum)
 " OUTPATIENT PHYSICAL THERAPY LOWER EXTREMITY TREATMENT   Patient Name: Cassidy Bennett MRN: [id number] DOB:[birth year], 83 y.o., female Today's Date: 06/07/2023  END OF SESSION:  PT End of Session - 06/07/23 1513     Visit Number 5    Number of Visits 16    Date for PT Re-Evaluation 07/05/23    Authorization Type Medicare    PT Start Time 1515    PT Stop Time 1555    PT Time Calculation (min) 40 min    Activity Tolerance Patient tolerated treatment well    Behavior During Therapy [facility] for tasks assessed/performed               Past Medical History:  Diagnosis Date   Allergy    Complication of anesthesia    History of TB (tuberculosis)    treated 45 plus years ago last cxr 03-15-18  CXR every 5 years   Hypercholesteremia    denies at preop   Hypertension    Osteoarthritis    Osteoporosis    PONV (postoperative nausea and vomiting)    Post-menopausal    Tuberculosis    Past Surgical History:  Procedure Laterality Date   CATARACT EXTRACTION  2018   right eye   COLONOSCOPY  2015   DILATION AND CURETTAGE OF UTERUS  2000   KNEE ARTHROSCOPY  2014   left   LUMBAR DISC SURGERY     SHOULDER SURGERY     right   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   Procedure: TOTAL KNEE ARTHROPLASTY;  Surgeon: Melodi Lerner, MD;  Location: [facility];  Service: Orthopedics;  Laterality: Left;   Patient Active Problem List   Diagnosis Date Noted   Trigger thumb, right thumb 02/14/2023   Osteoarthritis of left knee 08/12/2018   Closed displaced fracture of proximal phalanx of right great toe 01/03/2017   Rotator cuff syndrome of right shoulder 01/03/2017   Hyperlipidemia 09/05/2016   Essential hypertension 08/27/2015   Urine abnormality 03/29/2015   Routine general medical examination at a health care facility 06/26/2013   History of TB (tuberculosis) 06/17/2012   Seasonal allergies 01/22/2008   Osteoporosis 12/13/2006   OA (osteoarthritis) of knee 10/11/2006     PCP:  Lucie Buttner   REFERRING PROVIDER: Lucie Buttner   REFERRING DIAG: L heel pain   THERAPY DIAG:  Pain of left heel  Pain in left ankle and joints of left foot  Rationale for Evaluation and Treatment: Rehabilitation  ONSET DATE:    SUBJECTIVE:   SUBJECTIVE STATEMENT: Pt states heel pain better. Still doing better.  Does have soreness in ball of foot, and mtp's (on bottom of foot) with palpation and at night time, states minimal pain during the day with activity.   Eval: Pt states pain in L heel pain, no previous pain here.  Has had pain in low back in the past, still present but improving. Also had TKA on L.  Pt states pain in L foot for about 3 months. Wearing casual sneakers today but has other , more cushioned sneakers at home, will bring next visit. She is wearing shoes around the house. States most Pain felt in heel in the morning. Also states tingling into all toes on R,  in evening , in the night and in morning. She is taking gabapentin , but reports unable to sleep more than 4 hours due to pain.  Likes to walk    PERTINENT HISTORY: L KTA, low back pain,   PAIN:  Are  you having pain? Yes: NPRS scale: 0-3/10 Pain location: L foot  Pain description: sore Aggravating factors: first thing in AM,  increased walking  Relieving factors: none stated   PRECAUTIONS: None  WEIGHT BEARING RESTRICTIONS: No  FALLS:  Has patient fallen in last 6 months? No   PLOF: Independent  PATIENT GOALS:  Decreased pain in heel.   NEXT MD VISIT:   OBJECTIVE:   DIAGNOSTIC FINDINGS:   PATIENT SURVEYS:    COGNITION: Overall cognitive status: Within functional limits for tasks assessed     SENSATION: WFL  EDEMA:    POSTURE:   standing: L LE longer, L knee in slight flexion,  supine: L LE appears longer   PALPATION:  pain in base of L heel with deep palpation, soreness in posterior heel, none into mid achilles, tightness in gastroc, hypomobile DF.    LOWER EXTREMITY  ROM: Lumber: flex/ext: mod limitation Hips: flex: mild limitation,  ER: mod limitation bil Knees: L knee mild limitation for full extension, mild/mod for flexion  from TKE  Ankle: R: WFL,    L: mod limitation for DF.   LOWER EXTREMITY MMT:  MMT Left eval Right  eval  Hip flexion 4 4  Hip extension    Hip abduction    Hip adduction    Hip internal rotation    Hip external rotation    Knee flexion 5 5  Knee extension 5 5  Ankle dorsiflexion    Ankle plantarflexion    Ankle inversion    Ankle eversion     (Blank rows = not tested)  LOWER EXTREMITY SPECIAL TESTS:    GAIT: Distance walked: 100 ft Assistive device utilized: None Level of assistance: Complete Independence Comments: trunk flexion, short step length, low step height.    TODAY'S TREATMENT:                                                                                                                              DATE:   06/07/2023 Therapeutic Exercise: Aerobic: Supine:  Ankle pf/df with toe flex/ext x 15 on L for toe spreading and pl fascia stretch;   Seated: Standing:    Stretches:  Manual Therapy:    A/P ankle mobs for DF ; Knee mobs to inc Extension Therapeutic Activity: NMR:  tandem stance 30 sec x 3 bil with head turns L/R;  SLS 15-20 sec x 3 ea bil; Ambulation with cueing for heel to to pattern on L.  45 ft x 6;  Self Care: Gait   Therapeutic Exercise: Aerobic: Supine:  Ankle pf/df with toe flex/ext x 15 on L for toe spreading and pl fascia stretch;  ankle Inv/Ev x 15;  Seated: Standing:  HR x 12 with education on form;  Stretches:  seated hamstring stretch 30 sec x 3;  Manual Therapy:   STM/TPR to L pl fascia , L lateral foot, bottom and top of foot;  A/P ankle mobs for DF ;  Knee mobs to inc Extension Therapeutic Activity: NMR:  tandem stance 30 sec x 3 bil;  SLS 15-20 sec x 3 ea bil; with rest in between. Ambulation with cueing for heel to to pattern on L.  45 ft x 8; Self Care: Gait:        Therapeutic Exercise: Aerobic: Supine:  Ankle pf/df with toe flex/ext x 15 on L for toe spreading and pl fascia stretch;  Seated: Standing: Stretches:  gastroc stretch at wall x 3,   x 2 with towel roll under big toe for pl fascia stretch;  Neuromuscular Re-education: Manual Therapy:   STM/TPR to ball of foot   A/P ankle mobs for DF ; Knee mobs to inc Extension Therapeutic Activity: Self Care: Gait:  pregait: standing posture- education for equal weight bearing through both feet with L/R weight shfits.  Staggered stance weight shifts x 10 bil;  step throughs with L x 15;   Ambulation with cueing for heel to to pattern on L.  45 ft x 8;    Ther ex: See below for HEP Manual: Manual stretch for gastroc, IASTM for gastroc   PATIENT EDUCATION:  Education details: updated and reviewed HEP Person educated: Patient Education method: Explanation, Demonstration, Tactile cues, Verbal cues, and Handouts Education comprehension: verbalized understanding, returned demonstration, verbal cues required, tactile cues required, and needs further education   HOME EXERCISE PROGRAM: Access Code: [email or web address] URL: [email or web address] Date: 05/10/2023 Prepared by: Tinnie Don  Exercises - Gastroc Stretch on Wall  - 2 x daily - 3 reps - 30 hold - Supine Active Ankle Pumps  - 2 x daily - 1 sets - 10 reps   ASSESSMENT:  CLINICAL IMPRESSION: 06/07/2023 Pt with good tolerance for activities today. She is progressing well with pain. She has no pain to palpate or with activities today or at home in last week or so. Only remaining symptom is tingling into toes in the night. We discussed trial for sleeping position change. She is doing very well with HEP, and has much improved gait pattern as well. Pt will return for 1 more visit in 2 weeks, possible d/c at that time. Discussed f/u with PCP for tingling in toes if it persists, but it is improving at this time.   Eval: Patient presents  with primary complaint of  pain in L heel. She has tightness in L gastroc as well as limited DF ROM on L side. She has increased pain in bottom and back of heel. She has leg length longer on L vs R, with L knee in slight flexion in standing. This could be contributing to pain as well. L LE does appear longer, and L knee also with slightly Less TKE than R. Pt with decreased ability for full functional activities due to pain in heel. Pt will  benefit from skilled PT to improve deficits and pain and to return to PLOF.   OBJECTIVE IMPAIRMENTS: Abnormal gait, decreased activity tolerance, decreased ROM, hypomobility, impaired flexibility, improper body mechanics, and pain.   ACTIVITY LIMITATIONS: standing, sleeping, and locomotion level  PARTICIPATION LIMITATIONS: meal prep, cleaning, driving, shopping, and community activity  PERSONAL FACTORS: 1 comorbidity: posture/leg length are also affecting patient's functional outcome.   REHAB POTENTIAL: Good  CLINICAL DECISION MAKING: Stable/uncomplicated  EVALUATION COMPLEXITY: Low   GOALS: Goals reviewed with patient? Yes  SHORT TERM GOALS: Target date: 05/24/2023  Pt to be independent with initial HEP  Goal status: MET  2.  Pt to report decreased pain in L  heel to 0-3/10 in AM.   Goal status: MET    LONG TERM GOALS: Target date: 07/05/2023   Pt to be independent with final HEP  Goal status: MET  2.  Pt to report decreased pain to 0-2/10 in am and with activity.   Goal status: MET  3.  Pt to demo improved L ankle DF by at least 4 degrees.   Goal status: INITIAL  4.  Pt to demo optimal gait mechanics for pt age, with longer stride length.   Goal status: MET  5.  Pt to be compliant with footwear and/or orthotic/heel lift as appropriate for foot type and leg length.   Goal status: MET   PLAN:  PT FREQUENCY: 1-2x/week  PT DURATION: 8 weeks  PLANNED INTERVENTIONS: Therapeutic exercises, Therapeutic activity, Neuromuscular  re-education, Patient/Family education, Self Care, Joint mobilization, Joint manipulation, Stair training, Orthotic/Fit training, DME instructions, Aquatic Therapy, Dry Needling, Electrical stimulation, Cryotherapy, Moist heat, Taping, Ultrasound, Ionotophoresis 4mg /ml Dexamethasone , Manual therapy,  Vasopneumatic device, Traction, Spinal manipulation, Spinal mobilization,Balance training, Gait training,   PLAN FOR NEXT SESSION: ankle DF, gastroc and pl fascia tightness, possible heel lift on R and/or met pad on L if needed, Continue heel to toe pattern,  HS stretch if needed.    Tinnie Don, PT, DPT 3:14 PM  06/07/23  PHYSICAL THERAPY DISCHARGE SUMMARY  Visits from Start of Care: 5   Plan: Patient agrees to discharge.  Patient goals were partially met. Patient is being discharged due to- pt did not return after last visit.     Tinnie Don, PT, DPT 10:48 AM  03/25/24    "

## 2023-06-08 NOTE — Telephone Encounter (Signed)
 Marland Kitchen

## 2023-06-08 NOTE — Telephone Encounter (Signed)
 Pt ready for scheduling for PROLIA on or after : 06/08/23  Option# 1: Buy/Bill (Office supplied medication)  Out-of-pocket cost due at time of clinic visit: $0  Number of injection/visits approved: ---  Primary: MEDICARE Prolia co-insurance: 0% Admin fee co-insurance: 0%  Secondary: [employer] Prolia co-insurance:  Admin fee co-insurance:   Medical Benefit Details: Date Benefits were checked: 06/07/23 Deductible: $257 MET OF $257 REQUIRED/ Coinsurance: 0%/ Admin Fee: 0%  Prior Auth: N/A PA# Expiration Date:   # of doses approved: ----------------------------------------------------------------------- Option# 2- Med Obtained from pharmacy:  Pharmacy benefit: Copay $--- (Paid to pharmacy) Admin Fee: --- (Pay at clinic)  Prior Auth: N/A PA# Expiration Date:   # of doses approved:   If patient wants fill through the pharmacy benefit please send prescription to:  --- , and include estimated need by date in rx notes. Pharmacy will ship medication directly to the office.  Patient NOT eligible for Prolia Copay Card. Copay Card can make patient's cost as little as $25. Link to apply: [email or web address]://[email or web address]  ** This summary of benefits is an estimation of the patient's out-of-pocket cost. Exact cost may very based on individual plan coverage.

## 2023-06-13 ENCOUNTER — Encounter: Admitting: Physical Therapy

## 2023-06-18 ENCOUNTER — Encounter: Admitting: Physical Therapy

## 2023-06-26 DIAGNOSIS — H26493 Other secondary cataract, bilateral: Secondary | ICD-10-CM | POA: Diagnosis not present

## 2023-06-26 DIAGNOSIS — H10413 Chronic giant papillary conjunctivitis, bilateral: Secondary | ICD-10-CM | POA: Diagnosis not present

## 2023-06-26 DIAGNOSIS — Z961 Presence of intraocular lens: Secondary | ICD-10-CM | POA: Diagnosis not present

## 2023-07-28 DIAGNOSIS — Z23 Encounter for immunization: Secondary | ICD-10-CM | POA: Diagnosis not present

## 2023-08-01 ENCOUNTER — Ambulatory Visit: Payer: Medicare Other | Admitting: Physician Assistant

## 2023-08-06 ENCOUNTER — Ambulatory Visit (INDEPENDENT_AMBULATORY_CARE_PROVIDER_SITE_OTHER): Admitting: Physician Assistant

## 2023-08-06 ENCOUNTER — Encounter: Payer: Self-pay | Admitting: Physician Assistant

## 2023-08-06 VITALS — BP 130/70 | HR 80 | Temp 98.1°F | Ht 62.0 in | Wt 109.5 lb

## 2023-08-06 DIAGNOSIS — M810 Age-related osteoporosis without current pathological fracture: Secondary | ICD-10-CM | POA: Diagnosis not present

## 2023-08-06 DIAGNOSIS — E559 Vitamin D deficiency, unspecified: Secondary | ICD-10-CM | POA: Diagnosis not present

## 2023-08-06 DIAGNOSIS — R7309 Other abnormal glucose: Secondary | ICD-10-CM | POA: Diagnosis not present

## 2023-08-06 DIAGNOSIS — I10 Essential (primary) hypertension: Secondary | ICD-10-CM

## 2023-08-06 DIAGNOSIS — R682 Dry mouth, unspecified: Secondary | ICD-10-CM | POA: Diagnosis not present

## 2023-08-06 LAB — CBC WITH DIFFERENTIAL/PLATELET
Basophils Absolute: 0 10*3/uL (ref 0.0–0.1)
Basophils Relative: 0.7 % (ref 0.0–3.0)
Eosinophils Absolute: 0 10*3/uL (ref 0.0–0.7)
Eosinophils Relative: 0.8 % (ref 0.0–5.0)
HCT: 39.6 % (ref 36.0–46.0)
Hemoglobin: 13.3 g/dL (ref 12.0–15.0)
Lymphocytes Relative: 25.4 % (ref 12.0–46.0)
Lymphs Abs: 1.6 10*3/uL (ref 0.7–4.0)
MCHC: 33.7 g/dL (ref 30.0–36.0)
MCV: 79.2 fl (ref 78.0–100.0)
Monocytes Absolute: 0.5 10*3/uL (ref 0.1–1.0)
Monocytes Relative: 7.6 % (ref 3.0–12.0)
Neutro Abs: 4.1 10*3/uL (ref 1.4–7.7)
Neutrophils Relative %: 65.5 % (ref 43.0–77.0)
Platelets: 185 10*3/uL (ref 150.0–400.0)
RBC: 5 Mil/uL (ref 3.87–5.11)
RDW: 14.8 % (ref 11.5–15.5)
WBC: 6.2 10*3/uL (ref 4.0–10.5)

## 2023-08-06 LAB — COMPREHENSIVE METABOLIC PANEL WITH GFR
ALT: 20 U/L (ref 0–35)
AST: 25 U/L (ref 0–37)
Albumin: 4.7 g/dL (ref 3.5–5.2)
Alkaline Phosphatase: 40 U/L (ref 39–117)
BUN: 17 mg/dL (ref 6–23)
CO2: 26 meq/L (ref 19–32)
Calcium: 9.5 mg/dL (ref 8.4–10.5)
Chloride: 102 meq/L (ref 96–112)
Creatinine, Ser: 0.59 mg/dL (ref 0.40–1.20)
GFR: 83.25 mL/min (ref >60.00)
Glucose, Bld: 81 mg/dL (ref 70–99)
Potassium: 4.4 meq/L (ref 3.5–5.1)
Sodium: 136 meq/L (ref 135–145)
Total Bilirubin: 0.6 mg/dL (ref 0.2–1.2)
Total Protein: 8.1 g/dL (ref 6.0–8.3)

## 2023-08-06 LAB — HEMOGLOBIN A1C: Hgb A1c MFr Bld: 6.1 % (ref 4.6–6.5)

## 2023-08-06 MED ORDER — AMLODIPINE BESYLATE 10 MG PO TABS: 10.0000 mg | ORAL_TABLET | Freq: Every day | ORAL | 1 refills | Status: DC

## 2023-08-06 NOTE — Progress Notes (Signed)
 Cassidy Bennett is a 83 y.o. female here for a follow up of a pre-existing problem.  History of Present Illness:   Chief Complaint  Patient presents with   Hypertension   Dry mouth    Pt c/o dry mouth and tongue x 1 month   Constipation    Pt c/o constipation x 1 month.    HPI - Pt is accompanied by her husband.  HTN Pt is on Amlodipine  10 mg daily. HTN is well-managed. No acute concerns reported today.  Dry mouth Pt complains of dry mouth starting 1 month ago.  She reports that her tongue is cracked and itchy ears. She states she has seen her dentist abt these sx. She also reports occasionally feeling shaky and tired when getting up at night, but denies that it is related to Benadryl . Endorses trying a toothpaste for dry mouth, Gabapentin  as needed, and Benadryl  1-2 x a month. Endorses proper hydration with lemon/honey water and cucumber water. Denies regularly taking allergy pills or trying a humidifier.  Constipation Pt complains of constipation starting 1 month ago.  Her husband confirms patient has had previous episodes of constipation. Pt states she has had a colonoscopy in the past. Pt states she has bowel movement every 3 days after taking laxatives. She reports taking Ex-lax often. Endorses taking Miralax  and Ex-lax and good exercise habits. Endorses black stool and intermittent night sweats. Denies blood when wiping or abdominal pain. Pt does not want to have a colonoscopy, but has had one in the past.  Past Medical History:  Diagnosis Date   Allergy    Complication of anesthesia    History of TB (tuberculosis)    treated 45 plus years ago last cxr 03-15-18  CXR every 5 years   Hypercholesteremia    denies at preop   Hypertension    Osteoarthritis    Osteoporosis    PONV (postoperative nausea and vomiting)    Post-menopausal    Tuberculosis      Social History   Tobacco Use   Smoking status: Never   Smokeless tobacco: Never  Vaping Use    Vaping status: Never Used  Substance Use Topics   Alcohol use: Not Currently    Alcohol/week: 5.0 standard drinks of alcohol    Types: 5 Glasses of wine per week   Drug use: No    Past Surgical History:  Procedure Laterality Date   CATARACT EXTRACTION  2018   right eye   COLONOSCOPY  2015   DILATION AND CURETTAGE OF UTERUS  2000   KNEE ARTHROSCOPY  2014   left   LUMBAR DISC SURGERY     SHOULDER SURGERY     right   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   Procedure: TOTAL KNEE ARTHROPLASTY;  Surgeon: Liliane Rei, MD;  Location: [facility];  Service: Orthopedics;  Laterality: Left;    Family History  Problem Relation Age of Onset   Other Mother        malaria   Other Father    Cancer Other        stomach   Breast cancer Neg Hx     Allergies  Allergen Reactions   Metoprolol  Nausea And Vomiting   Morphine  And Codeine Nausea And Vomiting   Other Nausea And Vomiting    Current Medications:   Current Outpatient Medications:    Ascorbic Acid (VITAMIN C PO), Take 1,000 mg by mouth daily. , Disp: , Rfl:    b complex vitamins tablet, Take  1 tablet by mouth daily. Every other day, Disp: , Rfl:    Calcium Carb-Cholecalciferol (CALCIUM 600 + D PO), Take 1 tablet by mouth daily., Disp: , Rfl:    cholecalciferol (VITAMIN D3) 25 MCG (1000 UNIT) tablet, Take 2,000 Units by mouth daily., Disp: , Rfl:    gabapentin  (NEURONTIN ) 100 MG capsule, Take 100 mg by mouth at bedtime., Disp: , Rfl:    magnesium 30 MG tablet, Take 30 mg by mouth 3 (three) times a week., Disp: , Rfl:    polyethylene glycol (MIRALAX  / GLYCOLAX ) packet, Take 17 g by mouth daily as needed for moderate constipation. , Disp: , Rfl:    Vitamin A 2400 MCG (8000 UT) TABS, Take 8,000 Units by mouth daily. , Disp: , Rfl:    vitamin B-12 (CYANOCOBALAMIN ) 1000 MCG tablet, Take 1,000 mcg by mouth daily., Disp: , Rfl:    vitamin E 400 UNIT capsule, Take 400 Units by mouth daily., Disp: , Rfl:    amLODipine  (NORVASC ) 10 MG  tablet, Take 1 tablet (10 mg total) by mouth daily., Disp: 90 tablet, Rfl: 1   Review of Systems:   Negative unless otherwise specified per HPI.  Vitals:   Vitals:   08/06/23 0931  BP: 130/70  Pulse: 80  Temp: 98.1 F (36.7 C)  TempSrc: Temporal  SpO2: 98%  Weight: 109 lb 8 oz (49.7 kg)  Height: 5\' 2"  (1.575 m)     Body mass index is 20.03 kg/m.  Physical Exam:   Physical Exam Vitals and nursing note reviewed.  Constitutional:      General: She is not in acute distress.    Appearance: She is well-developed. She is not ill-appearing or toxic-appearing.  Cardiovascular:     Rate and Rhythm: Normal rate and regular rhythm.     Pulses: Normal pulses.     Heart sounds: Normal heart sounds, S1 normal and S2 normal.  Pulmonary:     Effort: Pulmonary effort is normal.     Breath sounds: Normal breath sounds.  Skin:    General: Skin is warm and dry.  Neurological:     Mental Status: She is alert.     GCS: GCS eye subscore is 4. GCS verbal subscore is 5. GCS motor subscore is 6.  Psychiatric:        Speech: Speech normal.        Behavior: Behavior normal. Behavior is cooperative.     Assessment and Plan:   1. Dry mouth (Primary) - CBC with Differential/Platelet - TSH  Update blood work Likely due to advanced age, but I did offer autoimmune testing to further evaluate -- she declined Continue close follow up with dentistry  2. Vitamin D  deficiency - VITAMIN D  25 Hydroxy (Vit-D Deficiency, Fractures)  Update and provide recommendations  3. Osteoporosis without current pathological fracture, unspecified osteoporosis type  Continue Prolia  Update vitamin D  and renal function Up to date on DEXA  4. Elevated glucose - Hemoglobin A1c  5. Essential hypertension - Comprehensive metabolic panel with GFR - amLODipine  (NORVASC ) 10 MG tablet; Take 1 tablet (10 mg total) by mouth daily.  Dispense: 90 tablet; Refill: 1 - CBC with Differential/Platelet - TSH    Normotensive Continue amlodipine  10 mg daily Follow up in 6 month(s), sooner if concerns  I, Timoteo Force, acting as a Neurosurgeon for [employer], [place]., have documented all relevant documentation on the behalf of Alexander Iba, [place], as directed by  Alexander Iba, PA while in the presence of  Alexander Iba, PA.  I, Alexander Iba, [place], have reviewed all documentation for this visit. The documentation on 08/06/23 for the exam, diagnosis, procedures, and orders are all accurate and complete.  Alexander Iba, PA-C

## 2023-08-06 NOTE — Patient Instructions (Signed)
  Wt Readings from Last 4 Encounters:  08/06/23 109 lb 8 oz (49.7 kg)  06/07/23 110 lb (49.9 kg)  04/09/23 109 lb (49.4 kg)  02/14/23 110 lb (49.9 kg)

## 2023-08-09 LAB — TSH: TSH: 2.98 u[IU]/mL (ref 0.35–5.50)

## 2023-08-09 LAB — VITAMIN D 25 HYDROXY (VIT D DEFICIENCY, FRACTURES): VITD: 51.17 ng/mL (ref 30.00–100.00)

## 2023-08-10 ENCOUNTER — Ambulatory Visit: Payer: Self-pay | Admitting: Physician Assistant

## 2023-08-29 ENCOUNTER — Ambulatory Visit: Admitting: Physician Assistant

## 2023-11-13 ENCOUNTER — Ambulatory Visit (INDEPENDENT_AMBULATORY_CARE_PROVIDER_SITE_OTHER): Admitting: *Deleted

## 2023-11-13 ENCOUNTER — Encounter: Payer: Self-pay | Admitting: *Deleted

## 2023-11-13 DIAGNOSIS — M810 Age-related osteoporosis without current pathological fracture: Secondary | ICD-10-CM | POA: Diagnosis not present

## 2023-11-13 DIAGNOSIS — Z23 Encounter for immunization: Secondary | ICD-10-CM | POA: Diagnosis not present

## 2023-11-13 MED ORDER — DENOSUMAB 60 MG/ML ~~LOC~~ SOSY
60.0000 mg | PREFILLED_SYRINGE | SUBCUTANEOUS | Status: AC
Start: 2023-11-13 — End: ?
  Administered 2023-11-13: 60 mg via SUBCUTANEOUS

## 2023-11-13 NOTE — Progress Notes (Signed)
 Per orders of Lucie Buttner, Cassidy Bennett, injection of Prolia  60 mg  given Subcutaneous by Arland Chute, LPN in left arm. Patient tolerated injection well. Patient will make appointment for 6 months.

## 2023-11-21 ENCOUNTER — Encounter: Payer: Self-pay | Admitting: Physician Assistant

## 2023-11-21 ENCOUNTER — Ambulatory Visit (INDEPENDENT_AMBULATORY_CARE_PROVIDER_SITE_OTHER): Admitting: Physician Assistant

## 2023-11-21 VITALS — BP 136/70 | HR 76 | Temp 97.5°F | Ht 62.0 in | Wt 106.4 lb

## 2023-11-21 DIAGNOSIS — R209 Unspecified disturbances of skin sensation: Secondary | ICD-10-CM

## 2023-11-21 DIAGNOSIS — M25572 Pain in left ankle and joints of left foot: Secondary | ICD-10-CM

## 2023-11-21 NOTE — Progress Notes (Signed)
 Cassidy Bennett is a 83 y.o. female here for a follow up of a pre-existing problem.  History of Present Illness:   Chief Complaint  Patient presents with   Ankle Pain    Pt c/o left ankle pain x several months off and on. Taking Tylenol  and Advil with relief.    Left ankle pain Reports medial left ankle pain x several months Reports there is no inciting event A few days ago, pain was so bad she couldn't walk - resolved with tylenol  Has taken tylenol  for a few days now  We did xray of left foot earlier this year for heel pain; she went to physical therapy and symptom(s) improved with time  Cold extremities She also notes that she has very cold left toes Does not have pain in calf/leg with walking No swelling of ankle/legs or discoloration  Past Medical History:  Diagnosis Date   Allergy    Complication of anesthesia    History of TB (tuberculosis)    treated 45 plus years ago last cxr 03-15-18  CXR every 5 years   Hypercholesteremia    denies at preop   Hypertension    Osteoarthritis    Osteoporosis    PONV (postoperative nausea and vomiting)    Post-menopausal    Tuberculosis      Social History   Tobacco Use   Smoking status: Never   Smokeless tobacco: Never  Vaping Use   Vaping status: Never Used  Substance Use Topics   Alcohol use: Not Currently    Alcohol/week: 5.0 standard drinks of alcohol    Types: 5 Glasses of wine per week   Drug use: No    Past Surgical History:  Procedure Laterality Date   CATARACT EXTRACTION  2018   right eye   COLONOSCOPY  2015   DILATION AND CURETTAGE OF UTERUS  2000   KNEE ARTHROSCOPY  2014   left   LUMBAR DISC SURGERY     SHOULDER SURGERY     right   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   Procedure: TOTAL KNEE ARTHROPLASTY;  Surgeon: Melodi Lerner, MD;  Location: [facility];  Service: Orthopedics;  Laterality: Left;    Family History  Problem Relation Age of Onset   Other Mother        malaria   Other Father     Cancer Other        stomach   Breast cancer Neg Hx     Allergies  Allergen Reactions   Metoprolol  Nausea And Vomiting   Morphine  And Codeine Nausea And Vomiting   Other Nausea And Vomiting    Current Medications:   Current Outpatient Medications:    amLODipine  (NORVASC ) 10 MG tablet, Take 1 tablet (10 mg total) by mouth daily., Disp: 90 tablet, Rfl: 1   Ascorbic Acid (VITAMIN C PO), Take 1,000 mg by mouth daily. , Disp: , Rfl:    b complex vitamins tablet, Take 1 tablet by mouth daily. Every other day, Disp: , Rfl:    Calcium Carb-Cholecalciferol (CALCIUM 600 + D PO), Take 1 tablet by mouth daily., Disp: , Rfl:    cholecalciferol (VITAMIN D3) 25 MCG (1000 UNIT) tablet, Take 2,000 Units by mouth daily., Disp: , Rfl:    gabapentin  (NEURONTIN ) 100 MG capsule, Take 100 mg by mouth at bedtime., Disp: , Rfl:    magnesium 30 MG tablet, Take 30 mg by mouth 3 (three) times a week., Disp: , Rfl:    polyethylene glycol (MIRALAX  /  GLYCOLAX ) packet, Take 17 g by mouth daily as needed for moderate constipation. , Disp: , Rfl:    Vitamin A 2400 MCG (8000 UT) TABS, Take 8,000 Units by mouth daily. , Disp: , Rfl:    vitamin B-12 (CYANOCOBALAMIN ) 1000 MCG tablet, Take 1,000 mcg by mouth daily., Disp: , Rfl:    vitamin E 400 UNIT capsule, Take 400 Units by mouth daily., Disp: , Rfl:   Current Facility-Administered Medications:    denosumab  (PROLIA ) injection 60 mg, 60 mg, Subcutaneous, Q6 months, Krysia Zahradnik, [place], [place], 60 mg at 11/13/23 1003   Review of Systems:   Negative unless otherwise specified per HPI.  Vitals:   Vitals:   11/21/23 0951  BP: 136/70  Pulse: 76  Temp: (!) 97.5 F (36.4 C)  TempSrc: Temporal  SpO2: 99%  Weight: 106 lb 6.1 oz (48.3 kg)  Height: 5' 2 (1.575 m)     Body mass index is 19.46 kg/m.  Physical Exam:   Physical Exam Constitutional:      Appearance: Normal appearance. She is well-developed.  HENT:     Head: Normocephalic and atraumatic.  Eyes:      General: Lids are normal.     Extraocular Movements: Extraocular movements intact.     Conjunctiva/sclera: Conjunctivae normal.  Cardiovascular:     Pulses:          Popliteal pulses are 2+ on the right side and 2+ on the left side.       Dorsalis pedis pulses are 2+ on the right side and 2+ on the left side.  Pulmonary:     Effort: Pulmonary effort is normal.  Musculoskeletal:        General: Normal range of motion.     Cervical back: Normal range of motion and neck supple.     Comments: Overall foot and ankle are well aligned, no significant deformity No significant TTP over the base of the 5th metatarsal, navicular Tenderness to palpation to posterior aspect of medial malleolus No significant pain or discomfort with isolated passive forefoot abduction. Inversion, eversion, dorsiflexion and plantar flexion strength 5+/5.    Skin:    General: Skin is warm and dry.  Neurological:     Mental Status: She is alert and oriented to person, place, and time.  Psychiatric:        Attention and Perception: Attention and perception normal.        Mood and Affect: Mood normal.        Behavior: Behavior normal.        Thought Content: Thought content normal.        Judgment: Judgment normal.     Assessment and Plan:   Acute left ankle pain Patient requesting referral to podiatry Will defer imaging to them  Do not feel patient needs to be immobilized -- currently able to bear weight without difficulty   Cold extremities Unclear etiology Will obtain ABIs Follow up based on results   Lucie Buttner, PA-C

## 2023-11-21 NOTE — Patient Instructions (Signed)
 It was great to see you!  We will refer you to podiatry   We will order ultrasound of your legs to assess blood flow  See you in December for your regularly scheduled visits  Take care,  Adonijah Baena PA-C

## 2023-11-28 ENCOUNTER — Ambulatory Visit: Admitting: Podiatry

## 2023-11-28 ENCOUNTER — Ambulatory Visit (INDEPENDENT_AMBULATORY_CARE_PROVIDER_SITE_OTHER)

## 2023-11-28 VITALS — Ht 62.0 in | Wt 106.4 lb

## 2023-11-28 DIAGNOSIS — M7752 Other enthesopathy of left foot: Secondary | ICD-10-CM

## 2023-11-28 DIAGNOSIS — M65972 Unspecified synovitis and tenosynovitis, left ankle and foot: Secondary | ICD-10-CM

## 2023-11-28 MED ORDER — BETAMETHASONE SOD PHOS & ACET 6 (3-3) MG/ML IJ SUSP
3.0000 mg | Freq: Once | INTRAMUSCULAR | Status: AC
Start: 2023-11-28 — End: 2023-11-28
  Administered 2023-11-28: 3 mg via INTRA_ARTICULAR

## 2023-11-28 NOTE — Progress Notes (Signed)
   Chief Complaint  Patient presents with   Ankle Pain    Pt is here due to left ankle pain, states she has been having the pain for a few months, in the last couple of days the swelling has went down and less pain.    Subjective:  83 y.o. female presenting today for evaluation of left ankle pain.  Onset about 2 weeks ago.  No history of injury.  She does walk for exercise on grass and uneven ground.  She believes possibly she could have twisted her ankle slightly  Past Medical History:  Diagnosis Date   Allergy    Complication of anesthesia    History of TB (tuberculosis)    treated 45 plus years ago last cxr 03-15-18  CXR every 5 years   Hypercholesteremia    denies at preop   Hypertension    Osteoarthritis    Osteoporosis    PONV (postoperative nausea and vomiting)    Post-menopausal    Tuberculosis     Past Surgical History:  Procedure Laterality Date   CATARACT EXTRACTION  2018   right eye   COLONOSCOPY  2015   DILATION AND CURETTAGE OF UTERUS  2000   KNEE ARTHROSCOPY  2014   left   LUMBAR DISC SURGERY     SHOULDER SURGERY     right   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 Dermatology:  Skin is warm, dry and supple bilateral lower extremities. Negative for open lesions or macerations. Vascular:  Palpable pedal pulses bilaterally. No edema or erythema noted. Capillary refill within normal limits. Neurological:  Grossly intact via light touch Musculoskeletal Exam:  Pain on palpation to the radial aspect of the patient's left ankle. Mild edema noted. Range of motion within normal limits to all pedal  and ankle joints bilateral. Muscle strength 5/5 in all groups bilateral.   Radiographic Exam LT ankle 11/28/2023:  Normal osseous mineralization. Joint spaces preserved. No fracture/dislocation/boney destruction.  Impression: No acute osseous findings  Assessment: 1.  Synovitis medial aspect of the left ankle  Plan of Care:  -Patient was evaluated. X-Rays reviewed.  -Injection of 0.5 mL Celestone  Soluspan injected in the patient's left ankle. -Okay to slowly increase activity -Recommend good supportive tennis shoes and sneakers -Patient takes aspirin  which seems to help.  Continue PRN -Return to clinic PRN   Thresa EMERSON Sar, DPM [facility]  Dr. Thresa EMERSON Sar, DPM    [address] [place], [place] [address]                Office [phone]  Fax [phone]

## 2023-12-04 ENCOUNTER — Ambulatory Visit (HOSPITAL_COMMUNITY)
Admission: RE | Admit: 2023-12-04 | Discharge: 2023-12-04 | Disposition: A | Discharge status: Home / Self Care | Source: Ambulatory Visit | Attending: Physician Assistant | Admitting: Physician Assistant

## 2023-12-04 DIAGNOSIS — R209 Unspecified disturbances of skin sensation: Secondary | ICD-10-CM | POA: Insufficient documentation

## 2023-12-04 DIAGNOSIS — I739 Peripheral vascular disease, unspecified: Secondary | ICD-10-CM | POA: Insufficient documentation

## 2023-12-04 LAB — VAS US ABI WITH/WO TBI
Left ABI: 0.56
Right ABI: 0.79

## 2023-12-05 ENCOUNTER — Other Ambulatory Visit: Payer: Self-pay | Admitting: *Deleted

## 2023-12-05 ENCOUNTER — Ambulatory Visit: Payer: Self-pay | Admitting: Physician Assistant

## 2023-12-05 DIAGNOSIS — I872 Venous insufficiency (chronic) (peripheral): Secondary | ICD-10-CM

## 2023-12-05 MED ORDER — ATORVASTATIN CALCIUM 20 MG PO TABS: 20.0000 mg | ORAL_TABLET | Freq: Every day | ORAL | 1 refills | Status: AC

## 2023-12-14 DIAGNOSIS — Z23 Encounter for immunization: Secondary | ICD-10-CM | POA: Diagnosis not present

## 2024-02-05 DIAGNOSIS — H04212 Epiphora due to excess lacrimation, left lacrimal gland: Secondary | ICD-10-CM | POA: Diagnosis not present

## 2024-02-05 DIAGNOSIS — H04122 Dry eye syndrome of left lacrimal gland: Secondary | ICD-10-CM | POA: Diagnosis not present

## 2024-02-06 ENCOUNTER — Encounter: Payer: Self-pay | Admitting: Physician Assistant

## 2024-02-06 ENCOUNTER — Ambulatory Visit: Admitting: Physician Assistant

## 2024-02-06 VITALS — BP 130/76 | HR 77 | Temp 97.9°F | Ht 62.0 in | Wt 107.5 lb

## 2024-02-06 DIAGNOSIS — I739 Peripheral vascular disease, unspecified: Secondary | ICD-10-CM

## 2024-02-06 DIAGNOSIS — M810 Age-related osteoporosis without current pathological fracture: Secondary | ICD-10-CM | POA: Diagnosis not present

## 2024-02-06 DIAGNOSIS — I10 Essential (primary) hypertension: Secondary | ICD-10-CM | POA: Diagnosis not present

## 2024-02-06 DIAGNOSIS — E782 Mixed hyperlipidemia: Secondary | ICD-10-CM | POA: Diagnosis not present

## 2024-02-06 DIAGNOSIS — E559 Vitamin D deficiency, unspecified: Secondary | ICD-10-CM

## 2024-02-06 DIAGNOSIS — R7309 Other abnormal glucose: Secondary | ICD-10-CM | POA: Diagnosis not present

## 2024-02-06 LAB — CBC WITH DIFFERENTIAL/PLATELET
Basophils Absolute: 0 K/uL (ref 0.0–0.1)
Basophils Relative: 0.6 % (ref 0.0–3.0)
Eosinophils Absolute: 0 K/uL (ref 0.0–0.7)
Eosinophils Relative: 0.5 % (ref 0.0–5.0)
HCT: 38 % (ref 36.0–46.0)
Hemoglobin: 13 g/dL (ref 12.0–15.0)
Lymphocytes Relative: 22.8 % (ref 12.0–46.0)
Lymphs Abs: 1.3 K/uL (ref 0.7–4.0)
MCHC: 34.3 g/dL (ref 30.0–36.0)
MCV: 79.1 fl (ref 78.0–100.0)
Monocytes Absolute: 0.4 K/uL (ref 0.1–1.0)
Monocytes Relative: 6.8 % (ref 3.0–12.0)
Neutro Abs: 3.9 K/uL (ref 1.4–7.7)
Neutrophils Relative %: 69.3 % (ref 43.0–77.0)
Platelets: 192 K/uL (ref 150.0–400.0)
RBC: 4.81 Mil/uL (ref 3.87–5.11)
RDW: 15 % (ref 11.5–15.5)
WBC: 5.6 K/uL (ref 4.0–10.5)

## 2024-02-06 LAB — COMPREHENSIVE METABOLIC PANEL WITH GFR
ALT: 18 U/L (ref 0–35)
AST: 23 U/L (ref 0–37)
Albumin: 4.6 g/dL (ref 3.5–5.2)
Alkaline Phosphatase: 47 U/L (ref 39–117)
BUN: 15 mg/dL (ref 6–23)
CO2: 25 meq/L (ref 19–32)
Calcium: 9.3 mg/dL (ref 8.4–10.5)
Chloride: 105 meq/L (ref 96–112)
Creatinine, Ser: 0.58 mg/dL (ref 0.40–1.20)
GFR: 83.3 mL/min (ref >60.00)
Glucose, Bld: 100 mg/dL — ABNORMAL HIGH (ref 70–99)
Potassium: 4.1 meq/L (ref 3.5–5.1)
Sodium: 139 meq/L (ref 135–145)
Total Bilirubin: 0.6 mg/dL (ref 0.2–1.2)
Total Protein: 7.4 g/dL (ref 6.0–8.3)

## 2024-02-06 LAB — LIPID PANEL
Cholesterol: 216 mg/dL — ABNORMAL HIGH (ref 0–200)
HDL: 61.9 mg/dL (ref >39.00)
LDL Cholesterol: 102 mg/dL — ABNORMAL HIGH (ref 0–99)
NonHDL: 153.72
Total CHOL/HDL Ratio: 3
Triglycerides: 261 mg/dL — ABNORMAL HIGH (ref 0.0–149.0)
VLDL: 52.2 mg/dL — ABNORMAL HIGH (ref 0.0–40.0)

## 2024-02-06 LAB — HEMOGLOBIN A1C: Hgb A1c MFr Bld: 5.8 % (ref 4.6–6.5)

## 2024-02-06 LAB — VITAMIN D 25 HYDROXY (VIT D DEFICIENCY, FRACTURES): VITD: 54.93 ng/mL (ref 30.00–100.00)

## 2024-02-06 MED ORDER — AMLODIPINE BESYLATE 10 MG PO TABS: 10.0000 mg | ORAL_TABLET | Freq: Every day | ORAL | 1 refills | Status: DC

## 2024-02-06 NOTE — Progress Notes (Signed)
 History of Present Illness:   Chief Complaint  Patient presents with   Medical Management of Chronic Issues    Pt here for 6 month f/u Hypertension, Hyperlipidemia, Osteoporosis    Discussed the use of AI scribe software for clinical note transcription with the patient, who gave verbal consent to proceed.  History of Present Illness   Cassidy Bennett is an 83 year old female with hypertension and peripheral artery disease who presents for medication management and follow-up.  She monitors blood pressure at home with readings 120 to 135 mmHg and needs a refill of her current antihypertensive to avoid running out in 10 days.  She has peripheral artery disease and recently had significant toenail pain that improved with hydration, without toe color changes. She takes atorvastatin  for cholesterol but has been inconsistent and used it for only one week. She takes 81 mg aspirin  daily and vitamin D  after meals. She receives Prolia  injections every six months, with the last dose in March 2025.  She is moving with her husband to an independent living senior apartment that has an on-site gym and drugstore, and she is taking Spanish classes.        Past Medical History:  Diagnosis Date   Allergy    Complication of anesthesia    History of TB (tuberculosis)    treated 45 plus years ago last cxr 03-15-18  CXR every 5 years   Hypercholesteremia    denies at preop   Hypertension    Osteoarthritis    Osteoporosis    PONV (postoperative nausea and vomiting)    Post-menopausal    Tuberculosis      Social History   Tobacco Use   Smoking status: Never   Smokeless tobacco: Never  Vaping Use   Vaping status: Never Used  Substance Use Topics   Alcohol use: Not Currently    Alcohol/week: 5.0 standard drinks of alcohol    Types: 5 Glasses of wine per week   Drug use: No    Past Surgical History:  Procedure Laterality Date   CATARACT EXTRACTION  2018   right eye   COLONOSCOPY   2015   DILATION AND CURETTAGE OF UTERUS  2000   KNEE ARTHROSCOPY  2014   left   LUMBAR DISC SURGERY     SHOULDER SURGERY     right   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 , Disp: , Rfl:    aspirin  EC 81 MG tablet, Take 81 mg by mouth daily. Swallow whole., Disp: , Rfl:    atorvastatin  (LIPITOR) 20 MG tablet, Take 1 tablet (20 mg total) by mouth daily., Disp: 90 tablet, Rfl: 1   b complex vitamins tablet, Take 1 tablet by mouth daily. Every other day, Disp: , Rfl:    Calcium  Carb-Cholecalciferol (CALCIUM  600 +  D PO), Take 1 tablet by mouth daily., Disp: , Rfl:    cholecalciferol (VITAMIN D3) 25 MCG (1000 UNIT) tablet, Take 2,000 Units by mouth daily., Disp: , Rfl:    gabapentin  (NEURONTIN ) 100 MG capsule, Take 100 mg by mouth at bedtime., Disp: , Rfl:    magnesium 30 MG tablet, Take 30 mg by mouth 3 (three) times a week. (Patient taking differently: Take 30 mg by mouth daily in the afternoon.), Disp: , Rfl:    polyethylene glycol (MIRALAX  / GLYCOLAX ) packet, Take 17 g by mouth daily as needed for moderate constipation. , Disp: , Rfl:    Vitamin A 2400 MCG (8000 UT) TABS, Take 8,000 Units by mouth daily. , Disp: , Rfl:    vitamin B-12 (CYANOCOBALAMIN ) 1000 MCG tablet, Take 1,000 mcg by mouth daily., Disp: , Rfl:    vitamin E 400 UNIT capsule, Take 400 Units by mouth daily., Disp: , Rfl:    amLODipine  (NORVASC ) 10 MG tablet, Take 1 tablet (10 mg  total) by mouth daily., Disp: 90 tablet, Rfl: 1  Current Facility-Administered Medications:    denosumab  (PROLIA ) injection 60 mg, 60 mg, Subcutaneous, Q6 months, Samreen Seltzer, [place], [place], 60 mg at 11/13/23 1003   Review of Systems:   Negative unless otherwise specified per HPI.  Vitals:   Vitals:   02/06/24 1007  BP: 130/76  Pulse: 77  Temp: 97.9 F (36.6 C)  TempSrc: Temporal  SpO2: 99%  Weight: 107 lb 8 oz (48.8 kg)  Height: 5' 2 (1.575 m)     Body mass index is 19.66 kg/m.  Physical Exam:   Physical Exam Vitals and nursing note reviewed.  Constitutional:      General: She is not in acute distress.    Appearance: She is well-developed. She is not ill-appearing or toxic-appearing.  Cardiovascular:     Rate and Rhythm: Normal rate and regular rhythm.     Pulses: Normal pulses.     Heart sounds: Normal heart sounds, S1 normal and S2 normal.  Pulmonary:     Effort: Pulmonary effort is normal.     Breath sounds: Normal breath sounds.  Skin:    General: Skin is warm and dry.  Neurological:     Mental Status: She is alert.     GCS: GCS eye subscore is 4. GCS verbal subscore is 5. GCS motor subscore is 6.  Psychiatric:        Speech: Speech normal.        Behavior: Behavior normal. Behavior is cooperative.     Assessment and Plan:   Assessment and Plan    Peripheral vascular disease of the lower extremities Chronic condition with reduced blood flow due to plaque buildup, causing intermittent claudication. Toenail pain resolved. - Continue aspirin  81 mg daily. - Encouraged adherence to atorvastatin . - Scheduled follow-up in February.  Essential hypertension Blood pressure well-controlled with current regimen. - Refilled amlodipine  10 mg daily prescription.  Osteoporosis without current pathological fracture, unspecified osteoporosis type  Managed with Prolia  injections. - Scheduled Prolia  injection in March. - Coordinated injection with office  visits  Vitamin D  deficiency Vitamin D  level remains suboptimal despite daily supplementation. - Checked vitamin D  levels.  Mixed Hyperlipidemia Managed with atorvastatin . Discussed importance of adherence due to its role in managing peripheral vascular disease. - Encouraged consistent use of atorvastatin . - Will check cholesterol levels at next visit.  General Health Maintenance Routine health maintenance discussed. - Will recheck hemoglobin A1c. - Continue vitamin D  supplementation.  Lucie Buttner, PA-C

## 2024-02-06 NOTE — Patient Instructions (Signed)
 It was great to see you!  Please follow up anytime after March 16th for your Prolia  shot AND we will also complete your exams at these visit as well, and continues to see you every 6 months.  Please take cholesterol medication every day to prevent worsening plaque build-up in your legs.  Let's follow-up in 6 months, sooner if you have concerns.  Take care,  Lucie Buttner PA-C

## 2024-02-07 ENCOUNTER — Ambulatory Visit: Payer: Self-pay | Admitting: Physician Assistant

## 2024-02-21 ENCOUNTER — Other Ambulatory Visit: Payer: Self-pay | Admitting: Physician Assistant

## 2024-02-21 DIAGNOSIS — I10 Essential (primary) hypertension: Secondary | ICD-10-CM

## 2024-03-20 ENCOUNTER — Other Ambulatory Visit: Payer: Self-pay | Admitting: Vascular Surgery

## 2024-03-20 DIAGNOSIS — M7989 Other specified soft tissue disorders: Secondary | ICD-10-CM

## 2024-04-10 ENCOUNTER — Ambulatory Visit (HOSPITAL_COMMUNITY)

## 2024-04-10 ENCOUNTER — Encounter

## 2024-05-14 ENCOUNTER — Ambulatory Visit: Admitting: Physician Assistant
